# Patient Record
Sex: Female | Born: 1935 | Race: White | Hispanic: No | Marital: Single | Smoking: Never smoker
Health system: Southern US, Community
[De-identification: ages and names within clinical notes are randomized; demographics above are authoritative.]

## PROBLEM LIST (undated history)

## (undated) DIAGNOSIS — C50919 Malignant neoplasm of unspecified site of unspecified female breast: Secondary | ICD-10-CM

## (undated) DIAGNOSIS — I10 Essential (primary) hypertension: Secondary | ICD-10-CM

## (undated) DIAGNOSIS — D689 Coagulation defect, unspecified: Secondary | ICD-10-CM

## (undated) DIAGNOSIS — E785 Hyperlipidemia, unspecified: Secondary | ICD-10-CM

## (undated) DIAGNOSIS — H269 Unspecified cataract: Secondary | ICD-10-CM

## (undated) DIAGNOSIS — J189 Pneumonia, unspecified organism: Secondary | ICD-10-CM

## (undated) DIAGNOSIS — E119 Type 2 diabetes mellitus without complications: Secondary | ICD-10-CM

## (undated) DIAGNOSIS — K219 Gastro-esophageal reflux disease without esophagitis: Secondary | ICD-10-CM

## (undated) DIAGNOSIS — K635 Polyp of colon: Secondary | ICD-10-CM

## (undated) DIAGNOSIS — D126 Benign neoplasm of colon, unspecified: Secondary | ICD-10-CM

## (undated) DIAGNOSIS — H409 Unspecified glaucoma: Secondary | ICD-10-CM

## (undated) DIAGNOSIS — I2699 Other pulmonary embolism without acute cor pulmonale: Secondary | ICD-10-CM

## (undated) DIAGNOSIS — J45909 Unspecified asthma, uncomplicated: Secondary | ICD-10-CM

## (undated) DIAGNOSIS — N189 Chronic kidney disease, unspecified: Secondary | ICD-10-CM

## (undated) DIAGNOSIS — E1169 Type 2 diabetes mellitus with other specified complication: Secondary | ICD-10-CM

## (undated) DIAGNOSIS — M810 Age-related osteoporosis without current pathological fracture: Secondary | ICD-10-CM

## (undated) HISTORY — PX: SKIN CANCER EXCISION: SHX779

## (undated) HISTORY — DX: Chronic kidney disease, unspecified: N18.9

## (undated) HISTORY — DX: Hyperlipidemia, unspecified: E78.5

## (undated) HISTORY — DX: Coagulation defect, unspecified: D68.9

## (undated) HISTORY — DX: Unspecified glaucoma: H40.9

## (undated) HISTORY — DX: Type 2 diabetes mellitus without complications: E11.9

## (undated) HISTORY — DX: Other pulmonary embolism without acute cor pulmonale: I26.99

## (undated) HISTORY — PX: APPENDECTOMY: SHX54

## (undated) HISTORY — PX: TONSILLECTOMY: SUR1361

## (undated) HISTORY — DX: Type 2 diabetes mellitus with other specified complication: E11.69

## (undated) HISTORY — DX: Essential (primary) hypertension: I10

## (undated) HISTORY — DX: Age-related osteoporosis without current pathological fracture: M81.0

## (undated) HISTORY — DX: Gastro-esophageal reflux disease without esophagitis: K21.9

## (undated) HISTORY — PX: CATARACT EXTRACTION, BILATERAL: SHX1313

## (undated) HISTORY — PX: MASTECTOMY: SHX3

## (undated) HISTORY — DX: Pneumonia, unspecified organism: J18.9

## (undated) HISTORY — DX: Polyp of colon: K63.5

## (undated) HISTORY — DX: Malignant neoplasm of unspecified site of unspecified female breast: C50.919

## (undated) HISTORY — DX: Unspecified asthma, uncomplicated: J45.909

## (undated) HISTORY — DX: Unspecified cataract: H26.9

---
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
## 1979-10-21 HISTORY — PX: VAGINAL HYSTERECTOMY: SUR661

## 2014-10-18 DIAGNOSIS — C4492 Squamous cell carcinoma of skin, unspecified: Secondary | ICD-10-CM

## 2014-10-18 HISTORY — DX: Squamous cell carcinoma of skin, unspecified: C44.92

## 2015-10-21 DIAGNOSIS — D126 Benign neoplasm of colon, unspecified: Secondary | ICD-10-CM

## 2015-10-21 HISTORY — DX: Benign neoplasm of colon, unspecified: D12.6

## 2016-04-11 ENCOUNTER — Telehealth: Payer: Self-pay | Admitting: Gastroenterology

## 2016-04-11 NOTE — Telephone Encounter (Signed)
Received GI records from Dr. West Carbo. Patient states that she is due for another colonoscopy and patient is requesting to see Dr. Fuller Plan. Records placed on Dr. Lynne Leader desk for review.

## 2016-04-14 ENCOUNTER — Encounter: Payer: Self-pay | Admitting: Gastroenterology

## 2016-04-14 NOTE — Telephone Encounter (Signed)
Dr. Fuller Plan reviewed records and has accepted patient. Ok to schedule Direct Colon. Colonoscopy scheduled.

## 2016-06-12 ENCOUNTER — Ambulatory Visit (AMBULATORY_SURGERY_CENTER): Payer: Self-pay

## 2016-06-12 VITALS — Ht 65.0 in | Wt 178.8 lb

## 2016-06-12 DIAGNOSIS — Z860100 Personal history of colon polyps, unspecified: Secondary | ICD-10-CM

## 2016-06-12 MED ORDER — SUPREP BOWEL PREP KIT 17.5-3.13-1.6 GM/177ML PO SOLN: 1.0000 | Freq: Once | ORAL | 0 refills | Status: AC

## 2016-06-12 NOTE — Progress Notes (Signed)
No allergies to eggs or soy No diet meds No home oxygen No past problems with anesthesia  No internet

## 2016-06-26 ENCOUNTER — Ambulatory Visit (AMBULATORY_SURGERY_CENTER): Payer: Medicare Other | Admitting: Gastroenterology

## 2016-06-26 ENCOUNTER — Encounter: Payer: Self-pay | Admitting: Gastroenterology

## 2016-06-26 VITALS — BP 131/41 | HR 62 | Temp 96.0°F | Resp 19 | Ht 65.0 in | Wt 178.0 lb

## 2016-06-26 DIAGNOSIS — D122 Benign neoplasm of ascending colon: Secondary | ICD-10-CM

## 2016-06-26 DIAGNOSIS — D128 Benign neoplasm of rectum: Secondary | ICD-10-CM

## 2016-06-26 DIAGNOSIS — Z86010 Personal history of colonic polyps: Secondary | ICD-10-CM | POA: Diagnosis present

## 2016-06-26 DIAGNOSIS — K621 Rectal polyp: Secondary | ICD-10-CM

## 2016-06-26 DIAGNOSIS — D129 Benign neoplasm of anus and anal canal: Secondary | ICD-10-CM

## 2016-06-26 MED ORDER — SODIUM CHLORIDE 0.9 % IV SOLN: 500.0000 mL | INTRAVENOUS | Status: DC

## 2016-06-26 NOTE — Progress Notes (Signed)
Called to room to assist during endoscopic procedure.  Patient ID and intended procedure confirmed with present staff. Received instructions for my participation in the procedure from the performing physician.  

## 2016-06-26 NOTE — Progress Notes (Signed)
Report to PACU, RN, vss, BBS= Clear.  

## 2016-06-26 NOTE — Progress Notes (Signed)
Pt passed a little gas in the recovery room.  Her abdomen is soft and she denies any pain.  Per Dr. Fuller Plan ok to d/c pt to home.  Pt will ambulate, drink warm fluids and bearing down to pass flatus.  maw

## 2016-06-26 NOTE — Patient Instructions (Signed)
YOU HAD AN ENDOSCOPIC PROCEDURE TODAY AT [facility]:   Refer to the procedure report that was given to you for any specific questions about what was found during the examination.  If the procedure report does not answer your questions, please call your gastroenterologist to clarify.  If you requested that your care partner not be given the details of your procedure findings, then the procedure report has been included in a sealed envelope for you to review at your convenience later.  YOU SHOULD EXPECT: Some feelings of bloating in the abdomen. Passage of more gas than usual.  Walking can help get rid of the air that was put into your GI tract during the procedure and reduce the bloating. If you had a lower endoscopy (such as a colonoscopy or flexible sigmoidoscopy) you may notice spotting of blood in your stool or on the toilet paper. If you underwent a bowel prep for your procedure, you may not have a normal bowel movement for a few days.  Please Note:  You might notice some irritation and congestion in your nose or some drainage.  This is from the oxygen used during your procedure.  There is no need for concern and it should clear up in a day or so.  SYMPTOMS TO REPORT IMMEDIATELY:   Following lower endoscopy (colonoscopy or flexible sigmoidoscopy):  Excessive amounts of blood in the stool  Significant tenderness or worsening of abdominal pains  Swelling of the abdomen that is new, acute  Fever of 100F or higher   Following upper endoscopy (EGD)  Vomiting of blood or coffee ground material  New chest pain or pain under the shoulder blades  Painful or persistently difficult swallowing  New shortness of breath  Fever of 100F or higher  Black, tarry-looking stools  For urgent or emergent issues, a gastroenterologist can be reached at any hour by calling [phone].   DIET:  We do recommend a small meal at first, but then you may proceed to your regular diet.  Drink  plenty of fluids but you should avoid alcoholic beverages for 24 hours.  ACTIVITY:  You should plan to take it easy for the rest of today and you should NOT DRIVE or use heavy machinery until tomorrow (because of the sedation medicines used during the test).    FOLLOW UP: Our staff will call the number listed on your records the next business day following your procedure to check on you and address any questions or concerns that you may have regarding the information given to you following your procedure. If we do not reach you, we will leave a message.  However, if you are feeling well and you are not experiencing any problems, there is no need to return our call.  We will assume that you have returned to your regular daily activities without incident.  If any biopsies were taken you will be contacted by phone or by letter within the next 1-3 weeks.  Please call us at [phone] if you have not heard about the biopsies in 3 weeks.    SIGNATURES/CONFIDENTIALITY: You and/or your care partner have signed paperwork which will be entered into your electronic medical record.  These signatures attest to the fact that that the information above on your After Visit Summary has been reviewed and is understood.  Full responsibility of the confidentiality of this discharge information lies with you and/or your care-partner.   Handout was given to your care partner on polyps. No  aspirin, aspirin products,  ibuprofen, naproxen, advil, motrin, aleve, or other non-steroidal anti-inflammatory drugs for 14 days after polyp removal. You may resume your other current medications today. Await biopsy results. Please call if any questions or concerns.

## 2016-06-26 NOTE — Op Note (Signed)
[facility] Patient Name: Kristen Fox Procedure Date: 06/26/2016 9:07 AM MRN: [id number] Endoscopist: Ladene Artist , MD Age: 82 Referring MD:  Date of Birth: 1934-04-06 Gender: Female Account #: [id number] Procedure:                Colonoscopy Indications:              High risk colon cancer surveillance: Personal                            history of adenoma (10 mm or greater in size) Medicines:                Monitored Anesthesia Care Procedure:                Pre-Anesthesia Assessment:                           - Prior to the procedure, a History and Physical                            was performed, and patient medications and                            allergies were reviewed. The patient's tolerance of                            previous anesthesia was also reviewed. The risks                            and benefits of the procedure and the sedation                            options and risks were discussed with the patient.                            All questions were answered, and informed consent                            was obtained. Prior Anticoagulants: The patient has                            taken no previous anticoagulant or antiplatelet                            agents. ASA Grade Assessment: II - A patient with                            mild systemic disease. After reviewing the risks                            and benefits, the patient was deemed in                            satisfactory condition to undergo the procedure.  After obtaining informed consent, the colonoscope                            was passed under direct vision. Throughout the                            procedure, the patient's blood pressure, pulse, and                            oxygen saturations were monitored continuously. The                            Model PCF-H190L [id number]) scope was introduced                            through the anus  and advanced to the the cecum,                            identified by appendiceal orifice and ileocecal                            valve. The ileocecal valve, appendiceal orifice,                            and rectum were photographed. The quality of the                            bowel preparation was excellent. The colonoscopy                            was performed without difficulty. The patient                            tolerated the procedure well. Scope In: 9:15:18 AM Scope Out: 9:29:54 AM Scope Withdrawal Time: 0 hours 11 minutes 58 seconds  Total Procedure Duration: 0 hours 14 minutes 36 seconds  Findings:                 A 22 mm polyp was found in the ascending colon                            immediately distal to the IC valve. The polyp was                            sessile. The polyp was removed with a hot snare.                            Resection and retrieval were complete. Area was                            tattooed with an injection of 2 mL of Spot (carbon  black) immediately distal to the polyectomy site.                           A 5 mm polyp was found in the rectum. The polyp was                            sessile. The polyp was removed with a cold snare.                            Resection and retrieval were complete.                           A 5 mm scar was found in the rectum. The scar was                            unremarkable in appearance.                           A single small localized angiodysplastic lesion                            without bleeding was found in the transverse colon.                           The exam was otherwise normal throughout the                            examined colon.                           The retroflexed view of the distal rectum and anal                            verge was normal and showed no anal or rectal                            abnormalities. Complications:            No  immediate complications. Estimated blood loss:                            None. Estimated Blood Loss:     Estimated blood loss: none. Impression:               - One 22 mm polyp in the ascending colon, removed                            with a hot snare. Resected and retrieved. Tattooed.                           - One 5 mm polyp in the rectum, removed with a cold                            snare. Resected and retrieved.                           -  Scar in the rectum.                           - A single non-bleeding colonic angiodysplastic                            lesion.                           - The distal rectum and anal verge are normal on                            retroflexion view. Recommendation:           - Repeat colonoscopy in 3 years for surveillance                            pending path review and review patients health                            status.                           - Patient has a contact number available for                            emergencies. The signs and symptoms of potential                            delayed complications were discussed with the                            patient. Return to normal activities tomorrow.                            Written discharge instructions were provided to the                            patient.                           - Resume previous diet.                           - Continue present medications.                           - Await pathology results.                           - No aspirin, ibuprofen, naproxen, or other                            non-steroidal anti-inflammatory drugs for 2 weeks                            after polyp removal. Ladene Artist, MD 06/26/2016 9:36:57 AM This  report has been signed electronically.

## 2016-06-26 NOTE — Progress Notes (Signed)
No problems noted in the recovery room. maw 

## 2016-06-27 ENCOUNTER — Telehealth: Payer: Self-pay | Admitting: *Deleted

## 2016-06-27 NOTE — Telephone Encounter (Signed)
  Follow up Call-  Call back number 06/26/2016  Post procedure Call Back phone  # [phone]  Permission to leave phone message Yes     Patient questions:  Do you have a fever, pain , or abdominal swelling? No. Pain Score  0 *  Have you tolerated food without any problems? Yes.    Have you been able to return to your normal activities? Yes.    Do you have any questions about your discharge instructions: Diet   No. Medications  No. Follow up visit  No.  Do you have questions or concerns about your Care? No.  Actions: * If pain score is 4 or above: No action needed, pain <4.

## 2016-07-02 ENCOUNTER — Encounter: Payer: Self-pay | Admitting: Gastroenterology

## 2017-03-17 ENCOUNTER — Other Ambulatory Visit: Payer: Self-pay | Admitting: Physician Assistant

## 2019-05-30 ENCOUNTER — Encounter: Payer: Self-pay | Admitting: Gastroenterology

## 2019-06-02 ENCOUNTER — Other Ambulatory Visit: Payer: Self-pay | Admitting: Physician Assistant

## 2019-07-21 ENCOUNTER — Ambulatory Visit (INDEPENDENT_AMBULATORY_CARE_PROVIDER_SITE_OTHER): Payer: Medicare Other | Admitting: Gastroenterology

## 2019-07-21 ENCOUNTER — Encounter: Payer: Self-pay | Admitting: Gastroenterology

## 2019-07-21 ENCOUNTER — Telehealth: Payer: Self-pay

## 2019-07-21 VITALS — BP 156/72 | HR 75 | Temp 98.5°F | Ht 65.0 in | Wt 167.0 lb

## 2019-07-21 DIAGNOSIS — Z7901 Long term (current) use of anticoagulants: Secondary | ICD-10-CM | POA: Diagnosis not present

## 2019-07-21 DIAGNOSIS — Z86010 Personal history of colonic polyps: Secondary | ICD-10-CM

## 2019-07-21 MED ORDER — NA SULFATE-K SULFATE-MG SULF 17.5-3.13-1.6 GM/177ML PO SOLN: 1.0000 | Freq: Once | ORAL | 0 refills | Status: AC

## 2019-07-21 NOTE — Progress Notes (Signed)
History of Present Illness: This is an 85 year old female referred by Josem Kaufmann, MD for the evaluation of a personal history of adenomatous colon polyps.  She has had adenomatous polyps of 10 mm found in the past and 22 mm on her most recent colonoscopy.  She has no gastrointestinal complaints.  She is currently maintained on Eliquis for history of a DVT and PE in January.  She has been stable for the past several months.  She followed by hematologist oncologist in [place], [place].   Colonoscopy 06/2016 - One 22 mm polyp in the ascending colon, removed with a hot snare. Resected and retrieved. Tattooed. Tubular adenoma.  - One 5 mm polyp in the rectum, removed with a cold snare. Resected and retrieved. Hyperplastic.  - Scar in the rectum. - A single non-bleeding colonic angiodysplastic lesion. - The distal rectum and anal verge are normal on retroflexion view.   No Known Allergies Outpatient Medications Prior to Visit  Medication Sig Dispense Refill  . amLODipine (NORVASC) 5 MG tablet Take 5 mg by mouth daily.    . brimonidine (ALPHAGAN) 0.2 % ophthalmic solution 3 (three) times daily.    . Calcium Carbonate-Vitamin D (CALCIUM 500 + D) 500-125 MG-UNIT TABS Take by mouth.    . cholecalciferol (VITAMIN D) 1000 units tablet Take 1,000 Units by mouth daily.    Marland Kitchen denosumab (PROLIA) 60 MG/ML SOLN injection Inject 60 mg into the skin every 6 (six) months. Administer in upper arm, thigh, or abdomen    . esomeprazole (NEXIUM) 20 MG packet Take 20 mg by mouth daily before breakfast.    . lisinopril-hydrochlorothiazide (PRINZIDE,ZESTORETIC) 20-25 MG tablet Take 1 tablet by mouth daily.    . montelukast (SINGULAIR) 10 MG tablet Take 10 mg by mouth at bedtime.    Marland Kitchen glyBURIDE micronized (GLYNASE) 6 MG tablet Take 6 mg by mouth 2 (two) times daily before a meal.    . insulin glargine (LANTUS) 100 UNIT/ML injection Inject 40 Units into the skin at bedtime.    . Insulin Lispro (HUMALOG KWIKPEN [place])  Inject 5 Units into the skin.    Marland Kitchen Potassium 99 MG TABS Take by mouth.     Facility-Administered Medications Prior to Visit  Medication Dose Route Frequency Provider Last Rate Last Dose  . 0.9 %  sodium chloride infusion  500 mL Intravenous Continuous Ladene Artist, MD       Past Medical History:  Diagnosis Date  . Asthma due to environmental allergies   . Breast cancer ([facility])    2003  . Diabetes mellitus ([place] [facility])    type II  . Diabetic coma ([place])   . GERD (gastroesophageal reflux disease)   . Glaucoma   . Hypertension   . Osteoporosis   . Pneumonia   . Pulmonary embolism ([place])   . Tubular adenoma of colon 2017   Past Surgical History:  Procedure Laterality Date  . APPENDECTOMY    . CATARACT EXTRACTION, BILATERAL    . MASTECTOMY     left w/ymph node removal  . SKIN CANCER EXCISION    . TONSILLECTOMY    . VAGINAL HYSTERECTOMY  1981   Social History   Socioeconomic History  . Marital status: Single    Spouse name: Not on file  . Number of children: Not on file  . Years of education: Not on file  . Highest education level: Not on file  Occupational History  . Not on file  Social Needs  .  Financial resource strain: Not on file  . Food insecurity    Worry: Not on file    Inability: Not on file  . Transportation needs    Medical: Not on file    Non-medical: Not on file  Tobacco Use  . Smoking status: Never Smoker  . Smokeless tobacco: Never Used  Substance and Sexual Activity  . Alcohol use: No  . Drug use: No  . Sexual activity: Not on file  Lifestyle  . Physical activity    Days per week: Not on file    Minutes per session: Not on file  . Stress: Not on file  Relationships  . Social Herbalist on phone: Not on file    Gets together: Not on file    Attends religious service: Not on file    Active member of club or organization: Not on file    Attends meetings of clubs or organizations: Not on file    Relationship status: Not on file  Other  Topics Concern  . Not on file  Social History Narrative  . Not on file   Family History  Problem Relation Age of Onset  . Diabetes Mother   . CVA Mother   . Leukemia Father   . Colon cancer Neg Hx   . Stomach cancer Neg Hx   . Pancreatic cancer Neg Hx   . Esophageal cancer Neg Hx        Review of Systems: Pertinent positive and negative review of systems were noted in the above HPI section. All other review of systems were otherwise negative.   Physical Exam: General: Well developed, well nourished, no acute distress Head: Normocephalic and atraumatic Eyes:  sclerae anicteric, EOMI Ears: Normal auditory acuity Mouth: No deformity or lesions Neck: Supple, no masses or thyromegaly Lungs: Clear throughout to auscultation Heart: Regular rate and rhythm; no murmurs, rubs or bruits Abdomen: Soft, non tender and non distended. No masses, hepatosplenomegaly or hernias noted. Normal Bowel sounds Rectal: deferred to colonoscopy Musculoskeletal: Symmetrical with no gross deformities  Skin: No lesions on visible extremities Pulses:  Normal pulses noted Extremities: No clubbing, cyanosis, edema or deformities noted Neurological: Alert oriented x 4, grossly nonfocal Cervical Nodes:  No significant cervical adenopathy Inguinal Nodes: No significant inguinal adenopathy Psychological:  Alert and cooperative. Normal mood and affect   Assessment and Recommendations:  1. Personal history of larger adenomatous colon polyps.  Recommend surveillance colonoscopy. Schedule colonoscopy. The risks (including bleeding, perforation, infection, missed lesions, medication reactions and possible hospitalization or surgery if complications occur), benefits, and alternatives to colonoscopy with possible biopsy and possible polypectomy were discussed with the patient and they consent to proceed.  Timing may need to be adjusted pending clearance to temporarily hold Eliquis.    2.  History of PE and DVT.   Hold Eliquis 2 days before procedure - will instruct when and how to resume after procedure. Low but real risk of cardiovascular event such as heart attack, stroke, embolism, thrombosis or ischemia/infarct of other organs off Eliquis explained and need to seek urgent help if this occurs. The patient consents to proceed. Will communicate by phone or EMR with patient's prescribing provider to confirm that holding Eliquis is reasonable in this case.     cc: Josem Kaufmann, MD [address] [place] [address]

## 2019-07-21 NOTE — Telephone Encounter (Signed)
Faxed clearance to Dr. Junius Roads at [facility] oncology [phone]) Attention Manuela Schwartz. Waiting on fax clearance back.

## 2019-07-21 NOTE — Patient Instructions (Signed)
You have been scheduled for a colonoscopy. Please follow written instructions given to you at your visit today.  Please pick up your prep supplies at the pharmacy within the next 1-3 days. If you use inhalers (even only as needed), please bring them with you on the day of your procedure. Your physician has requested that you go to www.startemmi.com and enter the access code given to you at your visit today. This web site gives a general overview about your procedure. However, you should still follow specific instructions given to you by our office regarding your preparation for the procedure.  Thank you for choosing me and [facility].  Malcolm T. Stark, Jr., MD., FACG  

## 2019-07-22 ENCOUNTER — Telehealth: Payer: Self-pay | Admitting: Gastroenterology

## 2019-07-22 NOTE — Telephone Encounter (Signed)
Informed patient per Dr. Junius Roads to hold Eliquis 3 days prior to her procedure. Patient verbalized understanding.

## 2019-07-22 NOTE — Telephone Encounter (Signed)
Pt called again, she is requesting a sample of suprep, she would like an answer today because one of her relatives is in [place] today and can pick it up at the office. Pls call her.

## 2019-07-22 NOTE — Telephone Encounter (Signed)
Informed patient we do not have a sample of Suprep at this time but we can contact her when we get a sample in the office. Patient verbalized understanding.

## 2019-07-22 NOTE — Telephone Encounter (Signed)
Received fax back from Dr. Junius Roads stating he would like patient to hold her Eliquis 3 days prior to her colonoscopy. Is this ok Dr. Fuller Plan?

## 2019-07-22 NOTE — Telephone Encounter (Signed)
OK with me.

## 2019-08-01 ENCOUNTER — Telehealth: Payer: Self-pay | Admitting: Gastroenterology

## 2019-08-01 NOTE — Telephone Encounter (Signed)
See previous phone note. Informed patient that we have not received samples of Suprep yet but will contact her when we do.

## 2019-08-04 NOTE — Telephone Encounter (Signed)
Patient called back and states she cannot get any one to pick up the prep until 08/10/19 and needs to reschedule the procedure. Rescheduled Colon until 08/11/19 at 11:30am. Informed patient I will still contact her when her prep comes in.

## 2019-08-04 NOTE — Telephone Encounter (Signed)
Informed patient that drug rep for Suprep states the sample free preps will not be delivered until Friday afternoon at the earliest so it could be Friday evening or Monday before someone can pick it up. Patient states she might reschedule the Colonoscopy since she is not sure if she can get her daughter to come pick it up Friday or Monday. Informed patient that's her choice but we can give her a sample if she can find someone to pick up the prep. Also, we could give her a new prep with different instructions but still has to have someone to get the new instructions. Patient states she will talk with her daughter and to call her tomorrow when the prep samples come in.

## 2019-08-04 NOTE — Telephone Encounter (Signed)
Left message for patient to return my call.

## 2019-08-05 NOTE — Telephone Encounter (Signed)
Informed patient that her sample will be at the front desk when she or a family member can pick it up. Patient verbalized understanding.

## 2019-08-09 ENCOUNTER — Encounter: Payer: Medicare Other | Admitting: Gastroenterology

## 2019-08-10 ENCOUNTER — Telehealth: Payer: Self-pay | Admitting: Gastroenterology

## 2019-08-10 NOTE — Telephone Encounter (Signed)

## 2019-08-11 ENCOUNTER — Encounter: Payer: Self-pay | Admitting: Gastroenterology

## 2019-08-11 ENCOUNTER — Ambulatory Visit: Payer: Medicare Other | Admitting: Gastroenterology

## 2019-08-11 ENCOUNTER — Encounter: Payer: Self-pay | Admitting: *Deleted

## 2019-08-11 ENCOUNTER — Other Ambulatory Visit: Payer: Self-pay

## 2019-08-11 VITALS — BP 154/65 | HR 54 | Temp 98.5°F | Ht 65.0 in | Wt 167.0 lb

## 2019-08-11 MED ORDER — SODIUM CHLORIDE 0.9 % IV SOLN: 500.0000 mL | Freq: Once | INTRAVENOUS | Status: DC

## 2019-08-11 NOTE — Progress Notes (Signed)
Pt's states no medical or surgical changes since previsit or office visit.  Vs by CW in [place] covid questions by JB ,front desk

## 2019-08-17 ENCOUNTER — Telehealth: Payer: Self-pay

## 2019-08-17 NOTE — Telephone Encounter (Signed)
Covid-19 screening questions   Do you now or have you had a fever in the last 14 days? NO   Do you have any respiratory symptoms of shortness of breath or cough now or in the last 14 days? NO  Do you have any family members or close contacts with diagnosed or suspected Covid-19 in the past 14 days? NO  Have you been tested for Covid-19 and found to be positive? NO        

## 2019-08-18 ENCOUNTER — Other Ambulatory Visit: Payer: Self-pay | Admitting: Gastroenterology

## 2019-08-18 ENCOUNTER — Encounter: Payer: Self-pay | Admitting: Gastroenterology

## 2019-08-18 ENCOUNTER — Ambulatory Visit (AMBULATORY_SURGERY_CENTER): Payer: Medicare Other | Admitting: Gastroenterology

## 2019-08-18 ENCOUNTER — Other Ambulatory Visit: Payer: Self-pay

## 2019-08-18 VITALS — BP 148/88 | HR 58 | Temp 98.3°F | Resp 14 | Ht 65.0 in | Wt 167.0 lb

## 2019-08-18 DIAGNOSIS — Z86010 Personal history of colonic polyps: Secondary | ICD-10-CM

## 2019-08-18 DIAGNOSIS — D120 Benign neoplasm of cecum: Secondary | ICD-10-CM | POA: Diagnosis not present

## 2019-08-18 MED ORDER — SODIUM CHLORIDE 0.9 % IV SOLN: 500.0000 mL | INTRAVENOUS | Status: DC

## 2019-08-18 NOTE — Patient Instructions (Signed)
Thank you for allowing [place] to care for you today!  Resume your Eliquis in 2 days (Saturday 10/31) at prior dose.   Avoid NSAIDS ( Ibuprofen, Aspirin, Naproxen) for 2 weeks.   ( November 13)  No more routine colonoscopies due to current age guidelines.  Don't hesitate to contact Dr Fuller Plan if any symptoms or issues arise.  Resume previous diet and medications today.  Return to your normal activities tomorrow.   YOU HAD AN ENDOSCOPIC PROCEDURE TODAY AT [facility]:   Refer to the procedure report that was given to you for any specific questions about what was found during the examination.  If the procedure report does not answer your questions, please call your gastroenterologist to clarify.  If you requested that your care partner not be given the details of your procedure findings, then the procedure report has been included in a sealed envelope for you to review at your convenience later.  YOU SHOULD EXPECT: Some feelings of bloating in the abdomen. Passage of more gas than usual.  Walking can help get rid of the air that was put into your GI tract during the procedure and reduce the bloating. If you had a lower endoscopy (such as a colonoscopy or flexible sigmoidoscopy) you may notice spotting of blood in your stool or on the toilet paper. If you underwent a bowel prep for your procedure, you may not have a normal bowel movement for a few days.  Please Note:  You might notice some irritation and congestion in your nose or some drainage.  This is from the oxygen used during your procedure.  There is no need for concern and it should clear up in a day or so.  SYMPTOMS TO REPORT IMMEDIATELY:   Following lower endoscopy (colonoscopy or flexible sigmoidoscopy):  Excessive amounts of blood in the stool  Significant tenderness or worsening of abdominal pains  Swelling of the abdomen that is new, acute  Fever of 100F or higher   For urgent or emergent issues, a gastroenterologist  can be reached at any hour by calling [phone].   DIET:  We do recommend a small meal at first, but then you may proceed to your regular diet.  Drink plenty of fluids but you should avoid alcoholic beverages for 24 hours.  ACTIVITY:  You should plan to take it easy for the rest of today and you should NOT DRIVE or use heavy machinery until tomorrow (because of the sedation medicines used during the test).    FOLLOW UP: Our staff will call the number listed on your records 48-72 hours following your procedure to check on you and address any questions or concerns that you may have regarding the information given to you following your procedure. If we do not reach you, we will leave a message.  We will attempt to reach you two times.  During this call, we will ask if you have developed any symptoms of COVID 19. If you develop any symptoms (ie: fever, flu-like symptoms, shortness of breath, cough etc.) before then, please call [phone].  If you test positive for Covid 19 in the 2 weeks post procedure, please call and report this information to [place].    If any biopsies were taken you will be contacted by phone or by letter within the next 1-3 weeks.  Please call us at [phone] if you have not heard about the biopsies in 3 weeks.    SIGNATURES/CONFIDENTIALITY: You and/or your care partner have  signed paperwork which will be entered into your electronic medical record.  These signatures attest to the fact that that the information above on your After Visit Summary has been reviewed and is understood.  Full responsibility of the confidentiality of this discharge information lies with you and/or your care-partner.

## 2019-08-18 NOTE — Progress Notes (Signed)
To PACU< VSS. Report to [email or web address] 

## 2019-08-18 NOTE — Progress Notes (Signed)
Temp JB V/S CW 

## 2019-08-18 NOTE — Op Note (Signed)
[facility] Patient Name: Kristen Fox Procedure Date: 08/18/2019 11:15 AM MRN: [id number] Endoscopist: Ladene Artist , MD Age: 85 Referring MD:  Date of Birth: 1934-07-26 Gender: Female Account #: [id number] Procedure:                Colonoscopy Indications:              Surveillance: Personal history of adenomatous                            polyps on last colonoscopy 3 years ago Medicines:                Monitored Anesthesia Care Procedure:                Pre-Anesthesia Assessment:                           - Prior to the procedure, a History and Physical                            was performed, and patient medications and                            allergies were reviewed. The patient's tolerance of                            previous anesthesia was also reviewed. The risks                            and benefits of the procedure and the sedation                            options and risks were discussed with the patient.                            All questions were answered, and informed consent                            was obtained. Prior Anticoagulants: The patient has                            taken Eliquis (apixaban), last dose was 2 days                            prior to procedure. ASA Grade Assessment: III - A                            patient with severe systemic disease. After                            reviewing the risks and benefits, the patient was                            deemed in satisfactory condition to undergo the  procedure.                           After obtaining informed consent, the colonoscope                            was passed under direct vision. Throughout the                            procedure, the patient's blood pressure, pulse, and                            oxygen saturations were monitored continuously. The                            Colonoscope was introduced through the anus and                      advanced to the the cecum, identified by                            appendiceal orifice and ileocecal valve. The                            ileocecal valve, appendiceal orifice, and rectum                            were photographed. The quality of the bowel                            preparation was good. The colonoscopy was performed                            without difficulty. The patient tolerated the                            procedure well. Scope In: 11:17:59 AM Scope Out: 11:33:27 AM Scope Withdrawal Time: 0 hours 10 minutes 39 seconds  Total Procedure Duration: 0 hours 15 minutes 28 seconds  Findings:                 The perianal and digital rectal examinations were                            normal.                           A 7 mm polyp was found in the cecum. The polyp was                            sessile. The polyp was removed with a cold snare.                            Resection and retrieval were complete.  A tattoo was seen at the ileocecal valve. A                            post-polypectomy scar was found at the tattoo site.                            There was no evidence of residual polyp tissue.                           Multiple medium-mouthed diverticula were found in                            the left colon. There was no evidence of                            diverticular bleeding.                           Internal hemorrhoids were found during                            retroflexion. The hemorrhoids were small and Grade                            I (internal hemorrhoids that do not prolapse).                           The exam was otherwise without abnormality on                            direct and retroflexion views. Complications:            No immediate complications. Estimated blood loss:                            None. Estimated Blood Loss:     Estimated blood loss: none. Impression:               -  One 7 mm polyp in the cecum, removed with a cold                            snare. Resected and retrieved.                           - A tattoo was seen at the ileocecal valve. A                            post-polypectomy scar was found at the tattoo site.                            There was no evidence of residual polyp tissue.                           - Mild diverticulosis in the left colon.                           -  Internal hemorrhoids.                           - The examination was otherwise normal on direct                            and retroflexion views. Recommendation:           - Resume Eliquis (apixaban) in 2 days at prior                            dose. Refer to managing physician for further                            adjustment of therapy.                           - Patient has a contact number available for                            emergencies. The signs and symptoms of potential                            delayed complications were discussed with the                            patient. Return to normal activities tomorrow.                            Written discharge instructions were provided to the                            patient.                           - Resume previous diet.                           - Continue present medications.                           - Await pathology results.                           - No aspirin, ibuprofen, naproxen, or other                            non-steroidal anti-inflammatory drugs for 2 weeks                            after polyp removal.                           - No repeat colonoscopy due to age. Ladene Artist, MD 08/18/2019 11:39:20 AM This report has been signed electronically.

## 2019-08-22 ENCOUNTER — Telehealth: Payer: Self-pay

## 2019-08-22 NOTE — Telephone Encounter (Signed)
  Follow up Call-  Call back number 08/18/2019 08/11/2019  Post procedure Call Back phone  # [phone]  Permission to leave phone message Yes Yes  Some recent data might be hidden     Patient questions:  Do you have a fever, pain , or abdominal swelling? No. Pain Score  0 *  Have you tolerated food without any problems? Yes.    Have you been able to return to your normal activities? Yes.    Do you have any questions about your discharge instructions: Diet   No. Medications  No. Follow up visit  No.  Do you have questions or concerns about your Care? No.  Actions: * If pain score is 4 or above: 1. No action needed, pain <4.Have you developed a fever since your procedure? no  2.   Have you had an respiratory symptoms (SOB or cough) since your procedure? no  3.   Have you tested positive for COVID 19 since your procedure no  4.   Have you had any family members/close contacts diagnosed with the COVID 19 since your procedure?  no   If yes to any of these questions please route to Joylene John, RN and Alphonsa Gin, Therapist, sports.

## 2019-08-25 ENCOUNTER — Encounter: Payer: Self-pay | Admitting: Gastroenterology

## 2019-12-06 ENCOUNTER — Other Ambulatory Visit: Payer: Self-pay | Admitting: Physician Assistant

## 2020-04-24 ENCOUNTER — Ambulatory Visit: Payer: Medicare Other | Admitting: Physician Assistant

## 2020-04-25 ENCOUNTER — Encounter: Payer: Self-pay | Admitting: *Deleted

## 2020-05-08 ENCOUNTER — Ambulatory Visit (INDEPENDENT_AMBULATORY_CARE_PROVIDER_SITE_OTHER): Payer: Medicare Other | Admitting: Physician Assistant

## 2020-05-08 ENCOUNTER — Encounter: Payer: Self-pay | Admitting: Physician Assistant

## 2020-05-08 ENCOUNTER — Other Ambulatory Visit: Payer: Self-pay

## 2020-05-08 DIAGNOSIS — C4441 Basal cell carcinoma of skin of scalp and neck: Secondary | ICD-10-CM | POA: Diagnosis not present

## 2020-05-08 DIAGNOSIS — L570 Actinic keratosis: Secondary | ICD-10-CM | POA: Diagnosis not present

## 2020-05-08 DIAGNOSIS — Z1283 Encounter for screening for malignant neoplasm of skin: Secondary | ICD-10-CM | POA: Diagnosis not present

## 2020-05-08 DIAGNOSIS — L820 Inflamed seborrheic keratosis: Secondary | ICD-10-CM

## 2020-05-08 DIAGNOSIS — C4491 Basal cell carcinoma of skin, unspecified: Secondary | ICD-10-CM

## 2020-05-08 HISTORY — DX: Basal cell carcinoma of skin, unspecified: C44.91

## 2020-05-08 NOTE — Patient Instructions (Signed)

## 2020-05-08 NOTE — Progress Notes (Addendum)
   Follow-Up Visit   Subjective  Kristen Fox is a 86 y.o. female who presents for the following: Annual Exam (left jawline-x 1 month-no itch, no bleed unless I pick it).   The following portions of the chart were reviewed this encounter and updated as appropriate: Tobacco  Allergies  Meds  Problems  Med Hx  Surg Hx  Fam Hx      Objective  Well appearing patient in no apparent distress; mood and affect are within normal limits.  All skin waist up examined. Plus legs  Objective  Left Buccal Cheek : Erythematous patches with gritty scale.  Objective  Right Lower Leg - Anterior: Erythematous stuck-on, waxy papule or plaque.   Objective  head to toe: No atypical nevi   Objective  Anterior Mid Neck: Pearly papule with telangectasia.      Assessment & Plan  AK (actinic keratosis) Left Buccal Cheek   Destruction of lesion - Left Buccal Cheek  Complexity: simple   Destruction method: cryotherapy   Informed consent: discussed and consent obtained   Timeout:  patient name, date of birth, surgical site, and procedure verified Lesion destroyed using liquid nitrogen: Yes   Cryotherapy cycles:  1 Outcome: patient tolerated procedure well with no complications   Post-procedure details: wound care instructions given    Inflamed seborrheic keratosis Right Lower Leg - Anterior  observe  Screening exam for skin cancer head to toe  Nodular basal cell carcinoma (BCC) Anterior Mid Neck  Skin / nail biopsy Type of biopsy: tangential   Informed consent: discussed and consent obtained   Timeout: patient name, date of birth, surgical site, and procedure verified   Anesthesia: the lesion was anesthetized in a standard fashion   Anesthetic:  1% lidocaine w/ epinephrine 1-100,000 local infiltration Instrument used: flexible razor blade   Hemostasis achieved with: aluminum chloride and electrodesiccation   Outcome: patient tolerated procedure well   Post-procedure  details: wound care instructions given    Specimen 1 - Surgical pathology Differential Diagnosis: BCC/ MM Check Margins: yes   I, Judge Duque, PA-C, have reviewed all documentation's for this visit.  The documentation on 05/19/20 for the exam, diagnosis, procedures and orders are all accurate and complete.

## 2020-05-14 ENCOUNTER — Telehealth: Payer: Self-pay | Admitting: Physician Assistant

## 2020-05-14 ENCOUNTER — Encounter: Payer: Self-pay | Admitting: Physician Assistant

## 2020-05-14 ENCOUNTER — Telehealth: Payer: Self-pay

## 2020-05-14 NOTE — Telephone Encounter (Signed)
Patient calling for biopsy results

## 2020-05-14 NOTE — Telephone Encounter (Signed)
PATHOLOGY GIVEN TO PATIENT SURGERY MADE.

## 2020-05-14 NOTE — Telephone Encounter (Signed)
-----   Message from Warren Danes, [place] sent at 05/10/2020 11:15 AM EDT ----- Schedule surgery

## 2020-05-14 NOTE — Telephone Encounter (Signed)
Pathology given to patient

## 2020-05-25 ENCOUNTER — Telehealth: Payer: Self-pay | Admitting: Physician Assistant

## 2020-05-29 NOTE — Telephone Encounter (Signed)
Error

## 2020-06-07 ENCOUNTER — Ambulatory Visit (INDEPENDENT_AMBULATORY_CARE_PROVIDER_SITE_OTHER): Payer: Medicare Other | Admitting: Physician Assistant

## 2020-06-07 ENCOUNTER — Other Ambulatory Visit: Payer: Self-pay

## 2020-06-07 ENCOUNTER — Encounter: Payer: Self-pay | Admitting: Physician Assistant

## 2020-06-07 DIAGNOSIS — C4441 Basal cell carcinoma of skin of scalp and neck: Secondary | ICD-10-CM | POA: Diagnosis not present

## 2020-06-07 DIAGNOSIS — L570 Actinic keratosis: Secondary | ICD-10-CM | POA: Diagnosis not present

## 2020-06-07 NOTE — Patient Instructions (Signed)

## 2020-06-07 NOTE — Progress Notes (Signed)
   Follow-Up Visit   Subjective  Kristen Fox is a 86 y.o. female who presents for the following: Procedure ( BCC ON NECK--left side).   The following portions of the chart were reviewed this encounter and updated as appropriate: Tobacco  Allergies  Meds  Problems  Med Hx  Surg Hx  Fam Hx      Objective  Well appearing patient in no apparent distress; mood and affect are within normal limits.  A focused examination was performed including neck. Relevant physical exam findings are noted in the Assessment and Plan.  Objective  Neck - Anterior: Pink macule  Objective  Left Anterior Neck (6): Erythematous patches with gritty scale.  Assessment & Plan  Basal cell carcinoma of skin of scalp and neck Neck - Anterior  Destruction of lesion Complexity: simple   Destruction method: electrodesiccation and curettage   Informed consent: discussed and consent obtained   Timeout:  patient name, date of birth, surgical site, and procedure verified Anesthesia: the lesion was anesthetized in a standard fashion   Anesthetic:  1% lidocaine w/ epinephrine 1-100,000 local infiltration Curettage performed in three different directions: Yes   Electrodesiccation performed over the curetted area: Yes   Curettage cycles:  3 Margin per side (cm):  0.1 Final wound size (cm):  1.2 Hemostasis achieved with:  aluminum chloride Outcome: patient tolerated procedure well with no complications   Post-procedure details: wound care instructions given    AK (actinic keratosis) (6) Left Anterior Neck  Destruction of lesion - Left Anterior Neck Complexity: simple   Destruction method: cryotherapy   Informed consent: discussed and consent obtained   Timeout:  patient name, date of birth, surgical site, and procedure verified Lesion destroyed using liquid nitrogen: Yes   Outcome: patient tolerated procedure well with no complications     I, Janylah Belgrave, PA-C, have reviewed all  documentation's for this visit.  The documentation on 06/07/20 for the exam, diagnosis, procedures and orders are all accurate and complete.

## 2020-11-17 ENCOUNTER — Encounter (HOSPITAL_COMMUNITY): Payer: Self-pay | Admitting: Emergency Medicine

## 2020-11-17 ENCOUNTER — Inpatient Hospital Stay (HOSPITAL_COMMUNITY)
Admission: EM | Admit: 2020-11-17 | Discharge: 2020-11-21 | DRG: 982 | Disposition: A | Discharge status: Home / Self Care | Payer: Medicare Other | Attending: Internal Medicine | Admitting: Internal Medicine

## 2020-11-17 ENCOUNTER — Other Ambulatory Visit: Payer: Self-pay

## 2020-11-17 DIAGNOSIS — Z823 Family history of stroke: Secondary | ICD-10-CM | POA: Diagnosis not present

## 2020-11-17 DIAGNOSIS — E1122 Type 2 diabetes mellitus with diabetic chronic kidney disease: Secondary | ICD-10-CM | POA: Diagnosis present

## 2020-11-17 DIAGNOSIS — K219 Gastro-esophageal reflux disease without esophagitis: Secondary | ICD-10-CM | POA: Diagnosis present

## 2020-11-17 DIAGNOSIS — I4589 Other specified conduction disorders: Secondary | ICD-10-CM | POA: Diagnosis present

## 2020-11-17 DIAGNOSIS — N184 Chronic kidney disease, stage 4 (severe): Secondary | ICD-10-CM | POA: Diagnosis present

## 2020-11-17 DIAGNOSIS — Z9012 Acquired absence of left breast and nipple: Secondary | ICD-10-CM | POA: Diagnosis not present

## 2020-11-17 DIAGNOSIS — I129 Hypertensive chronic kidney disease with stage 1 through stage 4 chronic kidney disease, or unspecified chronic kidney disease: Secondary | ICD-10-CM | POA: Diagnosis present

## 2020-11-17 DIAGNOSIS — J45909 Unspecified asthma, uncomplicated: Secondary | ICD-10-CM | POA: Diagnosis present

## 2020-11-17 DIAGNOSIS — E1142 Type 2 diabetes mellitus with diabetic polyneuropathy: Secondary | ICD-10-CM | POA: Diagnosis present

## 2020-11-17 DIAGNOSIS — R296 Repeated falls: Secondary | ICD-10-CM | POA: Diagnosis present

## 2020-11-17 DIAGNOSIS — R9431 Abnormal electrocardiogram [ECG] [EKG]: Secondary | ICD-10-CM | POA: Diagnosis not present

## 2020-11-17 DIAGNOSIS — H409 Unspecified glaucoma: Secondary | ICD-10-CM | POA: Diagnosis present

## 2020-11-17 DIAGNOSIS — I495 Sick sinus syndrome: Secondary | ICD-10-CM | POA: Diagnosis present

## 2020-11-17 DIAGNOSIS — E1165 Type 2 diabetes mellitus with hyperglycemia: Principal | ICD-10-CM | POA: Diagnosis present

## 2020-11-17 DIAGNOSIS — I442 Atrioventricular block, complete: Principal | ICD-10-CM | POA: Diagnosis present

## 2020-11-17 DIAGNOSIS — Z6827 Body mass index (BMI) 27.0-27.9, adult: Secondary | ICD-10-CM

## 2020-11-17 DIAGNOSIS — Z86711 Personal history of pulmonary embolism: Secondary | ICD-10-CM | POA: Diagnosis not present

## 2020-11-17 DIAGNOSIS — Z85828 Personal history of other malignant neoplasm of skin: Secondary | ICD-10-CM

## 2020-11-17 DIAGNOSIS — I10 Essential (primary) hypertension: Secondary | ICD-10-CM | POA: Diagnosis not present

## 2020-11-17 DIAGNOSIS — E119 Type 2 diabetes mellitus without complications: Secondary | ICD-10-CM

## 2020-11-17 DIAGNOSIS — I480 Paroxysmal atrial fibrillation: Secondary | ICD-10-CM | POA: Diagnosis present

## 2020-11-17 DIAGNOSIS — R7989 Other specified abnormal findings of blood chemistry: Secondary | ICD-10-CM | POA: Diagnosis present

## 2020-11-17 DIAGNOSIS — E663 Overweight: Secondary | ICD-10-CM | POA: Diagnosis not present

## 2020-11-17 DIAGNOSIS — Z7982 Long term (current) use of aspirin: Secondary | ICD-10-CM

## 2020-11-17 DIAGNOSIS — Z959 Presence of cardiac and vascular implant and graft, unspecified: Secondary | ICD-10-CM

## 2020-11-17 DIAGNOSIS — Z853 Personal history of malignant neoplasm of breast: Secondary | ICD-10-CM

## 2020-11-17 DIAGNOSIS — E11649 Type 2 diabetes mellitus with hypoglycemia without coma: Secondary | ICD-10-CM | POA: Diagnosis present

## 2020-11-17 DIAGNOSIS — Z79899 Other long term (current) drug therapy: Secondary | ICD-10-CM

## 2020-11-17 DIAGNOSIS — R300 Dysuria: Secondary | ICD-10-CM | POA: Diagnosis not present

## 2020-11-17 DIAGNOSIS — R55 Syncope and collapse: Secondary | ICD-10-CM

## 2020-11-17 DIAGNOSIS — M810 Age-related osteoporosis without current pathological fracture: Secondary | ICD-10-CM | POA: Diagnosis present

## 2020-11-17 DIAGNOSIS — Z7901 Long term (current) use of anticoagulants: Secondary | ICD-10-CM

## 2020-11-17 DIAGNOSIS — E785 Hyperlipidemia, unspecified: Secondary | ICD-10-CM | POA: Diagnosis present

## 2020-11-17 DIAGNOSIS — Z20822 Contact with and (suspected) exposure to covid-19: Secondary | ICD-10-CM | POA: Diagnosis present

## 2020-11-17 DIAGNOSIS — I4892 Unspecified atrial flutter: Secondary | ICD-10-CM | POA: Diagnosis present

## 2020-11-17 DIAGNOSIS — Z806 Family history of leukemia: Secondary | ICD-10-CM

## 2020-11-17 DIAGNOSIS — I471 Supraventricular tachycardia: Secondary | ICD-10-CM | POA: Diagnosis present

## 2020-11-17 DIAGNOSIS — Z90710 Acquired absence of both cervix and uterus: Secondary | ICD-10-CM

## 2020-11-17 DIAGNOSIS — Z833 Family history of diabetes mellitus: Secondary | ICD-10-CM

## 2020-11-17 DIAGNOSIS — B9620 Unspecified Escherichia coli [E. coli] as the cause of diseases classified elsewhere: Secondary | ICD-10-CM | POA: Diagnosis present

## 2020-11-17 DIAGNOSIS — Z794 Long term (current) use of insulin: Secondary | ICD-10-CM

## 2020-11-17 DIAGNOSIS — R350 Frequency of micturition: Secondary | ICD-10-CM | POA: Diagnosis not present

## 2020-11-17 DIAGNOSIS — N390 Urinary tract infection, site not specified: Secondary | ICD-10-CM | POA: Diagnosis present

## 2020-11-17 DIAGNOSIS — R531 Weakness: Secondary | ICD-10-CM | POA: Diagnosis present

## 2020-11-17 LAB — BASIC METABOLIC PANEL
Anion gap: 12 (ref 5–15)
BUN: 30 mg/dL — ABNORMAL HIGH (ref 8–23)
CO2: 22 mmol/L (ref 22–32)
Calcium: 10 mg/dL (ref 8.9–10.3)
Chloride: 104 mmol/L (ref 98–111)
Creatinine, Ser: 2.04 mg/dL — ABNORMAL HIGH (ref 0.44–1.00)
GFR, Estimated: 24 mL/min — ABNORMAL LOW (ref >60)
Glucose, Bld: 192 mg/dL — ABNORMAL HIGH (ref 70–99)
Potassium: 5 mmol/L (ref 3.5–5.1)
Sodium: 138 mmol/L (ref 135–145)

## 2020-11-17 LAB — CBG MONITORING, ED
Glucose-Capillary: 183 mg/dL — ABNORMAL HIGH (ref 70–99)
Glucose-Capillary: 134 mg/dL — ABNORMAL HIGH (ref 70–99)

## 2020-11-17 LAB — CBC
HCT: 38.6 % (ref 36.0–46.0)
Hemoglobin: 11.6 g/dL — ABNORMAL LOW (ref 12.0–15.0)
MCH: 26.7 pg (ref 26.0–34.0)
MCHC: 30.1 g/dL (ref 30.0–36.0)
MCV: 88.9 fL (ref 80.0–100.0)
Platelets: 222 10*3/uL (ref 150–400)
RBC: 4.34 MIL/uL (ref 3.87–5.11)
RDW: 14.2 % (ref 11.5–15.5)
WBC: 9.2 10*3/uL (ref 4.0–10.5)
nRBC: 0 % (ref 0.0–0.2)

## 2020-11-17 MED ORDER — LATANOPROST 0.005 % OP SOLN
1.0000 [drp] | Freq: Every day | OPHTHALMIC | Status: DC
  Administered 2020-11-18 – 2020-11-20 (×4): 1 [drp] via OPHTHALMIC
  Filled 2020-11-17: qty 2.5

## 2020-11-17 MED ORDER — INSULIN ASPART 100 UNIT/ML ~~LOC~~ SOLN
0.0000 [IU] | Freq: Every day | SUBCUTANEOUS | Status: DC
  Administered 2020-11-18: 3 [IU] via SUBCUTANEOUS
  Administered 2020-11-18: 4 [IU] via SUBCUTANEOUS
  Administered 2020-11-19: 2 [IU] via SUBCUTANEOUS
  Administered 2020-11-20: 5 [IU] via SUBCUTANEOUS

## 2020-11-17 MED ORDER — VITAMIN B-12 1000 MCG PO TABS
1000.0000 ug | ORAL_TABLET | Freq: Every day | ORAL | Status: DC
  Administered 2020-11-18 – 2020-11-21 (×4): 1000 ug via ORAL
  Filled 2020-11-17 (×4): qty 1

## 2020-11-17 MED ORDER — MONTELUKAST SODIUM 10 MG PO TABS
10.0000 mg | ORAL_TABLET | Freq: Every day | ORAL | Status: DC
  Administered 2020-11-18 – 2020-11-20 (×4): 10 mg via ORAL
  Filled 2020-11-17 (×5): qty 1

## 2020-11-17 MED ORDER — ONDANSETRON HCL 4 MG/2ML IJ SOLN: 4.0000 mg | Freq: Four times a day (QID) | INTRAMUSCULAR | Status: DC | PRN

## 2020-11-17 MED ORDER — ROSUVASTATIN CALCIUM 20 MG PO TABS
40.0000 mg | ORAL_TABLET | Freq: Every day | ORAL | Status: DC
  Administered 2020-11-18 – 2020-11-20 (×4): 40 mg via ORAL
  Filled 2020-11-17 (×5): qty 2

## 2020-11-17 MED ORDER — ASPIRIN EC 81 MG PO TBEC
81.0000 mg | DELAYED_RELEASE_TABLET | Freq: Every day | ORAL | Status: DC
  Administered 2020-11-18 – 2020-11-19 (×2): 81 mg via ORAL
  Filled 2020-11-17 (×2): qty 1

## 2020-11-17 MED ORDER — GABAPENTIN 100 MG PO CAPS
100.0000 mg | ORAL_CAPSULE | Freq: Two times a day (BID) | ORAL | Status: DC
  Administered 2020-11-18 – 2020-11-21 (×8): 100 mg via ORAL
  Filled 2020-11-17 (×8): qty 1

## 2020-11-17 MED ORDER — INSULIN ASPART 100 UNIT/ML ~~LOC~~ SOLN
0.0000 [IU] | Freq: Three times a day (TID) | SUBCUTANEOUS | Status: DC
  Administered 2020-11-18 (×2): 1 [IU] via SUBCUTANEOUS
  Administered 2020-11-18: 3 [IU] via SUBCUTANEOUS

## 2020-11-17 MED ORDER — DORZOLAMIDE HCL-TIMOLOL MAL 2-0.5 % OP SOLN
1.0000 [drp] | Freq: Two times a day (BID) | OPHTHALMIC | Status: DC
  Administered 2020-11-18 – 2020-11-21 (×6): 1 [drp] via OPHTHALMIC
  Filled 2020-11-17: qty 10

## 2020-11-17 MED ORDER — PANTOPRAZOLE SODIUM 40 MG PO TBEC
40.0000 mg | DELAYED_RELEASE_TABLET | Freq: Every day | ORAL | Status: DC
  Administered 2020-11-18 – 2020-11-21 (×4): 40 mg via ORAL
  Filled 2020-11-17 (×4): qty 1

## 2020-11-17 MED ORDER — VITAMIN D 25 MCG (1000 UNIT) PO TABS
1000.0000 [IU] | ORAL_TABLET | Freq: Every day | ORAL | Status: DC
  Administered 2020-11-18 – 2020-11-21 (×4): 1000 [IU] via ORAL
  Filled 2020-11-17 (×4): qty 1

## 2020-11-17 MED ORDER — APIXABAN 5 MG PO TABS
5.0000 mg | ORAL_TABLET | Freq: Two times a day (BID) | ORAL | Status: DC
  Administered 2020-11-18: 5 mg via ORAL
  Filled 2020-11-17 (×2): qty 1

## 2020-11-17 MED ORDER — ADULT MULTIVITAMIN W/MINERALS CH
1.0000 | ORAL_TABLET | Freq: Every day | ORAL | Status: DC
  Administered 2020-11-18 – 2020-11-21 (×4): 1 via ORAL
  Filled 2020-11-17 (×4): qty 1

## 2020-11-17 MED ORDER — CALCIUM CARBONATE-VITAMIN D 500-200 MG-UNIT PO TABS
1.0000 | ORAL_TABLET | Freq: Two times a day (BID) | ORAL | Status: DC
  Administered 2020-11-18 – 2020-11-21 (×6): 1 via ORAL
  Filled 2020-11-17 (×8): qty 1

## 2020-11-17 MED ORDER — HYDRALAZINE HCL 20 MG/ML IJ SOLN: 5.0000 mg | Freq: Four times a day (QID) | INTRAMUSCULAR | Status: DC | PRN

## 2020-11-17 MED ORDER — HEPARIN SODIUM (PORCINE) 5000 UNIT/ML IJ SOLN: 5000.0000 [IU] | Freq: Three times a day (TID) | INTRAMUSCULAR | Status: DC

## 2020-11-17 MED ORDER — BRIMONIDINE TARTRATE 0.2 % OP SOLN
1.0000 [drp] | Freq: Three times a day (TID) | OPHTHALMIC | Status: DC
  Administered 2020-11-18 – 2020-11-21 (×10): 1 [drp] via OPHTHALMIC
  Filled 2020-11-17: qty 5

## 2020-11-17 MED ORDER — SODIUM CHLORIDE 0.9 % IV SOLN: INTRAVENOUS | Status: DC

## 2020-11-17 MED ORDER — HYDRALAZINE HCL 10 MG PO TABS
10.0000 mg | ORAL_TABLET | Freq: Three times a day (TID) | ORAL | Status: DC
  Administered 2020-11-18 – 2020-11-19 (×6): 10 mg via ORAL
  Filled 2020-11-17 (×7): qty 1

## 2020-11-17 NOTE — ED Provider Notes (Signed)
Care assumed from Dr. Eulis Foster at shift change pending cardiology consult.  See his note for full H&P, exam and MDM.  Briefly this is an 87 year old female presenting with dizziness, nausea and emesis.  Patient found to have transient heart block and a rate in the 30s.  Cardiology saw patient in consult and are recommending medical admission given her multiple comorbidities including CKD, type 2 diabetes, hypertension, hyperlipidemia.  Cardiology will see patient first thing tomorrow morning.  They asked that she not be given any nodal blocking agents.  Unassigned admission.  Spoke with Dr. Nevada Crane with hospitalist service who agrees to assume care of patient and bring into the hospital for further evaluation and management.     Portions of this note were generated with Lobby[employer]. Dictation errors may occur despite best attempts at proofreading.       Barrie Folk, PA-C 11/17/20 2203    Daleen Bo, MD 11/18/20 1843

## 2020-11-17 NOTE — ED Provider Notes (Signed)
[facility] EMERGENCY DEPARTMENT Provider Note   CSN: [id number] Arrival date & time: 11/17/20  1701     History Chief Complaint  Patient presents with  . Vomiting  . Weakness    Kristen Fox is a 87 y.o. female.  HPI She presents for evaluation of dizziness, followed by nausea and vomiting.  This occurred earlier today while she was at home in [place], [place].  Family members called EMS who came to her home and did a assessment.  They found her heart rate low, between 35 and 40, blood pressure 121 over 46, oxygen saturation normal on room air 98%.  They did twelve-lead cardiac monitoring, patient presents with strips done at 1523 this afternoon, showing A-V dissociation, bradycardic at a rate of 34.  See representative photo, below. Patient denies recent illnesses. She reports having multiple episodes similar to this over the last 6 months. She is taking her usual medicines. States she has had atrial fibrillation in the past but that has been resolved and not requiring ongoing management. She believes that he is on metoprolol since that diagnosis. There are no other known modifying factors.    Past Medical History:  Diagnosis Date  . Asthma due to environmental allergies   . Basal cell carcinoma 05/08/2020   bcc ant. mid neck   . Breast cancer ([place])    2003  . Cataract   . Chronic kidney disease   . Clotting disorder ([place])   . Diabetes mellitus ([place])    type II  . Diabetic coma ([place])   . GERD (gastroesophageal reflux disease)   . Glaucoma   . Hyperlipidemia   . Hypertension   . Osteoporosis   . Pneumonia   . Pulmonary embolism ([place])   . Squamous cell carcinoma of skin 10/18/2014   well diff-right upper arm  . Squamous cell carcinoma of skin 06/02/2019   in situ-upper lip (txpbx)  . Tubular adenoma of colon 2017    There are no problems to display for this patient.   Past Surgical History:  Procedure Laterality Date  . APPENDECTOMY    .  CATARACT EXTRACTION, BILATERAL    . MASTECTOMY     left w/ymph node removal  . SKIN CANCER EXCISION    . TONSILLECTOMY    . VAGINAL HYSTERECTOMY  1981     OB History   No obstetric history on file.     Family History  Problem Relation Age of Onset  . Diabetes Mother   . CVA Mother   . Leukemia Father   . Colon cancer Neg Hx   . Stomach cancer Neg Hx   . Pancreatic cancer Neg Hx   . Esophageal cancer Neg Hx     Social History   Tobacco Use  . Smoking status: Never Smoker  . Smokeless tobacco: Never Used  Vaping Use  . Vaping Use: Never used  Substance Use Topics  . Alcohol use: No  . Drug use: No    Home Medications Prior to Admission medications   Medication Sig Start Date End Date Taking? Authorizing Provider  amLODipine (NORVASC) 5 MG tablet Take 5 mg by mouth daily.   Yes [provider]  apixaban (ELIQUIS) 5 MG TABS tablet Take 5 mg by mouth 2 (two) times daily.   Yes [provider]  aspirin EC 81 MG tablet Take 81 mg by mouth daily.   Yes [provider]  brimonidine (ALPHAGAN) 0.2 % ophthalmic solution Place 1 drop into  both eyes 3 (three) times daily.   Yes [provider]  Calcium Carb-Cholecalciferol (CALCIUM 600-D PO) Take 1 tablet by mouth 2 (two) times daily.   Yes [provider]  Cholecalciferol (VITAMIN D3 PO) Take 1 tablet by mouth 2 (two) times daily.   Yes [provider]  denosumab (PROLIA) 60 MG/ML SOLN injection Inject 60 mg into the skin every 6 (six) months. Administer in upper arm, thigh, or abdomen   Yes [provider]  dorzolamide-timolol (COSOPT) 22.3-6.8 MG/ML ophthalmic solution Place 1 drop into both eyes 2 (two) times daily.   Yes [provider]  esomeprazole (NEXIUM) 20 MG capsule Take 20 mg by mouth daily at 12 noon.   Yes [provider]  gabapentin (NEURONTIN) 300 MG capsule Take 300 mg by mouth 2 (two) times daily.   Yes [provider]   hydrochlorothiazide (HYDRODIURIL) 25 MG tablet Take 25 mg by mouth daily. 09/27/20  Yes [provider]  insulin degludec (TRESIBA FLEXTOUCH) 100 UNIT/ML FlexTouch Pen Inject 10-25 Units into the skin at bedtime. Based on CBG   Yes [provider]  insulin lispro (HUMALOG) 100 UNIT/ML KwikPen Inject 10 Units into the skin daily with lunch.   Yes [provider]  latanoprost (XALATAN) 0.005 % ophthalmic solution Place 1 drop into both eyes at bedtime. 08/23/20  Yes [provider]  lisinopril (ZESTRIL) 40 MG tablet Take 40 mg by mouth daily. 09/27/20  Yes [provider]  metFORMIN (GLUCOPHAGE-XR) 500 MG 24 hr tablet Take 1,000 mg by mouth 2 (two) times daily. 06/26/20  Yes [provider]  metoprolol tartrate (LOPRESSOR) 100 MG tablet Take 100 mg by mouth 2 (two) times daily. 09/27/20  Yes [provider]  montelukast (SINGULAIR) 10 MG tablet Take 10 mg by mouth at bedtime.   Yes [provider]  rosuvastatin (CRESTOR) 40 MG tablet Take 40 mg by mouth at bedtime.   Yes [provider]  vitamin B-12 (CYANOCOBALAMIN) 500 MCG tablet Take 1,000 mcg by mouth daily.   Yes [provider]    Allergies    Metformin  Review of Systems   Review of Systems  All other systems reviewed and are negative.   Physical Exam Updated Vital Signs BP (!) 163/82   Pulse (!) 110   Temp 97.9 F (36.6 C) (Oral)   Resp 14   Ht 5\' 5"  (1.651 m)   Wt 74.4 kg   SpO2 98%   BMI 27.29 kg/m   Physical Exam Vitals and nursing note reviewed.  Constitutional:      Appearance: She is well-developed and well-nourished.  HENT:     Head: Normocephalic and atraumatic.     Right Ear: External ear normal.     Left Ear: External ear normal.  Eyes:     Extraocular Movements: EOM normal.     Conjunctiva/sclera: Conjunctivae normal.     Pupils: Pupils are equal, round, and reactive to light.  Neck:     Trachea: Phonation normal.   Cardiovascular:     Rate and Rhythm: Normal rate and regular rhythm.     Heart sounds: Normal heart sounds.  Pulmonary:     Effort: Pulmonary effort is normal.     Breath sounds: Normal breath sounds.  Chest:     Chest wall: No bony tenderness.  Abdominal:     Palpations: Abdomen is soft.     Tenderness: There is no abdominal tenderness.  Musculoskeletal:  General: Normal range of motion.     Cervical back: Normal range of motion and neck supple.  Skin:    General: Skin is warm, dry and intact.  Neurological:     Mental Status: She is alert and oriented to person, place, and time.     Cranial Nerves: No cranial nerve deficit.     Sensory: No sensory deficit.     Motor: No abnormal muscle tone.     Coordination: Coordination normal.  Psychiatric:        Mood and Affect: Mood and affect normal.        Behavior: Behavior normal.        Thought Content: Thought content normal.        Judgment: Judgment normal.            ED Results / Procedures / Treatments   Labs (all labs ordered are listed, but only abnormal results are displayed) Labs Reviewed  BASIC METABOLIC PANEL - Abnormal; Notable for the following components:      Result Value   Glucose, Bld 192 (*)    BUN 30 (*)    Creatinine, Ser 2.04 (*)    GFR, Estimated 24 (*)    All other components within normal limits  CBC - Abnormal; Notable for the following components:   Hemoglobin 11.6 (*)    All other components within normal limits  CBG MONITORING, ED - Abnormal; Notable for the following components:   Glucose-Capillary 183 (*)    All other components within normal limits  CBG MONITORING, ED - Abnormal; Notable for the following components:   Glucose-Capillary 134 (*)    All other components within normal limits  URINALYSIS, ROUTINE W REFLEX MICROSCOPIC      EKG EKG Interpretation  Date/Time:  Saturday November 17 2020 17:36:26 EST Ventricular Rate:  93 PR Interval:    QRS Duration: 82 QT  Interval:  368 QTC Calculation: 457 R Axis:   49 Text Interpretation: Unusual P axis and short PR, probable junctional tachycardia Cannot rule out Anterior infarct , age undetermined Abnormal ECG No old tracing to compare Confirmed by Daleen Bo [phone]) on 11/17/2020 7:28:47 PM   Radiology No results found.  Procedures Procedures   Medications Ordered in ED Medications - No data to display  ED Course  I have reviewed the triage vital signs and the nursing notes.  Pertinent labs & imaging results that were available during my care of the patient were reviewed by me and considered in my medical decision making (see chart for details).  Clinical Course as of 11/17/20 2044  Sat Nov 17, 2020  2025 Case discussed with cardiology, will come to the ED to evaluate the patient.  He anticipates hospitalizing the patient to watch her overnight. [EW]    Clinical Course User Index [EW] Daleen Bo, MD   MDM Rules/Calculators/A&P                           Patient Vitals for the past 24 hrs:  BP Temp Temp src Pulse Resp SpO2 Height Weight  11/17/20 2015 (!) 163/82 -- -- (!) 110 14 98 % -- --  11/17/20 1945 (!) 163/92 -- -- (!) 114 15 98 % -- --  11/17/20 1921 -- -- -- -- -- -- 5\' 5"  (1.651 m) 74.4 kg  11/17/20 1920 -- 97.9 F (36.6 C) Oral -- -- -- -- --  11/17/20 1920 137/72 -- -- (!) 111 18  99 % -- --  11/17/20 1728 (!) 152/139 97.9 F (36.6 C) Oral 92 18 100 % -- --    8:26 PM Reevaluation with update and discussion. After initial assessment and treatment, an updated evaluation reveals no change in status, she remains mildly tachycardic.  Findings discussed with patient, and daughter, all questions answered. Daleen Bo   Medical Decision Making:  This patient is presenting for evaluation of a period of bradycardia, which does require a range of treatment options, and is a complaint that involves a high risk of morbidity and mortality. The differential diagnoses include  medication induced bradycardia, heart block, acute illness, metabolic disorder. I decided to review old records, and in summary elderly female with multiple episodes, likely associated with bradycardia leading to dyspnea.  I obtained additional historical information from daughter at bedside.  Clinical Laboratory Tests Ordered, included CBC and Metabolic panel. Review indicates glucose high, BUN high, creatinine high. No comparison labs available.   Cardiac Monitor Tracing which shows junctional tachycardia   Critical Interventions-clinical evaluation, laboratory testing, cardiac monitor, EKG, observation reassess. Cardiology consultation, will come to the ED to see the patient  After These Interventions, the Patient was reevaluated and was found to have had transient heart block, rate in the 30s and symptomatic. Transient symptoms. Symptoms apparently recurrent. Patient on beta-blocker. She is at risk for further episodes. She will likely need cardiac pacemaker. Consider sick sinus syndrome. No metabolic abnormality or acute illnesses to cause this. Doubt CVA, leading to dizziness. Cardiology consultation the ED for confirmation and planning.  CRITICAL CARE-no Performed by: Daleen Bo  Nursing Notes Reviewed/ Care Coordinated Applicable Imaging Reviewed Interpretation of Laboratory Data incorporated into ED treatment   Plan-likely cardiology admission.    Final Clinical Impression(s) / ED Diagnoses Final diagnoses:  Complete heart block [facility])    Rx / DC Orders ED Discharge Orders    None       Daleen Bo, MD 11/17/20 2046

## 2020-11-17 NOTE — H&P (Signed)
History and Physical  Kristen Fox IHK:[id number] DOB: 13-Feb-1934 DOA: 11/17/2020  Referring physician: Georgeanna Lea, [place], [place] PCP: Josem Kaufmann, MD  Outpatient Specialists: GI Patient coming from: Home, lives in [place], [place]  Chief Complaint: Nausea vomiting dizziness  HPI: Kristen Fox is a 87 y.o. female with medical history significant for essential hypertension, type 2 diabetes, breast cancer in 2003, pulmonary embolism on Eliquis, osteoporosis, GERD, tubular adenoma of colon who presented to [facility] ED due to complaints of nausea vomiting and dizziness of a few hours duration, started around noon on the day of presentation.  Family called EMS, found her heart rate in the mid 30s, in route 12 EKG showing A-V dissociation, bradycardia with a rate of 34.  Reports possible prior history of A. fib.  Does not follow with a cardiologist.  While in the ED twelve-lead EKG showed junctional tachycardia.  Cardiology consulted by EDP who recommended hospitalist admission for further evaluation and management.  ED Course:  Afebrile, heart rate 114, BP 122/78, respiration rate 16, O2 saturation 98% on room air.  Lab studies were remarkable for creatinine 2.04, unknown baseline, serum glucose 192, hemoglobin 11.6K.  Review of Systems: Review of systems as noted in the HPI. All other systems reviewed and are negative.   Past Medical History:  Diagnosis Date  . Asthma due to environmental allergies   . Basal cell carcinoma 05/08/2020   bcc ant. mid neck   . Breast cancer ([place])    2003  . Cataract   . Chronic kidney disease   . Clotting disorder ([place])   . Diabetes mellitus ([place])    type II  . Diabetic coma ([place])   . GERD (gastroesophageal reflux disease)   . Glaucoma   . Hyperlipidemia   . Hypertension   . Osteoporosis   . Pneumonia   . Pulmonary embolism ([place])   . Squamous cell carcinoma of skin 10/18/2014   well diff-right upper arm  . Squamous cell carcinoma of skin 06/02/2019   in  situ-upper lip (txpbx)  . Tubular adenoma of colon 2017   Past Surgical History:  Procedure Laterality Date  . APPENDECTOMY    . CATARACT EXTRACTION, BILATERAL    . MASTECTOMY     left w/ymph node removal  . SKIN CANCER EXCISION    . TONSILLECTOMY    . VAGINAL HYSTERECTOMY  1981    Social History:  reports that she has never smoked. She has never used smokeless tobacco. She reports that she does not drink alcohol and does not use drugs.   Allergies  Allergen Reactions  . Metformin Other (See Comments)    Caused stomach upset (tolerates timed release)    Family History  Problem Relation Age of Onset  . Diabetes Mother   . CVA Mother   . Leukemia Father   . Colon cancer Neg Hx   . Stomach cancer Neg Hx   . Pancreatic cancer Neg Hx   . Esophageal cancer Neg Hx       Prior to Admission medications   Medication Sig Start Date End Date Taking? Authorizing Provider  amLODipine (NORVASC) 5 MG tablet Take 5 mg by mouth daily.   Yes [provider]  apixaban (ELIQUIS) 5 MG TABS tablet Take 5 mg by mouth 2 (two) times daily.   Yes [provider]  aspirin EC 81 MG tablet Take 81 mg by mouth daily.   Yes [provider]  brimonidine (ALPHAGAN) 0.2 % ophthalmic solution Place 1 drop into  both eyes 3 (three) times daily.   Yes [provider]  Calcium Carb-Cholecalciferol (CALCIUM 600-D PO) Take 1 tablet by mouth 2 (two) times daily.   Yes [provider]  Cholecalciferol (VITAMIN D3 PO) Take 1 tablet by mouth 2 (two) times daily.   Yes [provider]  denosumab (PROLIA) 60 MG/ML SOLN injection Inject 60 mg into the skin every 6 (six) months. Administer in upper arm, thigh, or abdomen   Yes [provider]  dorzolamide-timolol (COSOPT) 22.3-6.8 MG/ML ophthalmic solution Place 1 drop into both eyes 2 (two) times daily.   Yes [provider]  esomeprazole (NEXIUM) 20 MG capsule Take 20 mg by mouth daily at 12 noon.    Yes [provider]  gabapentin (NEURONTIN) 300 MG capsule Take 300 mg by mouth 2 (two) times daily.   Yes [provider]  hydrochlorothiazide (HYDRODIURIL) 25 MG tablet Take 25 mg by mouth daily. 09/27/20  Yes [provider]  insulin degludec (TRESIBA FLEXTOUCH) 100 UNIT/ML FlexTouch Pen Inject 10-25 Units into the skin at bedtime. Based on CBG   Yes [provider]  insulin lispro (HUMALOG) 100 UNIT/ML KwikPen Inject 10 Units into the skin daily with lunch.   Yes [provider]  latanoprost (XALATAN) 0.005 % ophthalmic solution Place 1 drop into both eyes at bedtime. 08/23/20  Yes [provider]  lisinopril (ZESTRIL) 40 MG tablet Take 40 mg by mouth daily. 09/27/20  Yes [provider]  metFORMIN (GLUCOPHAGE-XR) 500 MG 24 hr tablet Take 1,000 mg by mouth 2 (two) times daily. 06/26/20  Yes [provider]  metoprolol tartrate (LOPRESSOR) 100 MG tablet Take 100 mg by mouth 2 (two) times daily. 09/27/20  Yes [provider]  montelukast (SINGULAIR) 10 MG tablet Take 10 mg by mouth at bedtime.   Yes [provider]  rosuvastatin (CRESTOR) 40 MG tablet Take 40 mg by mouth at bedtime.   Yes [provider]  vitamin B-12 (CYANOCOBALAMIN) 500 MCG tablet Take 1,000 mcg by mouth daily.   Yes [provider]    Physical Exam: BP (!) 161/73   Pulse (!) 108   Temp 97.9 F (36.6 C) (Oral)   Resp 19   Ht 5\' 5"  (1.651 m)   Wt 74.4 kg   SpO2 99%   BMI 27.29 kg/m   . General: 87 y.o. year-old female well developed well nourished in no acute distress.  Alert and oriented x3. . Cardiovascular: Tachycardic with no rubs or gallops.  No thyromegaly or JVD noted.  No lower extremity edema. 2/4 pulses in all 4 extremities. Marland Kitchen Respiratory: Clear to auscultation with no wheezes or rales. Good inspiratory effort. . Abdomen: Soft nontender nondistended with normal bowel sounds x4 quadrants. . Muskuloskeletal:  No cyanosis, clubbing or edema noted bilaterally . Neuro: CN II-XII intact, strength, sensation, reflexes . Skin: No ulcerative lesions noted or rashes . Psychiatry: Judgement and insight appear normal. Mood is appropriate for condition and setting          Labs on Admission:  Basic Metabolic Panel: Recent Labs  Lab 11/17/20 1827  NA 138  K 5.0  CL 104  CO2 22  GLUCOSE 192*  BUN 30*  CREATININE 2.04*  CALCIUM 10.0   Liver Function Tests: No results for input(s): AST, ALT, ALKPHOS, BILITOT, PROT, ALBUMIN in the last 168 hours. No results for input(s): LIPASE, AMYLASE in the last 168 hours. No results for input(s): AMMONIA in the last 168 hours. CBC:  Recent Labs  Lab 11/17/20 1827  WBC 9.2  HGB 11.6*  HCT 38.6  MCV 88.9  PLT 222   Cardiac Enzymes: No results for input(s): CKTOTAL, CKMB, CKMBINDEX, TROPONINI in the last 168 hours.  BNP (last 3 results) No results for input(s): BNP in the last 8760 hours.  ProBNP (last 3 results) No results for input(s): PROBNP in the last 8760 hours.  CBG: Recent Labs  Lab 11/17/20 1746 11/17/20 1928  GLUCAP 183* 134*    Radiological Exams on Admission: No results found.  EKG: I independently viewed the EKG done and my findings are as followed: junctional tachycardia rate 111, nonspecific ST-T changes..   Assessment/Plan Present on Admission: . Sick sinus syndrome (HCC)  Active Problems:   Sick sinus syndrome (HCC)  Possible symptomatic sick sinus syndrome Presented with nausea vomiting and dizziness of a few hours duration, started around noon on the day of presentation.   States she has had similar episodes in the past in the last 6 months. Family called EMS, found her heart rate in the mid 30s, in route 12 EKG showing A-V dissociation, bradycardia with a rate of 34.   Reports possible prior history of A. fib.  Does not follow with a cardiologist.  While in the ED twelve-lead EKG showed junctional tachycardia, rate  of 111.  Cardiology consulted by EDP  Avoid AV nodal blockade agents Closely monitor on telemetry cardiac Obtain 2D echo May require pacemaker placement during this hospitalization.  N.p.o. after midnight, restart diet if no planned procedure by cardiology. Rest of management per cardiology  Intractable nausea vomiting and dizziness Suspected symptomatic from presumed sick sinus syndrome Obtain UA to rule out other etiologies Obtain orthostatic vital signs Start gentle IV fluid hydration Fall precautions PT to assess when hemodynamically stable IV antiemetics as needed  Elevated creatinine, CKD 4 or AKI? no records to compare No baseline to compare Patient lives in [place] with creatinine of 2.04 with GFR 24 Avoid nephrotoxins and dehydration Continue gentle IV fluid hydration normal saline at 50 cc/h x 1 day. Hold off home lisinopril and HCTZ for now due to elevated creatinine. Monitor urine output Repeat renal panel in the morning.  Essential hypertension BP is currently not at goal Avoid AV nodal blocking agents IV hydralazine as needed with parameters Start p.o. hydralazine 10 mg 3 times daily. Closely monitor vital signs  Type 2 diabetes with hyperglycemia Serum glucose on presentation 192 Obtain hemoglobin A1c Start insulin sliding scale.  Peripheral neuropathy Resume gabapentin  History of PE on Eliquis Resume home Eliquis  Osteoporosis On Prolia    DVT prophylaxis: Eliquis  Code Status: Full code for now as stated by the patient in anticipation for pacemaker placement.  She states she has an advanced directive and may change her CODE STATUS after completion of any procedures.    Family Communication: None at bedside.  Disposition Plan: Admit to telemetry cardiac  Consults called: Cardiology consulted by EDP.  Please contact cardiology in the morning.  Discussed the case with Dr. Ailene Ravel on 11/17/2020.  Admission status: Inpatient  status.  Patient will require at least 2 midnights for further evaluation and treatment of present condition.   Status is: Inpatient    Dispo:  Patient From: Home  Planned Disposition: Home  Expected discharge date: 11/19/2020  Medically stable for discharge: No, ongoing management of suspected sick sinus syndrome.         Kayleen Memos MD [facility] Pager [phone]  If 7PM-7AM, please contact night-coverage [email or web address] Password [facility]  11/17/2020, 10:24 PM

## 2020-11-17 NOTE — Consult Note (Addendum)
Cardiology Consultation:   Patient ID: Kristen Fox MRN: [id number]; DOB: [birth year]  Admit date: 11/17/2020 Date of Consult: 11/17/2020  Primary Care Provider: Josem Kaufmann, MD [facility] Cardiologist: No primary care provider on file. [facility] Electrophysiologist:  None   Patient Profile:   Kristen Fox is an 36F with pAF, DM2, HLD, HTN, prior PE, GERD, glaucoma, and CKD who presents with dizziness and presyncope.   History of Present Illness:   Kristen Fox reports around 1230 today she was in her kitchen standing and preparing lunch when she had acute onset "swimmy headed feeling" for which she went to the living room to sit down.  Her daughter called to check at around the same time and while on the phone she started to become nauseous and throw up.  Her daughter immediately came over to check on her and found her sitting down extremely nauseous.  She tried to help her up to the restroom as Kristen Fox then developed diarrhea.  She tried to help her up and Kristen Fox felt extremely dizzy and her daughter could not support her weight so she slowly lowered her to the ground.  At this point her daughter checked her blood sugar thinking that this was symptomatic hypoglycemia however her sugar was only in the mid 70s.  Her son-in-law then checked her pulse and said that it was in the 30s at which point they called 911.  On EMS arrival they controlled that her heart rate was in the 30s at which point she was transported to the ED.  On further discussion with the patient and her daughter she has had multiple episodes of presyncope and recurrent falls over the past 6-12 months.  Her most recent fall was into a door frame around 1.5 weeks ago where she hit her left eye resulting in a bruise.  Record review and discussion with Kristen Fox and her daughter she was diagnosed with AF following RSV pneumonia in 2020 and has been on Eliquis metoprolol since event.  She is followed at [facility] clinic and the first record documents Eliquis was on 02/2019.  She denied any preceding chest pain, pressure, or generalized discomfort prior to this episode today.  Past Medical History:  Diagnosis Date  . Asthma due to environmental allergies   . Basal cell carcinoma 05/08/2020   bcc ant. mid neck   . Breast cancer ([place])    2003  . Cataract   . Chronic kidney disease   . Clotting disorder ([place])   . Diabetes mellitus ([place])    type II  . Diabetic coma ([place])   . GERD (gastroesophageal reflux disease)   . Glaucoma   . Hyperlipidemia   . Hypertension   . Osteoporosis   . Pneumonia   . Pulmonary embolism ([place])   . Squamous cell carcinoma of skin 10/18/2014   well diff-right upper arm  . Squamous cell carcinoma of skin 06/02/2019   in situ-upper lip (txpbx)  . Tubular adenoma of colon 2017   Past Surgical History:  Procedure Laterality Date  . APPENDECTOMY    . CATARACT EXTRACTION, BILATERAL    . MASTECTOMY     left w/ymph node removal  . SKIN CANCER EXCISION    . TONSILLECTOMY    . VAGINAL HYSTERECTOMY  1981     Home Medications:  Prior to Admission medications   Medication Sig Start Date End Date Taking? Authorizing Provider  amLODipine (NORVASC) 5 MG tablet Take 5 mg by mouth  daily.   Yes [provider]  apixaban (ELIQUIS) 5 MG TABS tablet Take 5 mg by mouth 2 (two) times daily.   Yes [provider]  aspirin EC 81 MG tablet Take 81 mg by mouth daily.   Yes [provider]  brimonidine (ALPHAGAN) 0.2 % ophthalmic solution Place 1 drop into both eyes 3 (three) times daily.   Yes [provider]  Calcium Carb-Cholecalciferol (CALCIUM 600-D PO) Take 1 tablet by mouth 2 (two) times daily.   Yes [provider]  Cholecalciferol (VITAMIN D3 PO) Take 1 tablet by mouth 2 (two) times daily.   Yes [provider]  denosumab (PROLIA) 60 MG/ML SOLN injection Inject 60 mg into the skin every 6 (six) months.  Administer in upper arm, thigh, or abdomen   Yes [provider]  dorzolamide-timolol (COSOPT) 22.3-6.8 MG/ML ophthalmic solution Place 1 drop into both eyes 2 (two) times daily.   Yes [provider]  esomeprazole (NEXIUM) 20 MG capsule Take 20 mg by mouth daily at 12 noon.   Yes [provider]  gabapentin (NEURONTIN) 300 MG capsule Take 300 mg by mouth 2 (two) times daily.   Yes [provider]  hydrochlorothiazide (HYDRODIURIL) 25 MG tablet Take 25 mg by mouth daily. 09/27/20  Yes [provider]  insulin degludec (TRESIBA FLEXTOUCH) 100 UNIT/ML FlexTouch Pen Inject 10-25 Units into the skin at bedtime. Based on CBG   Yes [provider]  insulin lispro (HUMALOG) 100 UNIT/ML KwikPen Inject 10 Units into the skin daily with lunch.   Yes [provider]  latanoprost (XALATAN) 0.005 % ophthalmic solution Place 1 drop into both eyes at bedtime. 08/23/20  Yes [provider]  lisinopril (ZESTRIL) 40 MG tablet Take 40 mg by mouth daily. 09/27/20  Yes [provider]  metFORMIN (GLUCOPHAGE-XR) 500 MG 24 hr tablet Take 1,000 mg by mouth 2 (two) times daily. 06/26/20  Yes [provider]  metoprolol tartrate (LOPRESSOR) 100 MG tablet Take 100 mg by mouth 2 (two) times daily. 09/27/20  Yes [provider]  montelukast (SINGULAIR) 10 MG tablet Take 10 mg by mouth at bedtime.   Yes [provider]  rosuvastatin (CRESTOR) 40 MG tablet Take 40 mg by mouth at bedtime.   Yes [provider]  vitamin B-12 (CYANOCOBALAMIN) 500 MCG tablet Take 1,000 mcg by mouth daily.   Yes [provider]    Inpatient Medications: Scheduled Meds: . [START ON 11/18/2020] aspirin EC  81 mg Oral Daily  . heparin  5,000 Units Subcutaneous Q8H  . insulin aspart  0-5 Units Subcutaneous QHS  . [START ON 11/18/2020] insulin aspart  0-6 Units Subcutaneous TID WC  . montelukast  10 mg Oral QHS  . [START ON 11/18/2020]  multivitamin with minerals  1 tablet Oral Daily  . [START ON 11/18/2020] pantoprazole  40 mg Oral Daily  . rosuvastatin  40 mg Oral QHS  . [START ON 11/18/2020] vitamin B-12  1,000 mcg Oral Daily   Continuous Infusions: . sodium chloride    . sodium chloride     PRN Meds: hydrALAZINE, ondansetron (ZOFRAN) IV  Allergies:    Allergies  Allergen Reactions  . Metformin Other (See Comments)    Caused stomach upset (tolerates timed release)    Social History:   Social History   Socioeconomic History  . Marital status: Single    Spouse name: Not on file  . Number of children: Not on file  . Years of  education: Not on file  . Highest education level: Not on file  Occupational History  . Not on file  Tobacco Use  . Smoking status: Never Smoker  . Smokeless tobacco: Never Used  Vaping Use  . Vaping Use: Never used  Substance and Sexual Activity  . Alcohol use: No  . Drug use: No  . Sexual activity: Not on file  Other Topics Concern  . Not on file  Social History Narrative  . Not on file   Social Determinants of Health   Financial Resource Strain: Not on file  Food Insecurity: Not on file  Transportation Needs: Not on file  Physical Activity: Not on file  Stress: Not on file  Social Connections: Not on file  Intimate Partner Violence: Not on file    Family History:    Family History  Problem Relation Age of Onset  . Diabetes Mother   . CVA Mother   . Leukemia Father   . Colon cancer Neg Hx   . Stomach cancer Neg Hx   . Pancreatic cancer Neg Hx   . Esophageal cancer Neg Hx     ROS:  Review of Systems: [y] = yes, [ ]  = no       General: Weight gain [ ] ; Weight loss [ ] ; Anorexia [ ] ; Fatigue [ ] ; Fever [ ] ; Chills [ ] ; Weakness [ ]     Cardiac: Chest pain/pressure [ ] ; Resting SOB [ ] ; Exertional SOB [ ] ; Orthopnea [ ] ; Pedal Edema [ ] ; Palpitations [ ] ; Syncope [ ] ; Presyncope [y]; Paroxysmal nocturnal dyspnea [ ]     Pulmonary: Cough [ ] ; Wheezing [ ] ;  Hemoptysis [ ] ; Sputum [ ] ; Snoring [ ]     GI: Vomiting [ ] ; Dysphagia [ ] ; Melena [ ] ; Hematochezia [ ] ; Heartburn [ ] ; Abdominal pain [ ] ; Constipation [ ] ; Diarrhea [ ] ; BRBPR [ ]     GU: Hematuria [ ] ; Dysuria [ ] ; Nocturia [ ]   Vascular: Pain in legs with walking [ ] ; Pain in feet with lying flat [ ] ; Non-healing sores [ ] ; Stroke [ ] ; TIA [ ] ; Slurred speech [ ] ;    Neuro: Headaches [ ] ; Vertigo [ ] ; Seizures [ ] ; Paresthesias [ ] ;Blurred vision [ ] ; Diplopia [ ] ; Vision changes [ ]     Ortho/Skin: Arthritis [ ] ; Joint pain [ ] ; Muscle pain [ ] ; Joint swelling [ ] ; Back Pain [ ] ; Rash [ ]     Psych: Depression [ ] ; Anxiety [ ]     Heme: Bleeding problems [ ] ; Clotting disorders [ ] ; Anemia [ ]     Endocrine: Diabetes [ ] ; Thyroid dysfunction [ ]    Physical Exam/Data:   Vitals:   11/17/20 1921 11/17/20 1945 11/17/20 2015 11/17/20 2156  BP:  (!) 163/92 (!) 163/82 (!) 161/73  Pulse:  (!) 114 (!) 110 (!) 108  Resp:  15 14 19   Temp:      TempSrc:      SpO2:  98% 98% 99%  Weight: 74.4 kg     Height: 5\' 5"  (1.651 m)      No intake or output data in the 24 hours ending 11/17/20 2249 Last 3 Weights 11/17/2020 08/18/2019 08/11/2019  Weight (lbs) 164 lb 167 lb 167 lb  Weight (kg) 74.39 kg 75.751 kg 75.751 kg     Body mass index is 27.29 kg/m.  General:  Well nourished, well developed, in no acute distress HEENT: normal Lymph: no adenopathy Neck: no JVD  Endocrine:  No thryomegaly Vascular: No carotid bruits; FA pulses 2+ bilaterally without bruits  Cardiac:  normal S1, S2; regular rhythm, accelerated rate; no murmur  Lungs:  clear to auscultation bilaterally, no wheezing, rhonchi or rales  Abd: soft, nontender, no hepatomegaly  Ext: no edema Musculoskeletal:  No deformities, BUE and BLE strength normal and equal Skin: warm and dry  Neuro:  CNs 2-12 intact, no focal abnormalities noted Psych:  Normal affect   EKG:  The EKG was personally reviewed and demonstrates: junctional  tachycardia (HR 111), rhythm strips reviewed from EMS with junctional bradycardia and no obvious P waves, HR 30s Telemetry:  Telemetry was personally reviewed and demonstrates: junctional tachycardia 100-115  Relevant CV Studies: None   Laboratory Data:  High Sensitivity Troponin:  No results for input(s): TROPONINIHS in the last 720 hours.   Chemistry Recent Labs  Lab 11/17/20 1827  NA 138  K 5.0  CL 104  CO2 22  GLUCOSE 192*  BUN 30*  CREATININE 2.04*  CALCIUM 10.0  GFRNONAA 24*  ANIONGAP 12    No results for input(s): PROT, ALBUMIN, AST, ALT, ALKPHOS, BILITOT in the last 168 hours. Hematology Recent Labs  Lab 11/17/20 1827  WBC 9.2  RBC 4.34  HGB 11.6*  HCT 38.6  MCV 88.9  MCH 26.7  MCHC 30.1  RDW 14.2  PLT 222   BNPNo results for input(s): BNP, PROBNP in the last 168 hours.  DDimer No results for input(s): DDIMER in the last 168 hours.  Radiology/Studies:  No results found. { Assessment and Plan:   Kristen Fox is an 17F with pAF, DM2, HLD, HTN, prior PE, GERD, glaucoma, and CKD who presents with dizziness and presyncope.  Most likely Kristen Fox has sick sinus syndrome based off her alternating junctional tach and symptomatic bradycardia.  Her atrial fibrillation does not seem to be causing any symptoms and it is unclear what her AF burden is but she has not had any outside monitoring since admission diagnosis.  Although I think it is reasonable to hold metoprolol and monitor her heart rates over the next 24 hours, I think more likely she will end up requiring PPM prior to discharge.  She does not have a current indication for aggressive rate control with metoprolol tartrate p.o. twice daily and she does not have a diagnosis of heart failure or coronary disease requiring beta-blockade.  I discussed different management options with her family who lives conservatively for the discontinuation of metoprolol, monitoring heart rate, and discharge with a 14-day monitor  compared to consider inpatient PPM placement if any symptomatic bradycardia occurrs while off of nodal blockade.  If she does not have any recurrence of symptoms while off of metoprolol then she may be able to discharge without device placement this admission however I do worry given her recurrent falls what her risk is for recurrence of symptomatic bradycardia.  She had no concerning ischemic symptoms prior to admission.  Her junctional escape fortunately was fairly narrow.  We will plan to continue to follow her telemetry for ongoing discussion of whether PPM is indicated this admission.  Continue to hold all nodal blocking agents and continue her Eliquis, if we proceed with PPM placement can likely do on eliquis but will defer to EP tomorrow if they prefer holding 24h ahead of time if device needed on Monday. Would d/c aspirin as she does not have an indication for both AP/AC.   For questions or updates, please contact [place] Please consult  [email or web address] for contact info under   Signed, Dion Body, MD  11/17/2020 10:49 PM

## 2020-11-17 NOTE — ED Triage Notes (Signed)
Pt reports nausea, vomiting, and headache since noon today.  Sugars have been low at home.  Felt lightheaded and fell going to bathroom.  Family caught her.  States EMS came out and HR 35-37.  Denies dizziness/lightheadedness at present.  No neuro deficits noted.

## 2020-11-18 ENCOUNTER — Inpatient Hospital Stay (HOSPITAL_COMMUNITY): Payer: Medicare Other

## 2020-11-18 ENCOUNTER — Encounter (HOSPITAL_COMMUNITY): Payer: Self-pay | Admitting: Internal Medicine

## 2020-11-18 DIAGNOSIS — I4892 Unspecified atrial flutter: Secondary | ICD-10-CM

## 2020-11-18 DIAGNOSIS — I442 Atrioventricular block, complete: Secondary | ICD-10-CM | POA: Insufficient documentation

## 2020-11-18 DIAGNOSIS — I495 Sick sinus syndrome: Secondary | ICD-10-CM | POA: Diagnosis not present

## 2020-11-18 DIAGNOSIS — R7989 Other specified abnormal findings of blood chemistry: Secondary | ICD-10-CM

## 2020-11-18 LAB — URINALYSIS, ROUTINE W REFLEX MICROSCOPIC
Bilirubin Urine: NEGATIVE
Glucose, UA: 50 mg/dL — AB
Hgb urine dipstick: NEGATIVE
Ketones, ur: NEGATIVE mg/dL
Leukocytes,Ua: NEGATIVE
Nitrite: NEGATIVE
Protein, ur: 30 mg/dL — AB
Specific Gravity, Urine: 1.01 (ref 1.005–1.030)
pH: 7 (ref 5.0–8.0)

## 2020-11-18 LAB — CBC
HCT: 33.4 % — ABNORMAL LOW (ref 36.0–46.0)
HCT: 30 % — ABNORMAL LOW (ref 36.0–46.0)
Hemoglobin: 10.4 g/dL — ABNORMAL LOW (ref 12.0–15.0)
Hemoglobin: 9.8 g/dL — ABNORMAL LOW (ref 12.0–15.0)
MCH: 27.3 pg (ref 26.0–34.0)
MCH: 28.1 pg (ref 26.0–34.0)
MCHC: 31.1 g/dL (ref 30.0–36.0)
MCHC: 32.7 g/dL (ref 30.0–36.0)
MCV: 87.7 fL (ref 80.0–100.0)
MCV: 86 fL (ref 80.0–100.0)
Platelets: 196 10*3/uL (ref 150–400)
Platelets: 180 10*3/uL (ref 150–400)
RBC: 3.81 MIL/uL — ABNORMAL LOW (ref 3.87–5.11)
RBC: 3.49 MIL/uL — ABNORMAL LOW (ref 3.87–5.11)
RDW: 14.2 % (ref 11.5–15.5)
RDW: 14.2 % (ref 11.5–15.5)
WBC: 8.8 10*3/uL (ref 4.0–10.5)
WBC: 8.1 10*3/uL (ref 4.0–10.5)
nRBC: 0 % (ref 0.0–0.2)
nRBC: 0 % (ref 0.0–0.2)

## 2020-11-18 LAB — COMPREHENSIVE METABOLIC PANEL
ALT: 15 U/L (ref 0–44)
AST: 22 U/L (ref 15–41)
Albumin: 3.3 g/dL — ABNORMAL LOW (ref 3.5–5.0)
Alkaline Phosphatase: 28 U/L — ABNORMAL LOW (ref 38–126)
Anion gap: 12 (ref 5–15)
BUN: 30 mg/dL — ABNORMAL HIGH (ref 8–23)
CO2: 24 mmol/L (ref 22–32)
Calcium: 9.5 mg/dL (ref 8.9–10.3)
Chloride: 101 mmol/L (ref 98–111)
Creatinine, Ser: 1.77 mg/dL — ABNORMAL HIGH (ref 0.44–1.00)
GFR, Estimated: 28 mL/min — ABNORMAL LOW (ref >60)
Glucose, Bld: 186 mg/dL — ABNORMAL HIGH (ref 70–99)
Potassium: 3.8 mmol/L (ref 3.5–5.1)
Sodium: 137 mmol/L (ref 135–145)
Total Bilirubin: 0.4 mg/dL (ref 0.3–1.2)
Total Protein: 5.9 g/dL — ABNORMAL LOW (ref 6.5–8.1)

## 2020-11-18 LAB — TSH: TSH: 2.401 u[IU]/mL (ref 0.350–4.500)

## 2020-11-18 LAB — CREATININE, SERUM
Creatinine, Ser: 1.83 mg/dL — ABNORMAL HIGH (ref 0.44–1.00)
GFR, Estimated: 27 mL/min — ABNORMAL LOW (ref >60)

## 2020-11-18 LAB — GLUCOSE, CAPILLARY
Glucose-Capillary: 274 mg/dL — ABNORMAL HIGH (ref 70–99)
Glucose-Capillary: 343 mg/dL — ABNORMAL HIGH (ref 70–99)

## 2020-11-18 LAB — CBG MONITORING, ED
Glucose-Capillary: 157 mg/dL — ABNORMAL HIGH (ref 70–99)
Glucose-Capillary: 171 mg/dL — ABNORMAL HIGH (ref 70–99)
Glucose-Capillary: 254 mg/dL — ABNORMAL HIGH (ref 70–99)

## 2020-11-18 LAB — HEMOGLOBIN A1C
Hgb A1c MFr Bld: 7.6 % — ABNORMAL HIGH (ref 4.8–5.6)
Mean Plasma Glucose: 171.42 mg/dL

## 2020-11-18 LAB — SARS CORONAVIRUS 2 (TAT 6-24 HRS): SARS Coronavirus 2: NEGATIVE

## 2020-11-18 LAB — MAGNESIUM: Magnesium: 2.1 mg/dL (ref 1.7–2.4)

## 2020-11-18 MED ORDER — APIXABAN 2.5 MG PO TABS
2.5000 mg | ORAL_TABLET | Freq: Two times a day (BID) | ORAL | Status: DC
  Administered 2020-11-18 (×2): 2.5 mg via ORAL
  Filled 2020-11-18 (×2): qty 1

## 2020-11-18 MED ORDER — ACETAMINOPHEN 325 MG PO TABS
650.0000 mg | ORAL_TABLET | ORAL | Status: DC | PRN
  Administered 2020-11-18: 650 mg via ORAL
  Filled 2020-11-18: qty 2

## 2020-11-18 NOTE — ED Notes (Signed)
Secretary to page physician regarding increased HR.

## 2020-11-18 NOTE — Progress Notes (Signed)
Attempted echo. HR in the 140s. Contacted ordering physician on how to proceed with exam.

## 2020-11-18 NOTE — ED Notes (Signed)
Spoke with Nevada Crane MD regarding pt heart rate. MD advised there was no medication to treat rate at this time. Pt to receive pacemaker placement for condition.

## 2020-11-18 NOTE — ED Notes (Signed)
IV team at bedside 

## 2020-11-18 NOTE — Consult Note (Signed)
Cardiology Consultation:   Patient ID: Rossy Virag MRN: [id number]; DOB: [birth year]  Admit date: 11/17/2020 Date of Consult: 11/18/2020  Primary Care Provider: Josem Kaufmann, MD [facility] Cardiologist: No primary care provider on file.  [facility] Electrophysiologist:  None    Patient Profile:   Ikeya Brockel is a 87 y.o. female with a hx of atrial fibrillation, diabetes, hyperlipidemia, hypertension, PE, GERD, glaucoma, CKD who is being seen today for the evaluation of dizziness, presyncope at the request of Darnell Level.  History of Present Illness:   Ms. Headlee presented to the hospital yesterday after an episode of acute dizziness, nausea, and near syncope.  She was in her kitchen about 1230 preparing lunch.  Her daughter was called to check on her.  Her blood sugar was checked and was in the mid 70s.  Her pulse was checked and was was in the 30s and thus EMS was called.  She was transported to the emergency room.  She had previously been on metoprolol after a diagnosis of atrial fibrillation in 2020 when she was hospitalized with pneumonia.  She is, to her knowledge, had no further episodes.  Her metoprolol has since been stopped.  She did convert to atrial flutter while in the emergency room.  She has been started on Eliquis.  She has had multiple episodes of the same dizziness and nausea in the past.  None of them have been diagnosed as she was not seen by medical care.  She has them a few times a month.   Past Medical History:  Diagnosis Date  . Asthma due to environmental allergies   . Basal cell carcinoma 05/08/2020   bcc ant. mid neck   . Breast cancer ([place])    2003  . Cataract   . Chronic kidney disease   . Clotting disorder ([place])   . Diabetes mellitus ([place])    type II  . Diabetic coma ([place])   . GERD (gastroesophageal reflux disease)   . Glaucoma   . Hyperlipidemia   . Hypertension   . Osteoporosis   . Pneumonia   . Pulmonary embolism ([place])    . Squamous cell carcinoma of skin 10/18/2014   well diff-right upper arm  . Squamous cell carcinoma of skin 06/02/2019   in situ-upper lip (txpbx)  . Tubular adenoma of colon 2017    Past Surgical History:  Procedure Laterality Date  . APPENDECTOMY    . CATARACT EXTRACTION, BILATERAL    . MASTECTOMY     left w/ymph node removal  . SKIN CANCER EXCISION    . TONSILLECTOMY    . VAGINAL HYSTERECTOMY  1981     Home Medications:  Prior to Admission medications   Medication Sig Start Date End Date Taking? Authorizing Provider  amLODipine (NORVASC) 5 MG tablet Take 5 mg by mouth daily.   Yes [provider]  apixaban (ELIQUIS) 5 MG TABS tablet Take 5 mg by mouth 2 (two) times daily.   Yes [provider]  aspirin EC 81 MG tablet Take 81 mg by mouth daily.   Yes [provider]  brimonidine (ALPHAGAN) 0.2 % ophthalmic solution Place 1 drop into both eyes 3 (three) times daily.   Yes [provider]  Calcium Carb-Cholecalciferol (CALCIUM 600-D PO) Take 1 tablet by mouth 2 (two) times daily.   Yes [provider]  Cholecalciferol (VITAMIN D3 PO) Take 1 tablet by mouth 2 (two) times daily.   Yes [provider]  denosumab (PROLIA) 60  MG/ML SOLN injection Inject 60 mg into the skin every 6 (six) months. Administer in upper arm, thigh, or abdomen   Yes [provider]  dorzolamide-timolol (COSOPT) 22.3-6.8 MG/ML ophthalmic solution Place 1 drop into both eyes 2 (two) times daily.   Yes [provider]  esomeprazole (NEXIUM) 20 MG capsule Take 20 mg by mouth daily at 12 noon.   Yes [provider]  gabapentin (NEURONTIN) 300 MG capsule Take 300 mg by mouth 2 (two) times daily.   Yes [provider]  hydrochlorothiazide (HYDRODIURIL) 25 MG tablet Take 25 mg by mouth daily. 09/27/20  Yes [provider]  insulin degludec (TRESIBA FLEXTOUCH) 100 UNIT/ML FlexTouch Pen Inject 10-25 Units into the skin at  bedtime. Based on CBG   Yes [provider]  insulin lispro (HUMALOG) 100 UNIT/ML KwikPen Inject 10 Units into the skin daily with lunch.   Yes [provider]  latanoprost (XALATAN) 0.005 % ophthalmic solution Place 1 drop into both eyes at bedtime. 08/23/20  Yes [provider]  lisinopril (ZESTRIL) 40 MG tablet Take 40 mg by mouth daily. 09/27/20  Yes [provider]  metFORMIN (GLUCOPHAGE-XR) 500 MG 24 hr tablet Take 1,000 mg by mouth 2 (two) times daily. 06/26/20  Yes [provider]  metoprolol tartrate (LOPRESSOR) 100 MG tablet Take 100 mg by mouth 2 (two) times daily. 09/27/20  Yes [provider]  montelukast (SINGULAIR) 10 MG tablet Take 10 mg by mouth at bedtime.   Yes [provider]  rosuvastatin (CRESTOR) 40 MG tablet Take 40 mg by mouth at bedtime.   Yes [provider]  vitamin B-12 (CYANOCOBALAMIN) 500 MCG tablet Take 1,000 mcg by mouth daily.   Yes [provider]    Inpatient Medications: Scheduled Meds: . apixaban  5 mg Oral BID  . aspirin EC  81 mg Oral Daily  . brimonidine  1 drop Both Eyes TID  . calcium-vitamin D  1 tablet Oral BID WC  . cholecalciferol  1,000 Units Oral Daily  . dorzolamide-timolol  1 drop Both Eyes BID  . gabapentin  100 mg Oral BID  . hydrALAZINE  10 mg Oral Q8H  . insulin aspart  0-5 Units Subcutaneous QHS  . insulin aspart  0-6 Units Subcutaneous TID WC  . latanoprost  1 drop Both Eyes QHS  . montelukast  10 mg Oral QHS  . multivitamin with minerals  1 tablet Oral Daily  . pantoprazole  40 mg Oral Daily  . rosuvastatin  40 mg Oral QHS  . vitamin B-12  1,000 mcg Oral Daily   Continuous Infusions: . sodium chloride    . sodium chloride 50 mL/hr at 11/18/20 0101   PRN Meds: hydrALAZINE, ondansetron (ZOFRAN) IV  Allergies:    Allergies  Allergen Reactions  . Metformin Other (See Comments)    Caused stomach upset (tolerates timed release)    Social History:    Social History   Socioeconomic History  . Marital status: Single    Spouse name: Not on file  . Number of children: Not on file  . Years of education: Not on file  . Highest education level: Not on file  Occupational History  . Not on file  Tobacco Use  . Smoking status: Never Smoker  . Smokeless tobacco: Never Used  Vaping Use  . Vaping Use: Never used  Substance and Sexual Activity  . Alcohol use: No  . Drug use: No  . Sexual activity: Not on file  Other Topics Concern  . Not on file  Social History Narrative  . Not on file   Social Determinants of Health   Financial Resource Strain: Not on file  Food Insecurity: Not on file  Transportation Needs: Not on file  Physical Activity: Not on file  Stress: Not on file  Social Connections: Not on file  Intimate Partner Violence: Not on file    Family History:    Family History  Problem Relation Age of Onset  . Diabetes Mother   . CVA Mother   . Leukemia Father   . Colon cancer Neg Hx   . Stomach cancer Neg Hx   . Pancreatic cancer Neg Hx   . Esophageal cancer Neg Hx      ROS:  Please see the history of present illness.   All other ROS reviewed and negative.     Physical Exam/Data:   Vitals:   11/18/20 0415 11/18/20 0530 11/18/20 0545 11/18/20 0736  BP: 132/69 112/61 117/75 126/73  Pulse: (!) 128 (!) 102 (!) 133 (!) 125  Resp: 14 15 13 16   Temp:    98.4 F (36.9 C)  TempSrc:    Oral  SpO2: 96% 96% 98% 98%  Weight:      Height:       No intake or output data in the 24 hours ending 11/18/20 1033 Last 3 Weights 11/17/2020 08/18/2019 08/11/2019  Weight (lbs) 164 lb 167 lb 167 lb  Weight (kg) 74.39 kg 75.751 kg 75.751 kg     Body mass index is 27.29 kg/m.  General:  Well nourished, well developed, in no acute distress HEENT: normal Lymph: no adenopathy Neck: no JVD Endocrine:  No thryomegaly Vascular: No carotid bruits; FA pulses 2+ bilaterally without bruits  Cardiac: Tachycardic, regular; no  murmur  Lungs:  clear to auscultation bilaterally, no wheezing, rhonchi or rales  Abd: soft, nontender, no hepatomegaly  Ext: no edema Musculoskeletal:  No deformities, BUE and BLE strength normal and equal Skin: warm and dry  Neuro:  CNs 2-12 intact, no focal abnormalities noted Psych:  Normal affect   EKG:  The EKG was personally reviewed and demonstrates: Ectopic atrial rhythm Telemetry:  Telemetry was personally reviewed and demonstrates: Atrial flutter  Relevant CV Studies: TTE pending  Laboratory Data:  High Sensitivity Troponin:  No results for input(s): TROPONINIHS in the last 720 hours.   Chemistry Recent Labs  Lab 11/17/20 1827 11/18/20 0050 11/18/20 0249  NA 138  --  137  K 5.0  --  3.8  CL 104  --  101  CO2 22  --  24  GLUCOSE 192*  --  186*  BUN 30*  --  30*  CREATININE 2.04* 1.83* 1.77*  CALCIUM 10.0  --  9.5  GFRNONAA 24* 27* 28*  ANIONGAP 12  --  12    Recent Labs  Lab 11/18/20 0249  PROT 5.9*  ALBUMIN 3.3*  AST 22  ALT 15  ALKPHOS 28*  BILITOT 0.4   Hematology Recent Labs  Lab 11/17/20 1827 11/18/20 0050 11/18/20 0249  WBC 9.2 8.8 8.1  RBC 4.34 3.81* 3.49*  HGB 11.6* 10.4* 9.8*  HCT 38.6 33.4* 30.0*  MCV 88.9 87.7 86.0  MCH 26.7 27.3 28.1  MCHC 30.1 31.1 32.7  RDW 14.2 14.2 14.2  PLT 222 196 180   BNPNo results for input(s): BNP, PROBNP in the last 168 hours.  DDimer No results for input(s): DDIMER in the last 168 hours.   Radiology/Studies:  No results found.   Assessment and Plan:   1. Tachybradycardia syndrome with atrial flutter: Patient had an episode of near syncope.  She converted to atrial flutter in the emergency room.  She has been started on Eliquis.  And Brandalyn Harting receive her first dose this morning.  I do feel that she likely needs to get back into normal rhythm before we can further evaluate her atrial flutter and need for pacemaker implant, though with her severe bradycardia, pacemaker may be reasonable.  We Katena Petitjean make  her n.p.o. after midnight tonight.  This patients CHA2DS2-VASc Score and unadjusted Ischemic Stroke Rate (% per year) is equal to 7.2 % stroke rate/year from a score of 5  Above score calculated as 1 point each if present [CHF, HTN, DM, Vascular=MI/PAD/Aortic Plaque, Age if 65-74, or Female] Above score calculated as 2 points each if present [Age > 75, or Stroke/TIA/TE]     For questions or updates, please contact [facility] Please consult [email or web address] for contact info under    Signed, Margaret Staggs Meredith Leeds, MD  11/18/2020 10:33 AM

## 2020-11-18 NOTE — Progress Notes (Signed)
HOSPITAL MEDICINE OVERNIGHT EVENT NOTE    Notified by nursing that patient has begun to develop rose-colored urine.  No evidence of dense hematuria or evidence of blood clots.  Patient does complain of some faint dysuria when urinating however.  Urinalysis reviewed from earlier today that is leukocyte and nitrite negative with 0-5 white blood cells per high-powered field.  No evidence of urinary tract infection  Patient is currently on anticoagulation for atrial flutter, due to undergo cardioversion tomorrow.  Continuing Eliquis and continuing to monitor mild hematuria.  I have asked nursing and me know if the bleeding gets worse or patient exhibits clots in urine.  I have encouraged the patient to orally hydrate in the meantime.  Kristen Emerald  MD [facility]   ADDENDUM 6:55AM 1/31  Patient still complaining of dysuria but hematuria improving with no clots.  Patient's GFR is too low to give Pyridium for discomfort but will repeat UA/UCx.    Kristen Fox

## 2020-11-18 NOTE — Progress Notes (Signed)
PROGRESS NOTE    Kristen Fox    Code Status: Full Code  JIR:[id number] DOB: [birth year] DOA: 11/17/2020 LOS: 1 days  PCP: Josem Kaufmann, MD CC:  Chief Complaint  Patient presents with  . Vomiting  . Weakness       Hospital Summary   This is an 87 year old female from [place], [place] with past medical history of atrial fibrillation, diabetes, hypertension, hyperlipidemia, PE on Eliquis, GERD, glaucoma, type 2 diabetes who presented to the ED on 1/29 due to complaints of nausea and vomiting and dizziness for several hours duration which started around noon the day of presentation.  EKG in route per EMS showing A-V dissociation with bradycardia HR 34.  EKG in the ED showed junctional tachycardia.  Cardiology was consulted in the ED. ED Course:  Afebrile, heart rate 114, BP 122/78, respiration rate 16, O2 saturation 98% on room air.  Lab studies were remarkable for creatinine 2.04, unknown baseline, serum glucose 192, hemoglobin 11.6K.   A & P   Active Problems:   Sick sinus syndrome ([place])   1. Tachybradycardia syndrome with atrial flutter a. Converted to atrial flutter in the ED b. Per cardiology: Hold AV nodal blocking agents.  Eliquis 2.5 mg twice daily, aspirin discontinued.  Patient needs to get back to normal rhythm before cardio can further evaluate her a flutter and need for pacemaker.  Plan for DCCV tomorrow, n.p.o. after midnight  2. Presyncopal episode at home with nausea and vomiting likely symptoms from above a. Plan as above  3. Elevated creatinine a. Creatinine 2.04, unknown baseline has no prior records b. Hold further IV fluids c. Hold home lisinopril and HCTZ  4. Hypertension a. Hydralazine p.o. 3 times daily b. IV hydralazine as needed c. Holding home antihypertensives d. Avoid AV nodal blocking agents  5. Type 2 diabetes with hyperglycemia a. Continue sliding scale  6. Peripheral neuropathy a. Continue gabapentin 100 mg twice  daily  7. Osteoporosis on Prolia  8. History of PE on Eliquis a. Eliquis reduced per cardiology as above   DVT prophylaxis: apixaban (ELIQUIS) tablet 2.5 mg Start: 11/18/20 2200 SCDs Start: 11/17/20 2213 apixaban (ELIQUIS) tablet 2.5 mg   Family Communication: Patient's family at bedside has been updated   Disposition Plan:  Status is: Inpatient  Remains inpatient appropriate because:Ongoing diagnostic testing needed not appropriate for outpatient work up and Inpatient level of care appropriate due to severity of illness   Dispo:  Patient From: Home  Planned Disposition: Home  Expected discharge date: 11/19/2020  Medically stable for discharge: No             Pressure injury documentation    None  Consultants  Cardiology   Procedures  None  Antibiotics   Anti-infectives (From admission, onward)   None        Subjective   Patient seen and examined at bedside in the ED in no acute distress and resting comfortably. No acute events overnight. Denies any acute complaints at this time but states that she felt like her brain was "scrambled eggs "at home and felt very dizzy.  Also had a presyncopal event 2 weeks ago but she did not lose consciousness and fell into the door.  Currently feeling a bit better than when she was at home.  No chest pain, palpitations or shortness of breath.  Objective   Vitals:   11/18/20 1130 11/18/20 1200 11/18/20 1230 11/18/20 1330  BP: (!) 161/96 (!) 123/91 134/90 (!) 142/92  Pulse: Marland Kitchen)  132 (!) 133 (!) 130 (!) 122  Resp: 19 (!) 23 20 18   Temp:    97.9 F (36.6 C)  TempSrc:    Oral  SpO2: 97% 98% 99% 99%  Weight:    72.9 kg  Height:    5\' 5"  (1.651 m)    Intake/Output Summary (Last 24 hours) at 11/18/2020 1527 Last data filed at 11/18/2020 1500 Gross per 24 hour  Intake 692.68 ml  Output 250 ml  Net 442.68 ml   Filed Weights   11/17/20 1921 11/18/20 1330  Weight: 74.4 kg 72.9 kg    Examination:  Physical  Exam Vitals and nursing note reviewed.  Constitutional:      Appearance: Normal appearance.  HENT:     Head: Normocephalic and atraumatic.  Eyes:     Conjunctiva/sclera: Conjunctivae normal.  Cardiovascular:     Rate and Rhythm: Normal rate and regular rhythm.  Pulmonary:     Effort: Pulmonary effort is normal.     Breath sounds: Rales present.  Abdominal:     General: Abdomen is flat.     Palpations: Abdomen is soft.  Musculoskeletal:        General: No swelling or tenderness.  Skin:    Coloration: Skin is not jaundiced or pale.  Neurological:     Mental Status: She is alert. Mental status is at baseline.  Psychiatric:        Mood and Affect: Mood normal.        Behavior: Behavior normal.     Data Reviewed: I have personally reviewed following labs and imaging studies  CBC: Recent Labs  Lab 11/17/20 1827 11/18/20 0050 11/18/20 0249  WBC 9.2 8.8 8.1  HGB 11.6* 10.4* 9.8*  HCT 38.6 33.4* 30.0*  MCV 88.9 87.7 86.0  PLT 222 196 938   Basic Metabolic Panel: Recent Labs  Lab 11/17/20 1827 11/18/20 0050 11/18/20 0249 11/18/20 0800  NA 138  --  137  --   K 5.0  --  3.8  --   CL 104  --  101  --   CO2 22  --  24  --   GLUCOSE 192*  --  186*  --   BUN 30*  --  30*  --   CREATININE 2.04* 1.83* 1.77*  --   CALCIUM 10.0  --  9.5  --   MG  --   --   --  2.1   GFR: Estimated Creatinine Clearance: 23.7 mL/min (A) (by C-G formula based on SCr of 1.77 mg/dL (H)). Liver Function Tests: Recent Labs  Lab 11/18/20 0249  AST 22  ALT 15  ALKPHOS 28*  BILITOT 0.4  PROT 5.9*  ALBUMIN 3.3*   No results for input(s): LIPASE, AMYLASE in the last 168 hours. No results for input(s): AMMONIA in the last 168 hours. Coagulation Profile: No results for input(s): INR, PROTIME in the last 168 hours. Cardiac Enzymes: No results for input(s): CKTOTAL, CKMB, CKMBINDEX, TROPONINI in the last 168 hours. BNP (last 3 results) No results for input(s): PROBNP in the last 8760  hours. HbA1C: Recent Labs    11/18/20 0050  HGBA1C 7.6*   CBG: Recent Labs  Lab 11/17/20 1746 11/17/20 1928 11/18/20 0050 11/18/20 0754 11/18/20 1148  GLUCAP 183* 134* 254* 157* 171*   Lipid Profile: No results for input(s): CHOL, HDL, LDLCALC, TRIG, CHOLHDL, LDLDIRECT in the last 72 hours. Thyroid Function Tests: Recent Labs    11/18/20 0249  TSH 2.401   Anemia  Panel: No results for input(s): VITAMINB12, FOLATE, FERRITIN, TIBC, IRON, RETICCTPCT in the last 72 hours. Sepsis Labs: No results for input(s): PROCALCITON, LATICACIDVEN in the last 168 hours.  Recent Results (from the past 240 hour(s))  SARS CORONAVIRUS 2 (TAT 6-24 HRS) Nasopharyngeal Nasopharyngeal Swab     Status: None   Collection Time: 11/17/20 10:04 PM   Specimen: Nasopharyngeal Swab  Result Value Ref Range Status   SARS Coronavirus 2 NEGATIVE NEGATIVE Final    Comment: (NOTE) SARS-CoV-2 target nucleic acids are NOT DETECTED.  The SARS-CoV-2 RNA is generally detectable in upper and lower respiratory specimens during the acute phase of infection. Negative results do not preclude SARS-CoV-2 infection, do not rule out co-infections with other pathogens, and should not be used as the sole basis for treatment or other patient management decisions. Negative results must be combined with clinical observations, patient history, and epidemiological information. The expected result is Negative.  Fact Sheet for Patients: SugarRoll.be  Fact Sheet for Healthcare Providers: https://www.woods-mathews.com/  This test is not yet approved or cleared by the [place] FDA and  has been authorized for detection and/or diagnosis of SARS-CoV-2 by FDA under an Emergency Use Authorization (EUA). This EUA will remain  in effect (meaning this test can be used) for the duration of the COVID-19 declaration under Se ction 564(b)(1) of the Act, 21 U.S.C. section 360bbb-3(b)(1),  unless the authorization is terminated or revoked sooner.  Performed at [facility], [place] [address], [place], [place] [address]          Radiology Studies: No results found.      Scheduled Meds: . apixaban  2.5 mg Oral BID  . aspirin EC  81 mg Oral Daily  . brimonidine  1 drop Both Eyes TID  . calcium-vitamin D  1 tablet Oral BID WC  . cholecalciferol  1,000 Units Oral Daily  . dorzolamide-timolol  1 drop Both Eyes BID  . gabapentin  100 mg Oral BID  . hydrALAZINE  10 mg Oral Q8H  . insulin aspart  0-5 Units Subcutaneous QHS  . insulin aspart  0-6 Units Subcutaneous TID WC  . latanoprost  1 drop Both Eyes QHS  . montelukast  10 mg Oral QHS  . multivitamin with minerals  1 tablet Oral Daily  . pantoprazole  40 mg Oral Daily  . rosuvastatin  40 mg Oral QHS  . vitamin B-12  1,000 mcg Oral Daily   Continuous Infusions: . sodium chloride 50 mL/hr at 11/18/20 0101     Time spent: 25 minutes with over 50% of the time coordinating the patient's care    Harold Hedge, DO [facility]   Call night coverage person covering after 7pm

## 2020-11-18 NOTE — Progress Notes (Signed)
Dr. Curt Bears requests DCCV only tomorrow. He reports he discussed procedure with patient for consent. Pre DCCV orders written per our discussion. Message sent to cardmaster to arrange tomorrow.

## 2020-11-19 ENCOUNTER — Inpatient Hospital Stay (HOSPITAL_COMMUNITY): Payer: Medicare Other

## 2020-11-19 ENCOUNTER — Encounter (HOSPITAL_COMMUNITY)
Admission: EM | Disposition: A | Discharge status: Home / Self Care | Payer: Self-pay | Source: Home / Self Care | Attending: Internal Medicine

## 2020-11-19 DIAGNOSIS — I480 Paroxysmal atrial fibrillation: Secondary | ICD-10-CM | POA: Diagnosis not present

## 2020-11-19 DIAGNOSIS — I495 Sick sinus syndrome: Secondary | ICD-10-CM

## 2020-11-19 DIAGNOSIS — R300 Dysuria: Secondary | ICD-10-CM

## 2020-11-19 HISTORY — PX: PACEMAKER IMPLANT: EP1218

## 2020-11-19 LAB — GLUCOSE, CAPILLARY
Glucose-Capillary: 256 mg/dL — ABNORMAL HIGH (ref 70–99)
Glucose-Capillary: 267 mg/dL — ABNORMAL HIGH (ref 70–99)
Glucose-Capillary: 178 mg/dL — ABNORMAL HIGH (ref 70–99)
Glucose-Capillary: 226 mg/dL — ABNORMAL HIGH (ref 70–99)

## 2020-11-19 LAB — URINALYSIS, COMPLETE (UACMP) WITH MICROSCOPIC
Bilirubin Urine: NEGATIVE
Glucose, UA: >=500 mg/dL — AB
Ketones, ur: NEGATIVE mg/dL
Nitrite: NEGATIVE
Protein, ur: 100 mg/dL — AB
RBC / HPF: >50 RBC/hpf — ABNORMAL HIGH (ref 0–5)
Specific Gravity, Urine: 1.014 (ref 1.005–1.030)
WBC, UA: >50 WBC/hpf — ABNORMAL HIGH (ref 0–5)
pH: 6 (ref 5.0–8.0)

## 2020-11-19 LAB — BASIC METABOLIC PANEL
Anion gap: 11 (ref 5–15)
BUN: 27 mg/dL — ABNORMAL HIGH (ref 8–23)
CO2: 21 mmol/L — ABNORMAL LOW (ref 22–32)
Calcium: 9.4 mg/dL (ref 8.9–10.3)
Chloride: 103 mmol/L (ref 98–111)
Creatinine, Ser: 1.57 mg/dL — ABNORMAL HIGH (ref 0.44–1.00)
GFR, Estimated: 32 mL/min — ABNORMAL LOW (ref >60)
Glucose, Bld: 310 mg/dL — ABNORMAL HIGH (ref 70–99)
Potassium: 4.1 mmol/L (ref 3.5–5.1)
Sodium: 135 mmol/L (ref 135–145)

## 2020-11-19 LAB — CBC
HCT: 33.7 % — ABNORMAL LOW (ref 36.0–46.0)
Hemoglobin: 11.3 g/dL — ABNORMAL LOW (ref 12.0–15.0)
MCH: 28.3 pg (ref 26.0–34.0)
MCHC: 33.5 g/dL (ref 30.0–36.0)
MCV: 84.5 fL (ref 80.0–100.0)
Platelets: 193 10*3/uL (ref 150–400)
RBC: 3.99 MIL/uL (ref 3.87–5.11)
RDW: 14 % (ref 11.5–15.5)
WBC: 11.1 10*3/uL — ABNORMAL HIGH (ref 4.0–10.5)
nRBC: 0 % (ref 0.0–0.2)

## 2020-11-19 LAB — SURGICAL PCR SCREEN
MRSA, PCR: NEGATIVE
Staphylococcus aureus: NEGATIVE

## 2020-11-19 SURGERY — PACEMAKER IMPLANT

## 2020-11-19 MED ORDER — FENTANYL CITRATE (PF) 100 MCG/2ML IJ SOLN
INTRAMUSCULAR | Status: DC | PRN
  Administered 2020-11-19 (×2): 12.5 ug via INTRAVENOUS

## 2020-11-19 MED ORDER — SODIUM CHLORIDE 0.9 % IV SOLN
INTRAVENOUS | Status: AC
  Filled 2020-11-19: qty 2

## 2020-11-19 MED ORDER — CHLORHEXIDINE GLUCONATE 4 % EX LIQD
60.0000 mL | Freq: Once | CUTANEOUS | Status: AC
Start: 2020-11-19 — End: 2020-11-19

## 2020-11-19 MED ORDER — OFF THE BEAT BOOK
Freq: Once | Status: AC
  Filled 2020-11-19 (×2): qty 1

## 2020-11-19 MED ORDER — CHLORHEXIDINE GLUCONATE 4 % EX LIQD
CUTANEOUS | Status: AC
  Administered 2020-11-19: 4 via TOPICAL
  Filled 2020-11-19: qty 45

## 2020-11-19 MED ORDER — INSULIN GLARGINE 100 UNIT/ML ~~LOC~~ SOLN
10.0000 [IU] | Freq: Every day | SUBCUTANEOUS | Status: DC
  Administered 2020-11-19: 10 [IU] via SUBCUTANEOUS
  Filled 2020-11-19 (×2): qty 0.1

## 2020-11-19 MED ORDER — SODIUM CHLORIDE 0.9 % IV SOLN: INTRAVENOUS | Status: DC

## 2020-11-19 MED ORDER — LIDOCAINE HCL 1 % IJ SOLN
INTRAMUSCULAR | Status: AC
  Filled 2020-11-19: qty 60

## 2020-11-19 MED ORDER — MIDAZOLAM HCL 5 MG/5ML IJ SOLN
INTRAMUSCULAR | Status: DC | PRN
  Administered 2020-11-19 (×2): 0.5 mg via INTRAVENOUS

## 2020-11-19 MED ORDER — VERAPAMIL HCL 2.5 MG/ML IV SOLN
INTRAVENOUS | Status: DC | PRN
  Administered 2020-11-19: 2.5 mg via INTRAVENOUS

## 2020-11-19 MED ORDER — SODIUM CHLORIDE 0.9% FLUSH: 3.0000 mL | INTRAVENOUS | Status: DC | PRN

## 2020-11-19 MED ORDER — SODIUM CHLORIDE 0.9% FLUSH
3.0000 mL | Freq: Two times a day (BID) | INTRAVENOUS | Status: DC
  Administered 2020-11-19 – 2020-11-21 (×5): 3 mL via INTRAVENOUS

## 2020-11-19 MED ORDER — ONDANSETRON HCL 4 MG/2ML IJ SOLN
4.0000 mg | Freq: Four times a day (QID) | INTRAMUSCULAR | Status: DC | PRN
  Administered 2020-11-19: 4 mg via INTRAVENOUS
  Filled 2020-11-19: qty 2

## 2020-11-19 MED ORDER — LIDOCAINE HCL (PF) 1 % IJ SOLN
INTRAMUSCULAR | Status: DC | PRN
  Administered 2020-11-19: 55 mL

## 2020-11-19 MED ORDER — SODIUM CHLORIDE 0.9 % IV SOLN: INTRAVENOUS | Status: DC | PRN

## 2020-11-19 MED ORDER — ACETAMINOPHEN 325 MG PO TABS
650.0000 mg | ORAL_TABLET | ORAL | Status: DC | PRN
  Administered 2020-11-19 – 2020-11-20 (×3): 650 mg via ORAL
  Filled 2020-11-19 (×3): qty 2

## 2020-11-19 MED ORDER — CEFAZOLIN SODIUM-DEXTROSE 2-4 GM/100ML-% IV SOLN
2.0000 g | INTRAVENOUS | Status: AC
  Administered 2020-11-19: 2 g via INTRAVENOUS
  Filled 2020-11-19: qty 100

## 2020-11-19 MED ORDER — CHLORHEXIDINE GLUCONATE 4 % EX LIQD
CUTANEOUS | Status: AC
  Filled 2020-11-19: qty 15

## 2020-11-19 MED ORDER — SODIUM CHLORIDE 0.9 % IV SOLN
80.0000 mg | INTRAVENOUS | Status: AC
  Administered 2020-11-19: 80 mg
  Filled 2020-11-19: qty 2

## 2020-11-19 MED ORDER — INSULIN ASPART 100 UNIT/ML ~~LOC~~ SOLN
0.0000 [IU] | Freq: Three times a day (TID) | SUBCUTANEOUS | Status: DC
  Administered 2020-11-19: 5 [IU] via SUBCUTANEOUS
  Administered 2020-11-19: 2 [IU] via SUBCUTANEOUS

## 2020-11-19 MED ORDER — SODIUM CHLORIDE 0.9 % IV SOLN: 250.0000 mL | INTRAVENOUS | Status: DC

## 2020-11-19 MED ORDER — METOPROLOL TARTRATE 50 MG PO TABS
50.0000 mg | ORAL_TABLET | Freq: Two times a day (BID) | ORAL | Status: DC
  Administered 2020-11-19: 50 mg via ORAL
  Filled 2020-11-19: qty 1

## 2020-11-19 MED ORDER — MIDAZOLAM HCL 5 MG/5ML IJ SOLN
INTRAMUSCULAR | Status: AC
  Filled 2020-11-19: qty 5

## 2020-11-19 MED ORDER — SODIUM CHLORIDE 0.9 % IV SOLN
1.0000 g | INTRAVENOUS | Status: DC
  Administered 2020-11-20 – 2020-11-21 (×2): 1 g via INTRAVENOUS
  Filled 2020-11-19 (×2): qty 10

## 2020-11-19 MED ORDER — CEFAZOLIN SODIUM-DEXTROSE 1-4 GM/50ML-% IV SOLN
1.0000 g | Freq: Four times a day (QID) | INTRAVENOUS | Status: DC
  Filled 2020-11-19 (×2): qty 50

## 2020-11-19 MED ORDER — FENTANYL CITRATE (PF) 100 MCG/2ML IJ SOLN
INTRAMUSCULAR | Status: AC
  Filled 2020-11-19: qty 2

## 2020-11-19 MED ORDER — HEPARIN (PORCINE) IN NACL 1000-0.9 UT/500ML-% IV SOLN
INTRAVENOUS | Status: AC
  Filled 2020-11-19: qty 500

## 2020-11-19 MED ORDER — CEFAZOLIN SODIUM-DEXTROSE 2-4 GM/100ML-% IV SOLN
INTRAVENOUS | Status: AC
  Filled 2020-11-19: qty 100

## 2020-11-19 MED ORDER — HEPARIN (PORCINE) IN NACL 1000-0.9 UT/500ML-% IV SOLN
INTRAVENOUS | Status: DC | PRN
  Administered 2020-11-19: 500 mL

## 2020-11-19 MED ORDER — VERAPAMIL HCL 2.5 MG/ML IV SOLN
INTRAVENOUS | Status: AC
  Filled 2020-11-19: qty 2

## 2020-11-19 MED ORDER — ACETAMINOPHEN 325 MG PO TABS: 325.0000 mg | ORAL_TABLET | ORAL | Status: DC | PRN

## 2020-11-19 MED ORDER — SODIUM CHLORIDE 0.9 % IV SOLN
250.0000 mL | INTRAVENOUS | Status: DC
  Administered 2020-11-19: 250 mL via INTRAVENOUS

## 2020-11-19 SURGICAL SUPPLY — 10 items
CABLE SURGICAL S-101-97-12 (CABLE) ×2 IMPLANT
IPG PACE AZUR XT DR MRI W1DR01 (Pacemaker) ×1 IMPLANT
KIT MICROPUNCTURE NIT STIFF (SHEATH) ×2 IMPLANT
LEAD CAPSURE NOVUS 5076-52CM (Lead) ×2 IMPLANT
LEAD CAPSURE NOVUS 5076-58CM (Lead) ×2 IMPLANT
PACE AZURE XT DR MRI W1DR01 (Pacemaker) ×2 IMPLANT
PAD PRO RADIOLUCENT 2001M-C (PAD) ×2 IMPLANT
SHEATH 7FR PRELUDE SNAP 13 (SHEATH) ×4 IMPLANT
TRAY PACEMAKER INSERTION (PACKS) ×2 IMPLANT
WIRE HI TORQ VERSACORE-J 145CM (WIRE) IMPLANT

## 2020-11-19 NOTE — Plan of Care (Signed)
  Problem: Nutrition: Goal: Adequate nutrition will be maintained Outcome: Progressing   

## 2020-11-19 NOTE — Progress Notes (Signed)
Late entry 0645: Notified Dr. Cyd Silence of patient continued dysuria, and urine now cloudy and malodorous with elevated WBC.  Repeat U/A ordered.  Pt for DCCV today and notified cardiology of patient's continued tachycardia 100-150s, asymptomatic.  Cardiology to round this am.

## 2020-11-19 NOTE — Progress Notes (Addendum)
Progress Note  Patient Name: Kristen Fox Date of Encounter: 11/19/2020  [facility] Cardiologist: new to [facility]  Subjective   No CP, palpitations, no SOB, c/o urinary frequency and burning  Inpatient Medications    Scheduled Meds: . apixaban  2.5 mg Oral BID  . aspirin EC  81 mg Oral Daily  . brimonidine  1 drop Both Eyes TID  . calcium-vitamin D  1 tablet Oral BID WC  . cholecalciferol  1,000 Units Oral Daily  . dorzolamide-timolol  1 drop Both Eyes BID  . gabapentin  100 mg Oral BID  . hydrALAZINE  10 mg Oral Q8H  . insulin aspart  0-5 Units Subcutaneous QHS  . insulin aspart  0-6 Units Subcutaneous TID WC  . latanoprost  1 drop Both Eyes QHS  . montelukast  10 mg Oral QHS  . multivitamin with minerals  1 tablet Oral Daily  . off the beat book   Does not apply Once  . pantoprazole  40 mg Oral Daily  . rosuvastatin  40 mg Oral QHS  . vitamin B-12  1,000 mcg Oral Daily   Continuous Infusions:  PRN Meds: acetaminophen, hydrALAZINE, ondansetron (ZOFRAN) IV   Vital Signs    Vitals:   11/18/20 2214 11/19/20 0008 11/19/20 0424 11/19/20 0428  BP: (!) 148/83 (!) 123/93 (!) 110/57 125/75  Pulse: (!) 127 (!) 147 (!) 152 (!) 151  Resp: 18 16 16    Temp: 98.2 F (36.8 C) 98.1 F (36.7 C) 98.4 F (36.9 C)   TempSrc: Axillary Oral Oral   SpO2: 98% 98% 99% 99%  Weight:    73.4 kg  Height:        Intake/Output Summary (Last 24 hours) at 11/19/2020 0725 Last data filed at 11/19/2020 0600 Gross per 24 hour  Intake 932.68 ml  Output 950 ml  Net -17.32 ml   Last 3 Weights 11/19/2020 11/18/2020 11/17/2020  Weight (lbs) 161 lb 13.1 oz 160 lb 11.5 oz 164 lb  Weight (kg) 73.4 kg 72.9 kg 74.39 kg      Telemetry    AFib 130's-150'a - Personally Reviewed  ECG    No new EKGs - Personally Reviewed  Physical Exam   GEN: No acute distress.   Neck: No JVD Cardiac: irreg-irreg, tachycardic, no murmurs, rubs, or gallops.  Respiratory: CTA b/l. GI: Soft, nontender,  non-distended  MS: No edema; No deformity. Neuro:  Nonfocal  Psych: Normal affect   Labs    High Sensitivity Troponin:  No results for input(s): TROPONINIHS in the last 720 hours.    Chemistry Recent Labs  Lab 11/17/20 1827 11/18/20 0050 11/18/20 0249 11/19/20 0222  NA 138  --  137 135  K 5.0  --  3.8 4.1  CL 104  --  101 103  CO2 22  --  24 21*  GLUCOSE 192*  --  186* 310*  BUN 30*  --  30* 27*  CREATININE 2.04* 1.83* 1.77* 1.57*  CALCIUM 10.0  --  9.5 9.4  PROT  --   --  5.9*  --   ALBUMIN  --   --  3.3*  --   AST  --   --  22  --   ALT  --   --  15  --   ALKPHOS  --   --  28*  --   BILITOT  --   --  0.4  --   GFRNONAA 24* 27* 28* 32*  ANIONGAP 12  --  12 11     Hematology Recent Labs  Lab 11/18/20 0050 11/18/20 0249 11/19/20 0222  WBC 8.8 8.1 11.1*  RBC 3.81* 3.49* 3.99  HGB 10.4* 9.8* 11.3*  HCT 33.4* 30.0* 33.7*  MCV 87.7 86.0 84.5  MCH 27.3 28.1 28.3  MCHC 31.1 32.7 33.5  RDW 14.2 14.2 14.0  PLT 196 180 193    BNPNo results for input(s): BNP, PROBNP in the last 168 hours.   DDimer No results for input(s): DDIMER in the last 168 hours.   Radiology    No results found.  Cardiac Studies   Echo is ordered and pending  Patient Profile     87 y.o. female w/PMHx of AFib, PE, (on chronic Eliquis), GERD, CKD, DM, HLD, HTN. Glaucoma admitted with near syncope, weakness, symptomatic bradycardia  Assessment & Plan    1. Tachy-brady  Presents with symptomatic bradycardia, rates 30's by EMS tracings Home metoprolol held (100mg  BID) Has subsequently returned to initially what looked an ATach/AFlutter > AFib w/RVR  BP stable, she is unaware of her tachycardia, denies CP or SOB  I hesitate to DCCV then implant pacer and be unable to hold Eliquis. I will discuss with Dr. Lovena Le plans and order  ADDEND: Dr. Lovena Le has seen and examined the patient. Will plan PPM and rate control afterwards and eventual DCCV out patient in 3-4 weeks Hold eliquis  this AM In d/w Dr. Lovena Le, hold off echo while tachycardic, plan to get echo once rate controlled. No need pre-pacer  Dr. Lovena Le has discussed implant procedure, potential risks and benefits with the patient and daughter at bedside, they are agreeable  2. HTN     Follow, currently off he meds   3. Paroxysmal AFib     Known for her     CHA2DS2Vasc is 5, on Eliquis     As above   Further as per medicine team:  4. C/o some urinary frequency and burning     Started 2 days ago     UA yesterday looked OK     Afebrile     WBC 11.1     deferred to IM  5. DM   For questions or updates, please contact [place] Please consult [email or web address] for contact info under    Signed, Baldwin Jamaica, PA-C  11/19/2020, 7:25 AM    EP Attending  Patient seen and examined. Discussed the treatment with the patient and her daughter. She has had symptomatic bradycardia due to sinus node dysfunction and metoprolol and now with cessation of her beta blocker has atrial fib with a RVR. I have discussed the treatment options with the patient and her daughter. The indications/risks/benefits/goals/expectations of DDD PM insertion were reveiwed and she is willing to proceed.  Carleene Overlie Chitara Clonch,MD

## 2020-11-19 NOTE — Progress Notes (Signed)
PT Cancellation Note  Patient Details Name: Kristen Fox MRN: [id number] DOB: [birth year]   Cancelled Treatment:    Reason Eval/Treat Not Completed: Medical issues which prohibited therapy. Pt going for pacer later this PM. Will follow up tomorrow for readiness for therapy.    Shary [facility] 11/19/2020, 2:51 PM Leslie Jester [place] Pager [phone] Office [phone]

## 2020-11-19 NOTE — Progress Notes (Signed)
PROGRESS NOTE    Kristen Fox    Code Status: Full Code  UYQ:[id number] DOB: [birth year] DOA: 11/17/2020 LOS: 2 days  PCP: Josem Kaufmann, MD CC:  Chief Complaint  Patient presents with  . Vomiting  . Weakness       Hospital Summary   This is an 87 year old female from [place], [place] with past medical history of atrial fibrillation, diabetes, hypertension, hyperlipidemia, PE on Eliquis, GERD, glaucoma, type 2 diabetes who presented to the ED on 1/29 due to complaints of nausea and vomiting and dizziness for several hours duration which started around noon the day of presentation.  EKG in route per EMS showing A-V dissociation with bradycardia HR 34.  EKG in the ED showed junctional tachycardia.  Cardiology was consulted in the ED. ED Course:  Afebrile, heart rate 114, BP 122/78, respiration rate 16, O2 saturation 98% on room air.  Lab studies were remarkable for creatinine 2.04, unknown baseline, serum glucose 192, hemoglobin 11.6K.   A & P   Active Problems:   Sick sinus syndrome ([place])   1. Symptomatic Tachybradycardia syndrome with atrial flutter a. Plan for PPM today  2. Presyncopal episode at home with nausea and vomiting likely symptoms from above a. Plan as above  3. Dysuria, Concern for UTI a. UA yesterday unremarkable but symptoms started after urinalysis and now with leukocytosis b. Urine at bedside is cloudy and patient has lower abdominal tenderness to palpation over the bladder c. Will repeat UA and likely start antibiotics - already received ancef for surgical prophylaxis for today  d. Check blood cultures  4. Elevated creatinine a. Creatinine 2.04-> 1.57 with GFR 32, unknown baseline has no prior records b. Hold home lisinopril and HCTZ  5. Hypertension a. Hydralazine p.o. 3 times daily b. IV hydralazine as needed c. Holding home antihypertensives d. Avoid AV nodal blocking agents  6. Type 2 diabetes with hyperglycemia a. Add lantus 10  u b. Increase sliding scale from very sensitive to sensitive  7. Peripheral neuropathy a. Continue gabapentin 100 mg twice daily  8. Osteoporosis on Prolia  9. History of PE on Eliquis a. Eliquis reduced per cardiology   DVT prophylaxis: SCDs Start: 11/17/20 2213   Family Communication: Patient's family at bedside has been updated today  Disposition Plan:  Status is: Inpatient  Remains inpatient appropriate because:Ongoing diagnostic testing needed not appropriate for outpatient work up and Inpatient level of care appropriate due to severity of illness   Dispo:  Patient From: Home  Planned Disposition: Home  Expected discharge date: 11/20/2020  Medically stable for discharge: No             Pressure injury documentation    None  Consultants  Cardiology   Procedures  None  Antibiotics   Anti-infectives (From admission, onward)   Start     Dose/Rate Route Frequency Ordered Stop   11/19/20 1145  gentamicin (GARAMYCIN) 80 mg in sodium chloride 0.9 % 500 mL irrigation        80 mg Irrigation On call 11/19/20 1056 11/20/20 1145   11/19/20 1145  ceFAZolin (ANCEF) IVPB 2g/100 mL premix        2 g 200 mL/hr over 30 Minutes Intravenous On call 11/19/20 1056 11/20/20 1145        Subjective   Overnight with dysuria. Patient currently complaining of significant dysuria. Urine is cloudy at bedside. She states that she has had a UTI in the distant past that felt similar. Symptoms started yesterday afternoon-evening.  No fevers but does feel sweats.   Otherwise no chest pain, palpitations or other complaints at this time.   Objective   Vitals:   11/19/20 0424 11/19/20 0428 11/19/20 0731 11/19/20 1358  BP: (!) 110/57 125/75 (!) 157/92 131/69  Pulse: (!) 152 (!) 151 (!) 153   Resp: 16  16   Temp: 98.4 F (36.9 C)  98.4 F (36.9 C)   TempSrc: Oral  Oral   SpO2: 99% 99% 98%   Weight:  73.4 kg    Height:        Intake/Output Summary (Last 24 hours) at  11/19/2020 1410 Last data filed at 11/19/2020 0600 Gross per 24 hour  Intake 932.68 ml  Output 700 ml  Net 232.68 ml   Filed Weights   11/17/20 1921 11/18/20 1330 11/19/20 0428  Weight: 74.4 kg 72.9 kg 73.4 kg    Examination:  Physical Exam Vitals and nursing note reviewed.  Constitutional:      Appearance: Normal appearance.  HENT:     Head: Normocephalic and atraumatic.  Eyes:     Conjunctiva/sclera: Conjunctivae normal.  Cardiovascular:     Rate and Rhythm: Normal rate and regular rhythm.  Pulmonary:     Effort: Pulmonary effort is normal.     Breath sounds: Normal breath sounds.  Abdominal:     General: Abdomen is flat.     Palpations: Abdomen is soft.     Tenderness: There is abdominal tenderness in the suprapubic area.  Musculoskeletal:        General: No swelling or tenderness.  Skin:    Coloration: Skin is not jaundiced or pale.  Neurological:     Mental Status: She is alert. Mental status is at baseline.  Psychiatric:        Mood and Affect: Mood normal.        Behavior: Behavior normal.     Data Reviewed: I have personally reviewed following labs and imaging studies  CBC: Recent Labs  Lab 11/17/20 1827 11/18/20 0050 11/18/20 0249 11/19/20 0222  WBC 9.2 8.8 8.1 11.1*  HGB 11.6* 10.4* 9.8* 11.3*  HCT 38.6 33.4* 30.0* 33.7*  MCV 88.9 87.7 86.0 84.5  PLT 222 196 180 627   Basic Metabolic Panel: Recent Labs  Lab 11/17/20 1827 11/18/20 0050 11/18/20 0249 11/18/20 0800 11/19/20 0222  NA 138  --  137  --  135  K 5.0  --  3.8  --  4.1  CL 104  --  101  --  103  CO2 22  --  24  --  21*  GLUCOSE 192*  --  186*  --  310*  BUN 30*  --  30*  --  27*  CREATININE 2.04* 1.83* 1.77*  --  1.57*  CALCIUM 10.0  --  9.5  --  9.4  MG  --   --   --  2.1  --    GFR: Estimated Creatinine Clearance: 26.8 mL/min (A) (by C-G formula based on SCr of 1.57 mg/dL (H)). Liver Function Tests: Recent Labs  Lab 11/18/20 0249  AST 22  ALT 15  ALKPHOS 28*   BILITOT 0.4  PROT 5.9*  ALBUMIN 3.3*   No results for input(s): LIPASE, AMYLASE in the last 168 hours. No results for input(s): AMMONIA in the last 168 hours. Coagulation Profile: No results for input(s): INR, PROTIME in the last 168 hours. Cardiac Enzymes: No results for input(s): CKTOTAL, CKMB, CKMBINDEX, TROPONINI in the last 168 hours. BNP (last  3 results) No results for input(s): PROBNP in the last 8760 hours. HbA1C: Recent Labs    11/18/20 0050  HGBA1C 7.6*   CBG: Recent Labs  Lab 11/18/20 1148 11/18/20 1631 11/18/20 2130 11/19/20 0729 11/19/20 1126  GLUCAP 171* 274* 343* 256* 267*   Lipid Profile: No results for input(s): CHOL, HDL, LDLCALC, TRIG, CHOLHDL, LDLDIRECT in the last 72 hours. Thyroid Function Tests: Recent Labs    11/18/20 0249  TSH 2.401   Anemia Panel: No results for input(s): VITAMINB12, FOLATE, FERRITIN, TIBC, IRON, RETICCTPCT in the last 72 hours. Sepsis Labs: No results for input(s): PROCALCITON, LATICACIDVEN in the last 168 hours.  Recent Results (from the past 240 hour(s))  SARS CORONAVIRUS 2 (TAT 6-24 HRS) Nasopharyngeal Nasopharyngeal Swab     Status: None   Collection Time: 11/17/20 10:04 PM   Specimen: Nasopharyngeal Swab  Result Value Ref Range Status   SARS Coronavirus 2 NEGATIVE NEGATIVE Final    Comment: (NOTE) SARS-CoV-2 target nucleic acids are NOT DETECTED.  The SARS-CoV-2 RNA is generally detectable in upper and lower respiratory specimens during the acute phase of infection. Negative results do not preclude SARS-CoV-2 infection, do not rule out co-infections with other pathogens, and should not be used as the sole basis for treatment or other patient management decisions. Negative results must be combined with clinical observations, patient history, and epidemiological information. The expected result is Negative.  Fact Sheet for Patients: SugarRoll.be  Fact Sheet for Healthcare  Providers: https://www.woods-mathews.com/  This test is not yet approved or cleared by the [place] FDA and  has been authorized for detection and/or diagnosis of SARS-CoV-2 by FDA under an Emergency Use Authorization (EUA). This EUA will remain  in effect (meaning this test can be used) for the duration of the COVID-19 declaration under Se ction 564(b)(1) of the Act, 21 U.S.C. section 360bbb-3(b)(1), unless the authorization is terminated or revoked sooner.  Performed at [facility], [place] [address], [place], [place] [address]   Surgical PCR screen     Status: None   Collection Time: 11/19/20 11:20 AM   Specimen: Nasal Mucosa; Nasal Swab  Result Value Ref Range Status   MRSA, PCR NEGATIVE NEGATIVE Final   Staphylococcus aureus NEGATIVE NEGATIVE Final    Comment: (NOTE) The Xpert SA Assay (FDA approved for NASAL specimens in patients 58 years of age and older), is one component of a comprehensive surveillance program. It is not intended to diagnose infection nor to guide or monitor treatment. Performed at [facility], [place] [address] [place], [place] [address]          Radiology Studies: No results found.      Scheduled Meds: . aspirin EC  81 mg Oral Daily  . brimonidine  1 drop Both Eyes TID  . calcium-vitamin D  1 tablet Oral BID WC  . cholecalciferol  1,000 Units Oral Daily  . dorzolamide-timolol  1 drop Both Eyes BID  . gabapentin  100 mg Oral BID  . gentamicin irrigation  80 mg Irrigation On Call  . hydrALAZINE  10 mg Oral Q8H  . insulin aspart  0-5 Units Subcutaneous QHS  . insulin aspart  0-9 Units Subcutaneous TID WC  . insulin glargine  10 Units Subcutaneous Daily  . latanoprost  1 drop Both Eyes QHS  . montelukast  10 mg Oral QHS  . multivitamin with minerals  1 tablet Oral Daily  . pantoprazole  40 mg Oral Daily  . rosuvastatin  40 mg Oral QHS  . sodium chloride flush  3 mL Intravenous Q12H  . vitamin B-12  1,000 mcg  Oral Daily   Continuous Infusions: . sodium chloride 50 mL/hr at 11/19/20 1222  . sodium chloride 250 mL (11/19/20 1322)  .  ceFAZolin (ANCEF) IV       Time spent: 28 minutes with over 50% of the time coordinating the patient's care    Harold Hedge, DO [facility]   Call night coverage person covering after 7pm

## 2020-11-19 NOTE — Progress Notes (Signed)
Will stop asa Hold Apixoban  Until pm 2/1 Resume metoprolol tartrate 50 bid

## 2020-11-19 NOTE — Progress Notes (Signed)
Pt seen  My schedule permits earlier device implantation and family is agreeable to my doing in that situation

## 2020-11-20 ENCOUNTER — Encounter (HOSPITAL_COMMUNITY): Payer: Self-pay | Admitting: Internal Medicine

## 2020-11-20 ENCOUNTER — Inpatient Hospital Stay (HOSPITAL_COMMUNITY): Payer: Medicare Other

## 2020-11-20 ENCOUNTER — Other Ambulatory Visit (HOSPITAL_COMMUNITY): Payer: Medicare Other

## 2020-11-20 DIAGNOSIS — I480 Paroxysmal atrial fibrillation: Secondary | ICD-10-CM

## 2020-11-20 DIAGNOSIS — R350 Frequency of micturition: Secondary | ICD-10-CM

## 2020-11-20 DIAGNOSIS — I10 Essential (primary) hypertension: Secondary | ICD-10-CM | POA: Diagnosis not present

## 2020-11-20 DIAGNOSIS — I495 Sick sinus syndrome: Secondary | ICD-10-CM | POA: Diagnosis not present

## 2020-11-20 DIAGNOSIS — N390 Urinary tract infection, site not specified: Secondary | ICD-10-CM

## 2020-11-20 LAB — GLUCOSE, CAPILLARY
Glucose-Capillary: 199 mg/dL — ABNORMAL HIGH (ref 70–99)
Glucose-Capillary: 454 mg/dL — ABNORMAL HIGH (ref 70–99)
Glucose-Capillary: 337 mg/dL — ABNORMAL HIGH (ref 70–99)
Glucose-Capillary: 353 mg/dL — ABNORMAL HIGH (ref 70–99)

## 2020-11-20 LAB — BASIC METABOLIC PANEL
Anion gap: 14 (ref 5–15)
BUN: 32 mg/dL — ABNORMAL HIGH (ref 8–23)
CO2: 19 mmol/L — ABNORMAL LOW (ref 22–32)
Calcium: 9.4 mg/dL (ref 8.9–10.3)
Chloride: 103 mmol/L (ref 98–111)
Creatinine, Ser: 1.92 mg/dL — ABNORMAL HIGH (ref 0.44–1.00)
GFR, Estimated: 25 mL/min — ABNORMAL LOW (ref >60)
Glucose, Bld: 243 mg/dL — ABNORMAL HIGH (ref 70–99)
Potassium: 4.6 mmol/L (ref 3.5–5.1)
Sodium: 136 mmol/L (ref 135–145)

## 2020-11-20 LAB — CBC
HCT: 39.5 % (ref 36.0–46.0)
Hemoglobin: 12.9 g/dL (ref 12.0–15.0)
MCH: 28 pg (ref 26.0–34.0)
MCHC: 32.7 g/dL (ref 30.0–36.0)
MCV: 85.7 fL (ref 80.0–100.0)
Platelets: 202 10*3/uL (ref 150–400)
RBC: 4.61 MIL/uL (ref 3.87–5.11)
RDW: 14.3 % (ref 11.5–15.5)
WBC: 11.7 10*3/uL — ABNORMAL HIGH (ref 4.0–10.5)
nRBC: 0 % (ref 0.0–0.2)

## 2020-11-20 MED ORDER — INSULIN ASPART 100 UNIT/ML ~~LOC~~ SOLN
0.0000 [IU] | Freq: Three times a day (TID) | SUBCUTANEOUS | Status: DC
  Administered 2020-11-20: 11 [IU] via SUBCUTANEOUS
  Administered 2020-11-20: 15 [IU] via SUBCUTANEOUS
  Administered 2020-11-21: 3 [IU] via SUBCUTANEOUS
  Administered 2020-11-21: 15 [IU] via SUBCUTANEOUS

## 2020-11-20 MED ORDER — METOPROLOL TARTRATE 50 MG PO TABS
50.0000 mg | ORAL_TABLET | Freq: Three times a day (TID) | ORAL | Status: DC
  Administered 2020-11-20 (×3): 50 mg via ORAL
  Filled 2020-11-20 (×3): qty 1

## 2020-11-20 MED ORDER — DIGOXIN 125 MCG PO TABS: 0.1250 mg | ORAL_TABLET | Freq: Every day | ORAL | Status: DC

## 2020-11-20 MED ORDER — INSULIN GLARGINE 100 UNIT/ML ~~LOC~~ SOLN
15.0000 [IU] | Freq: Every day | SUBCUTANEOUS | Status: DC
  Administered 2020-11-20 – 2020-11-21 (×2): 15 [IU] via SUBCUTANEOUS
  Filled 2020-11-20 (×2): qty 0.15

## 2020-11-20 MED ORDER — DIGOXIN 250 MCG PO TABS
0.2500 mg | ORAL_TABLET | Freq: Three times a day (TID) | ORAL | Status: AC
  Administered 2020-11-20 (×3): 0.25 mg via ORAL
  Filled 2020-11-20 (×3): qty 1

## 2020-11-20 MED FILL — Lidocaine HCl Local Inj 1%: INTRAMUSCULAR | Qty: 60 | Status: AC

## 2020-11-20 NOTE — Evaluation (Signed)
Physical Therapy Evaluation Patient Details Name: Kristen Fox MRN: [id number] DOB: [birth year] Today's Date: 11/20/2020   History of Present Illness  Pt adm with nausea and vomiting and found to have tachy brady syndrome and had pacer placed 1/31. PMH - HTN, DM, afib, PE, breast CA  Clinical Impression  Pt presents to PT with slightly decreased mobility. Expect pt will make good progress back to baseline with mobility. Pt motivated and daughter will assist with in room and hallway mobility.  Will follow acutely but doubt pt will need PT after DC.      Follow Up Recommendations No PT follow up    Equipment Recommendations  None recommended by PT    Recommendations for Other Services       Precautions / Restrictions Precautions Precautions: ICD/Pacemaker Restrictions Weight Bearing Restrictions: No      Mobility  Bed Mobility               General bed mobility comments: Pt up in chair    Transfers Overall transfer level: Needs assistance Equipment used: None Transfers: Sit to/from Stand Sit to Stand: Supervision         General transfer comment: Verbal cues for hand placement  Ambulation/Gait Ambulation/Gait assistance: Supervision Gait Distance (Feet): 225 Feet Assistive device: None;IV Pole;4-wheeled walker Gait Pattern/deviations: Step-through pattern;Decreased stride length Gait velocity: decr Gait velocity interpretation: 1.31 - 2.62 ft/sec, indicative of limited community ambulator General Gait Details: Assist for safety and lines. No instability noted  Stairs            Wheelchair Mobility    Modified Rankin (Stroke Patients Only)       Balance Overall balance assessment: Mild deficits observed, not formally tested                                           Pertinent Vitals/Pain Pain Assessment: Faces Faces Pain Scale: Hurts a little bit Pain Location: pacer site Pain Descriptors / Indicators: Sore Pain  Intervention(s): Limited activity within patient's tolerance    Home Living Family/patient expects to be discharged to:: Private residence Living Arrangements: Alone Available Help at Discharge: Family;Available PRN/intermittently Type of Home: House Home Access: Ramped entrance     Home Layout: Two level;Laundry or work area in [employer]: None      Prior Function Level of Independence: Independent               Journalist, newspaper   Dominant Hand: Right    Extremity/Trunk Assessment   Upper Extremity Assessment Upper Extremity Assessment: LUE deficits/detail LUE Deficits / Details: limited by pacer restrictions    Lower Extremity Assessment Lower Extremity Assessment: Overall WFL for tasks assessed       Communication   Communication: No difficulties  Cognition Arousal/Alertness: Awake/alert Behavior During Therapy: WFL for tasks assessed/performed Overall Cognitive Status: Within Functional Limits for tasks assessed                                        General Comments General comments (skin integrity, edema, etc.): VSS on RA    Exercises     Assessment/Plan    PT Assessment Patient needs continued PT services  PT Problem List Decreased mobility       PT Treatment Interventions Gait training;Functional mobility training;Therapeutic  activities;Therapeutic exercise;Patient/family education;DME instruction    PT Goals (Current goals can be found in the Care Plan section)  Acute Rehab PT Goals Patient Stated Goal: return home PT Goal Formulation: With patient Time For Goal Achievement: 11/27/20 Potential to Achieve Goals: Good    Frequency Min 3X/week   Barriers to discharge        Co-evaluation               AM-PAC PT "6 Clicks" Mobility  Outcome Measure Help needed turning from your back to your side while in a flat bed without using bedrails?: A Little Help needed moving from lying on your back to  sitting on the side of a flat bed without using bedrails?: A Little Help needed moving to and from a bed to a chair (including a wheelchair)?: A Little Help needed standing up from a chair using your arms (e.g., wheelchair or bedside chair)?: A Little Help needed to walk in hospital room?: A Little Help needed climbing 3-5 steps with a railing? : A Little 6 Click Score: 18    End of Session   Activity Tolerance: Patient tolerated treatment well Patient left: in chair;with call bell/phone within reach;with family/visitor present Nurse Communication: Mobility status PT Visit Diagnosis: Other abnormalities of gait and mobility (R26.89)    Time: 2761-8485 PT Time Calculation (min) (ACUTE ONLY): 22 min   Charges:   PT Evaluation $PT Eval Moderate Complexity: [place] Pager [phone] Office [place] 11/20/2020, 11:34 AM

## 2020-11-20 NOTE — Progress Notes (Signed)
PROGRESS NOTE    Kristen Fox    Code Status: Full Code  JAS:[id number] DOB: Jun 21, 1934 DOA: 11/17/2020 LOS: 3 days  PCP: Josem Kaufmann, MD CC:  Chief Complaint  Patient presents with  . Vomiting  . Weakness       Hospital Summary   This is an 87 year old female from [place], [place] with past medical history of atrial fibrillation, diabetes, hypertension, hyperlipidemia, PE on Eliquis, GERD, glaucoma, type 2 diabetes who presented to the ED on 1/29 due to complaints of nausea and vomiting and dizziness for several hours duration which started around noon the day of presentation.  EKG in route per EMS showing A-V dissociation with bradycardia HR 34.  EKG in the ED showed junctional tachycardia.  Cardiology was consulted in the ED.  ED Course:  Afebrile, heart rate 114, BP 122/78, respiration rate 16, O2 saturation 98% on room air.  Lab studies were remarkable for creatinine 2.04, unknown baseline, serum glucose 192, hemoglobin 11.6K.  1/31: s/p PPM with Dr. Caryl Comes. Also complaining of dysuria - UA positive and started on antibiotics   A & P   Active Problems:   Sick sinus syndrome ([place])   1. Symptomatic Tachybrady with atrial flutter with RVR s/p PPM 11/19/20 with Dr. Caryl Comes a. Wound healing b. On Digoxin and metoprolol tartrate c. Echo tomorrow d. Resume eliquis tomorrow e. Appreciate other recommendations per Cardio  2. Presyncopal episode at home with nausea and vomiting likely secondary to #1 a. Plan as above  3. E coli UTI a. Received Ancef prior to surgical procedure followed by Ceftriaxone b. Follow up sensitivities and blood cultures  4. Elevated creatinine a. Creatinine 2.04-> 1.57 -> 1.92, unknown baseline has no prior records b. Hold home lisinopril and HCTZ  5. Hypertension a. On lopressor   6. Type 2 diabetes with hyperglycemia a. Increase lantus to 15 u and Increase sliding scale to moderate  7. Peripheral neuropathy a. Continue gabapentin 100 mg  twice daily  8. Osteoporosis on Prolia  9. History of PE on Eliquis a. Eliquis per cardiology   DVT prophylaxis: SCDs Start: 11/19/20 1736 Place and maintain sequential compression device Start: 11/19/20 1723 SCDs Start: 11/17/20 2213   Family Communication: Patient's family at bedside has been updated yesterday  Disposition Plan: Likely DC tomorrow pending cardiology sign off and with UTI treatment  Status is: Inpatient  Remains inpatient appropriate because:Ongoing diagnostic testing needed not appropriate for outpatient work up and Inpatient level of care appropriate due to severity of illness   Dispo:  Patient From: Home  Planned Disposition: Home  Expected discharge date: 11/21/2020  Medically stable for discharge: No             Pressure injury documentation    None  Consultants  Cardiology   Procedures  1/31 PPM  Antibiotics   Anti-infectives (From admission, onward)   Start     Dose/Rate Route Frequency Ordered Stop   11/20/20 0600  cefTRIAXone (ROCEPHIN) 1 g in sodium chloride 0.9 % 100 mL IVPB       Note to Pharmacy: Received Ancef today   1 g 200 mL/hr over 30 Minutes Intravenous Every 24 hours 11/19/20 1907     11/19/20 2200  ceFAZolin (ANCEF) IVPB 1 g/50 mL premix  Status:  Discontinued        1 g 100 mL/hr over 30 Minutes Intravenous Every 6 hours 11/19/20 1735 11/19/20 1912   11/19/20 1647  gentamicin (GARAMYCIN) 80 mg in sodium chloride 0.9 %  500 mL irrigation  Status:  Discontinued          As needed 11/19/20 1701 11/19/20 1717   11/19/20 1145  gentamicin (GARAMYCIN) 80 mg in sodium chloride 0.9 % 500 mL irrigation        80 mg Irrigation On call 11/19/20 1056 11/19/20 1659   11/19/20 1145  ceFAZolin (ANCEF) IVPB 2g/100 mL premix        2 g 200 mL/hr over 30 Minutes Intravenous On call 11/19/20 1056 11/19/20 1626        Subjective   Dysuria has significantly improved and patient is feeling much better. She denies any chest pain or  palpitations. No other issues or complaints.   Objective   Vitals:   11/20/20 0718 11/20/20 0900 11/20/20 1139 11/20/20 1307  BP: 119/88  (!) 90/53   Pulse: 97  93   Resp: 17  16   Temp: 98.5 F (36.9 C)  98.5 F (36.9 C)   TempSrc: Oral  Oral   SpO2: 99% 99% 100% 98%  Weight:      Height:        Intake/Output Summary (Last 24 hours) at 11/20/2020 1516 Last data filed at 11/20/2020 1300 Gross per 24 hour  Intake 964.79 ml  Output --  Net 964.79 ml   Filed Weights   11/17/20 1921 11/18/20 1330 11/19/20 0428  Weight: 74.4 kg 72.9 kg 73.4 kg    Examination:  Physical Exam Vitals and nursing note reviewed.  Constitutional:      Appearance: Normal appearance.  HENT:     Head: Normocephalic and atraumatic.  Eyes:     Conjunctiva/sclera: Conjunctivae normal.  Cardiovascular:     Rate and Rhythm: Tachycardia present. Rhythm irregular.     Comments: Left upper chest healing surgical scar Pulmonary:     Effort: Pulmonary effort is normal.     Breath sounds: Normal breath sounds.  Abdominal:     General: Abdomen is flat. There is no distension.     Palpations: Abdomen is soft.     Tenderness: There is no abdominal tenderness.  Musculoskeletal:        General: No swelling or tenderness.  Skin:    Coloration: Skin is not jaundiced or pale.  Neurological:     Mental Status: She is alert. Mental status is at baseline.  Psychiatric:        Mood and Affect: Mood normal.        Behavior: Behavior normal.     Data Reviewed: I have personally reviewed following labs and imaging studies  CBC: Recent Labs  Lab 11/17/20 1827 11/18/20 0050 11/18/20 0249 11/19/20 0222 11/20/20 0732  WBC 9.2 8.8 8.1 11.1* 11.7*  HGB 11.6* 10.4* 9.8* 11.3* 12.9  HCT 38.6 33.4* 30.0* 33.7* 39.5  MCV 88.9 87.7 86.0 84.5 85.7  PLT 222 196 180 193 416   Basic Metabolic Panel: Recent Labs  Lab 11/17/20 1827 11/18/20 0050 11/18/20 0249 11/18/20 0800 11/19/20 0222 11/20/20 0634  NA  138  --  137  --  135 136  K 5.0  --  3.8  --  4.1 4.6  CL 104  --  101  --  103 103  CO2 22  --  24  --  21* 19*  GLUCOSE 192*  --  186*  --  310* 243*  BUN 30*  --  30*  --  27* 32*  CREATININE 2.04* 1.83* 1.77*  --  1.57* 1.92*  CALCIUM 10.0  --  9.5  --  9.4 9.4  MG  --   --   --  2.1  --   --    GFR: Estimated Creatinine Clearance: 21.9 mL/min (A) (by C-G formula based on SCr of 1.92 mg/dL (H)). Liver Function Tests: Recent Labs  Lab 11/18/20 0249  AST 22  ALT 15  ALKPHOS 28*  BILITOT 0.4  PROT 5.9*  ALBUMIN 3.3*   No results for input(s): LIPASE, AMYLASE in the last 168 hours. No results for input(s): AMMONIA in the last 168 hours. Coagulation Profile: No results for input(s): INR, PROTIME in the last 168 hours. Cardiac Enzymes: No results for input(s): CKTOTAL, CKMB, CKMBINDEX, TROPONINI in the last 168 hours. BNP (last 3 results) No results for input(s): PROBNP in the last 8760 hours. HbA1C: Recent Labs    11/18/20 0050  HGBA1C 7.6*   CBG: Recent Labs  Lab 11/19/20 1126 11/19/20 1757 11/19/20 2200 11/20/20 0801 11/20/20 1134  GLUCAP 267* 178* 226* 199* 454*   Lipid Profile: No results for input(s): CHOL, HDL, LDLCALC, TRIG, CHOLHDL, LDLDIRECT in the last 72 hours. Thyroid Function Tests: Recent Labs    11/18/20 0249  TSH 2.401   Anemia Panel: No results for input(s): VITAMINB12, FOLATE, FERRITIN, TIBC, IRON, RETICCTPCT in the last 72 hours. Sepsis Labs: No results for input(s): PROCALCITON, LATICACIDVEN in the last 168 hours.  Recent Results (from the past 240 hour(s))  SARS CORONAVIRUS 2 (TAT 6-24 HRS) Nasopharyngeal Nasopharyngeal Swab     Status: None   Collection Time: 11/17/20 10:04 PM   Specimen: Nasopharyngeal Swab  Result Value Ref Range Status   SARS Coronavirus 2 NEGATIVE NEGATIVE Final    Comment: (NOTE) SARS-CoV-2 target nucleic acids are NOT DETECTED.  The SARS-CoV-2 RNA is generally detectable in upper and lower respiratory  specimens during the acute phase of infection. Negative results do not preclude SARS-CoV-2 infection, do not rule out co-infections with other pathogens, and should not be used as the sole basis for treatment or other patient management decisions. Negative results must be combined with clinical observations, patient history, and epidemiological information. The expected result is Negative.  Fact Sheet for Patients: SugarRoll.be  Fact Sheet for Healthcare Providers: https://www.woods-mathews.com/  This test is not yet approved or cleared by the [place] FDA and  has been authorized for detection and/or diagnosis of SARS-CoV-2 by FDA under an Emergency Use Authorization (EUA). This EUA will remain  in effect (meaning this test can be used) for the duration of the COVID-19 declaration under Se ction 564(b)(1) of the Act, 21 U.S.C. section 360bbb-3(b)(1), unless the authorization is terminated or revoked sooner.  Performed at [facility], [place] [address], [place], [place] [address]   Surgical PCR screen     Status: None   Collection Time: 11/19/20 11:20 AM   Specimen: Nasal Mucosa; Nasal Swab  Result Value Ref Range Status   MRSA, PCR NEGATIVE NEGATIVE Final   Staphylococcus aureus NEGATIVE NEGATIVE Final    Comment: (NOTE) The Xpert SA Assay (FDA approved for NASAL specimens in patients 54 years of age and older), is one component of a comprehensive surveillance program. It is not intended to diagnose infection nor to guide or monitor treatment. Performed at [facility], [place] [address], [place], [place] [address]   Culture, Urine     Status: Abnormal (Preliminary result)   Collection Time: 11/19/20  2:10 PM   Specimen: Urine, Random  Result Value Ref Range Status   Specimen Description  URINE, RANDOM  Final   Special Requests NONE  Final   Culture (A)  Final    >=100,000 COLONIES/mL ESCHERICHIA COLI SUSCEPTIBILITIES  TO FOLLOW Performed at [facility], [address], [place],  [address]    Report Status PENDING  Incomplete         Radiology Studies: DG Chest 2 View  Result Date: 11/20/2020 CLINICAL DATA:  Post pacer.  Sore arm. EXAM: CHEST - 2 VIEW COMPARISON:  No prior. FINDINGS: Cardiac pacer with lead tip over the right atrium right ventricle. Heart size normal. Low lung volumes. No focal infiltrate. No pleural effusion or pneumothorax. IMPRESSION: 1. Cardiac pacer with lead tip over the right atrium and right ventricle. No pneumothorax. 2. Low lung volumes. No acute cardiopulmonary disease. Electronically Signed   By: Marcello Moores  Register   On: 11/20/2020 07:33        Scheduled Meds: . brimonidine  1 drop Both Eyes TID  . calcium-vitamin D  1 tablet Oral BID WC  . cholecalciferol  1,000 Units Oral Daily  . [START ON 11/21/2020] digoxin  0.125 mg Oral Daily  . digoxin  0.25 mg Oral Q8H  . dorzolamide-timolol  1 drop Both Eyes BID  . gabapentin  100 mg Oral BID  . insulin aspart  0-15 Units Subcutaneous TID WC  . insulin aspart  0-5 Units Subcutaneous QHS  . insulin glargine  15 Units Subcutaneous Daily  . latanoprost  1 drop Both Eyes QHS  . metoprolol tartrate  50 mg Oral TID  . montelukast  10 mg Oral QHS  . multivitamin with minerals  1 tablet Oral Daily  . pantoprazole  40 mg Oral Daily  . rosuvastatin  40 mg Oral QHS  . sodium chloride flush  3 mL Intravenous Q12H  . vitamin B-12  1,000 mcg Oral Daily   Continuous Infusions: . cefTRIAXone (ROCEPHIN)  IV 1 g (11/20/20 0636)     Time spent: 26  minutes with over 50% of the time coordinating the patient's care    Harold Hedge, DO [facility]   Call night coverage person covering after 7pm

## 2020-11-20 NOTE — Progress Notes (Addendum)
Progress Note  Patient Name: Kristen Fox Date of Encounter: 11/20/2020  [facility] Cardiologist: new to [facility]  Subjective   No CP, SOB, achy at pacer site  Inpatient Medications    Scheduled Meds: . brimonidine  1 drop Both Eyes TID  . calcium-vitamin D  1 tablet Oral BID WC  . cholecalciferol  1,000 Units Oral Daily  . [START ON 11/21/2020] digoxin  0.125 mg Oral Daily  . digoxin  0.25 mg Oral Q8H  . dorzolamide-timolol  1 drop Both Eyes BID  . gabapentin  100 mg Oral BID  . insulin aspart  0-5 Units Subcutaneous QHS  . insulin aspart  0-9 Units Subcutaneous TID WC  . insulin glargine  10 Units Subcutaneous Daily  . latanoprost  1 drop Both Eyes QHS  . metoprolol tartrate  50 mg Oral TID  . montelukast  10 mg Oral QHS  . multivitamin with minerals  1 tablet Oral Daily  . pantoprazole  40 mg Oral Daily  . rosuvastatin  40 mg Oral QHS  . sodium chloride flush  3 mL Intravenous Q12H  . vitamin B-12  1,000 mcg Oral Daily   Continuous Infusions: . cefTRIAXone (ROCEPHIN)  IV 1 g (11/20/20 0636)   PRN Meds: acetaminophen, acetaminophen, hydrALAZINE, ondansetron (ZOFRAN) IV   Vital Signs    Vitals:   11/19/20 1700 11/19/20 1735 11/19/20 1835 11/19/20 2100  BP:  136/87  103/69  Pulse:  (!) 152 (!) 127 75  Resp:  15  16  Temp:  98 F (36.7 C)  98.3 F (36.8 C)  TempSrc:  Oral  Oral  SpO2: 98%   100%  Weight:      Height:        Intake/Output Summary (Last 24 hours) at 11/20/2020 0733 Last data filed at 11/19/2020 1800 Gross per 24 hour  Intake 364.79 ml  Output --  Net 364.79 ml   Last 3 Weights 11/19/2020 11/18/2020 11/17/2020  Weight (lbs) 161 lb 13.1 oz 160 lb 11.5 oz 164 lb  Weight (kg) 73.4 kg 72.9 kg 74.39 kg      Telemetry    AFib 110's-120's - Personally Reviewed  ECG    No new EKGs - Personally Reviewed  Physical Exam   GEN: No acute distress.   Neck: No JVD Cardiac: irreg-irreg, tachycardic, no murmurs, rubs, or gallops.  Respiratory:  CTA b/l. GI: Soft, nontender, non-distended  MS: No edema; No deformity. Neuro:  Nonfocal  Psych: Normal affect   Pacer site is stable, no bleeding or hematoma  Labs    High Sensitivity Troponin:  No results for input(s): TROPONINIHS in the last 720 hours.    Chemistry Recent Labs  Lab 11/17/20 1827 11/18/20 0050 11/18/20 0249 11/19/20 0222  NA 138  --  137 135  K 5.0  --  3.8 4.1  CL 104  --  101 103  CO2 22  --  24 21*  GLUCOSE 192*  --  186* 310*  BUN 30*  --  30* 27*  CREATININE 2.04* 1.83* 1.77* 1.57*  CALCIUM 10.0  --  9.5 9.4  PROT  --   --  5.9*  --   ALBUMIN  --   --  3.3*  --   AST  --   --  22  --   ALT  --   --  15  --   ALKPHOS  --   --  28*  --   BILITOT  --   --  0.4  --   GFRNONAA 24* 27* 28* 32*  ANIONGAP 12  --  12 11     Hematology Recent Labs  Lab 11/18/20 0050 11/18/20 0249 11/19/20 0222  WBC 8.8 8.1 11.1*  RBC 3.81* 3.49* 3.99  HGB 10.4* 9.8* 11.3*  HCT 33.4* 30.0* 33.7*  MCV 87.7 86.0 84.5  MCH 27.3 28.1 28.3  MCHC 31.1 32.7 33.5  RDW 14.2 14.2 14.0  PLT 196 180 193    BNPNo results for input(s): BNP, PROBNP in the last 168 hours.   DDimer No results for input(s): DDIMER in the last 168 hours.   Radiology    No results found.  Cardiac Studies   Echo is ordered and pending  Patient Profile     87 y.o. female w/PMHx of AFib, PE, (on chronic Eliquis), GERD, CKD, DM, HLD, HTN. Glaucoma admitted with near syncope, weakness, symptomatic bradycardia  Assessment & Plan    1. Tachy-brady  Presents with symptomatic bradycardia, rates 30's by EMS tracings Home metoprolol held (100mg  BID) Has subsequently returned to initially what looked an ATach/AFlutter > AFib w/RVR  S/p PPM yesterday Site is stable CXR is reviewed with Dr. Caryl Comes with stable lead position, no ptx Device check this morning with stable measurements Wound care and activity instructions were reviewed with the patient Post pacer follow up is in place   2.  HTN     Looks OK, not a lot of room for BB titration   3. Paroxysmal AFib     Known for her     CHA2DS2Vasc is 5, on Eliquis >> held for pacer yesterday     Rates improved, not controlled  Stop hydralazine Increase metoprolol to 50mg  TID Add dig Resume Eliquis TOMORROW, dose pending renal function     Echo tomorrow Likely OK for discharge tomorrow from our perspective   4. C/o some urinary frequency and burning     Started 3 days ago  >> much better today     UA Sunday ooked OK     Afebrile     WBC 11.1 yesterday     On abx     deferred to IM  5. DM   For questions or updates, please contact [place] Please consult [email or web address] for contact info under    Signed, Baldwin Jamaica, PA-C  11/20/2020, 7:33 AM    As above   Will work on rate control, and ambulation and hopefully home n the am

## 2020-11-21 ENCOUNTER — Inpatient Hospital Stay (HOSPITAL_COMMUNITY): Payer: Medicare Other

## 2020-11-21 DIAGNOSIS — N184 Chronic kidney disease, stage 4 (severe): Secondary | ICD-10-CM | POA: Diagnosis present

## 2020-11-21 DIAGNOSIS — E663 Overweight: Secondary | ICD-10-CM

## 2020-11-21 DIAGNOSIS — E119 Type 2 diabetes mellitus without complications: Secondary | ICD-10-CM

## 2020-11-21 DIAGNOSIS — R9431 Abnormal electrocardiogram [ECG] [EKG]: Secondary | ICD-10-CM | POA: Diagnosis not present

## 2020-11-21 DIAGNOSIS — I10 Essential (primary) hypertension: Secondary | ICD-10-CM | POA: Diagnosis present

## 2020-11-21 DIAGNOSIS — N390 Urinary tract infection, site not specified: Secondary | ICD-10-CM | POA: Diagnosis present

## 2020-11-21 DIAGNOSIS — Z86711 Personal history of pulmonary embolism: Secondary | ICD-10-CM | POA: Diagnosis present

## 2020-11-21 LAB — ECHOCARDIOGRAM COMPLETE
Height: 65 in
S' Lateral: 3.2 cm
Weight: 2730.18 oz

## 2020-11-21 LAB — BASIC METABOLIC PANEL
Anion gap: 10 (ref 5–15)
BUN: 41 mg/dL — ABNORMAL HIGH (ref 8–23)
CO2: 21 mmol/L — ABNORMAL LOW (ref 22–32)
Calcium: 8.7 mg/dL — ABNORMAL LOW (ref 8.9–10.3)
Chloride: 104 mmol/L (ref 98–111)
Creatinine, Ser: 1.94 mg/dL — ABNORMAL HIGH (ref 0.44–1.00)
GFR, Estimated: 25 mL/min — ABNORMAL LOW (ref >60)
Glucose, Bld: 202 mg/dL — ABNORMAL HIGH (ref 70–99)
Potassium: 4.2 mmol/L (ref 3.5–5.1)
Sodium: 135 mmol/L (ref 135–145)

## 2020-11-21 LAB — URINE CULTURE: Culture: >=100000 — AB

## 2020-11-21 LAB — GLUCOSE, CAPILLARY
Glucose-Capillary: 183 mg/dL — ABNORMAL HIGH (ref 70–99)
Glucose-Capillary: 438 mg/dL — ABNORMAL HIGH (ref 70–99)
Glucose-Capillary: 370 mg/dL — ABNORMAL HIGH (ref 70–99)

## 2020-11-21 LAB — CBC
HCT: 34.1 % — ABNORMAL LOW (ref 36.0–46.0)
Hemoglobin: 11 g/dL — ABNORMAL LOW (ref 12.0–15.0)
MCH: 27.4 pg (ref 26.0–34.0)
MCHC: 32.3 g/dL (ref 30.0–36.0)
MCV: 85 fL (ref 80.0–100.0)
Platelets: 143 10*3/uL — ABNORMAL LOW (ref 150–400)
RBC: 4.01 MIL/uL (ref 3.87–5.11)
RDW: 14.2 % (ref 11.5–15.5)
WBC: 9.8 10*3/uL (ref 4.0–10.5)
nRBC: 0 % (ref 0.0–0.2)

## 2020-11-21 MED ORDER — METOPROLOL TARTRATE 100 MG PO TABS
100.0000 mg | ORAL_TABLET | Freq: Two times a day (BID) | ORAL | Status: DC
  Administered 2020-11-21: 100 mg via ORAL
  Filled 2020-11-21: qty 1

## 2020-11-21 MED ORDER — METOPROLOL TARTRATE 25 MG PO TABS
25.0000 mg | ORAL_TABLET | Freq: Four times a day (QID) | ORAL | Status: DC
  Administered 2020-11-21: 25 mg via ORAL
  Filled 2020-11-21: qty 1

## 2020-11-21 MED ORDER — GABAPENTIN 100 MG PO CAPS: 100.0000 mg | ORAL_CAPSULE | Freq: Two times a day (BID) | ORAL | 1 refills | Status: AC

## 2020-11-21 MED ORDER — APIXABAN 2.5 MG PO TABS
2.5000 mg | ORAL_TABLET | Freq: Two times a day (BID) | ORAL | Status: DC
  Administered 2020-11-21: 2.5 mg via ORAL
  Filled 2020-11-21: qty 1

## 2020-11-21 MED ORDER — APIXABAN 2.5 MG PO TABS: 2.5000 mg | ORAL_TABLET | Freq: Two times a day (BID) | ORAL | 0 refills | Status: AC

## 2020-11-21 MED ORDER — CEFDINIR 300 MG PO CAPS: 300.0000 mg | ORAL_CAPSULE | Freq: Every day | ORAL | 0 refills | Status: AC

## 2020-11-21 MED ORDER — CIPROFLOXACIN HCL 250 MG PO TABS: 250.0000 mg | ORAL_TABLET | Freq: Once | ORAL | 0 refills | Status: DC

## 2020-11-21 NOTE — Progress Notes (Addendum)
Progress Note  Patient Name: Kristen Fox Date of Encounter: 11/21/2020  [facility] Cardiologist: new to [facility]  Subjective   No sob or pain but soreness at pm site   Inpatient Medications    Scheduled Meds: . brimonidine  1 drop Both Eyes TID  . calcium-vitamin D  1 tablet Oral BID WC  . cholecalciferol  1,000 Units Oral Daily  . digoxin  0.125 mg Oral Daily  . dorzolamide-timolol  1 drop Both Eyes BID  . gabapentin  100 mg Oral BID  . insulin aspart  0-15 Units Subcutaneous TID WC  . insulin aspart  0-5 Units Subcutaneous QHS  . insulin glargine  15 Units Subcutaneous Daily  . latanoprost  1 drop Both Eyes QHS  . metoprolol tartrate  25 mg Oral Q6H  . montelukast  10 mg Oral QHS  . multivitamin with minerals  1 tablet Oral Daily  . pantoprazole  40 mg Oral Daily  . rosuvastatin  40 mg Oral QHS  . sodium chloride flush  3 mL Intravenous Q12H  . vitamin B-12  1,000 mcg Oral Daily   Continuous Infusions: . cefTRIAXone (ROCEPHIN)  IV 1 g (11/21/20 0619)   PRN Meds: acetaminophen, acetaminophen, ondansetron (ZOFRAN) IV   Vital Signs    Vitals:   11/20/20 2103 11/21/20 0355 11/21/20 0608 11/21/20 0609  BP: (!) 174/82 (!) 123/59 122/66 122/66  Pulse: 99 97 (!) 114 (!) 114  Resp: 18 19 20 20   Temp: 97.7 F (36.5 C) 98.5 F (36.9 C) 98.1 F (36.7 C) 98.1 F (36.7 C)  TempSrc: Oral Oral Oral   SpO2: 99% 98% 98%   Weight:  77.4 kg    Height:        Intake/Output Summary (Last 24 hours) at 11/21/2020 0721 Last data filed at 11/20/2020 2231 Gross per 24 hour  Intake 1003 ml  Output --  Net 1003 ml   Last 3 Weights 11/21/2020 11/19/2020 11/18/2020  Weight (lbs) 170 lb 10.2 oz 161 lb 13.1 oz 160 lb 11.5 oz  Weight (kg) 77.4 kg 73.4 kg 72.9 kg      Telemetry    AFib 80-110s - Personally Reviewed  ECG    No new EKGs - Personally Reviewed  Physical Exam   GEN: No acute distress.   Neck: No JVD Cardiac: irreg-irreg, tachycardic, no murmurs, rubs, or  gallops.  Respiratory: CTA b/l. GI: Soft, nontender, non-distended  MS: No edema; No deformity. Neuro:  Nonfocal  Psych: Normal affect   Pacer site is stable, no bleeding or hematoma  Labs    High Sensitivity Troponin:  No results for input(s): TROPONINIHS in the last 720 hours.    Chemistry Recent Labs  Lab 11/18/20 0249 11/19/20 0222 11/20/20 0634 11/21/20 0200  NA 137 135 136 135  K 3.8 4.1 4.6 4.2  CL 101 103 103 104  CO2 24 21* 19* 21*  GLUCOSE 186* 310* 243* 202*  BUN 30* 27* 32* 41*  CREATININE 1.77* 1.57* 1.92* 1.94*  CALCIUM 9.5 9.4 9.4 8.7*  PROT 5.9*  --   --   --   ALBUMIN 3.3*  --   --   --   AST 22  --   --   --   ALT 15  --   --   --   ALKPHOS 28*  --   --   --   BILITOT 0.4  --   --   --   GFRNONAA 28* 32* 25* 25*  ANIONGAP 12 11 14 10      Hematology Recent Labs  Lab 11/19/20 0222 11/20/20 0732 11/21/20 0200  WBC 11.1* 11.7* 9.8  RBC 3.99 4.61 4.01  HGB 11.3* 12.9 11.0*  HCT 33.7* 39.5 34.1*  MCV 84.5 85.7 85.0  MCH 28.3 28.0 27.4  MCHC 33.5 32.7 32.3  RDW 14.0 14.3 14.2  PLT 193 202 143*    BNPNo results for input(s): BNP, PROBNP in the last 168 hours.   DDimer No results for input(s): DDIMER in the last 168 hours.   Radiology    DG Chest 2 View  Result Date: 11/20/2020 CLINICAL DATA:  Post pacer.  Sore arm. EXAM: CHEST - 2 VIEW COMPARISON:  No prior. FINDINGS: Cardiac pacer with lead tip over the right atrium right ventricle. Heart size normal. Low lung volumes. No focal infiltrate. No pleural effusion or pneumothorax. IMPRESSION: 1. Cardiac pacer with lead tip over the right atrium and right ventricle. No pneumothorax. 2. Low lung volumes. No acute cardiopulmonary disease. Electronically Signed   By: Marcello Moores  Register   On: 11/20/2020 07:33    Cardiac Studies   Echo is ordered and pending  Patient Profile     87 y.o. female w/PMHx of AFib, PE, (on chronic Eliquis), GERD, CKD, DM, HLD, HTN. Glaucoma admitted with near syncope,  weakness, symptomatic bradycardia  Assessment & Plan    1. Tachy-brady  Presents with symptomatic bradycardia, rates 30's by EMS tracings Home metoprolol held (100mg  BID) Has subsequently returned to initially what looked an ATach/AFlutter > AFib w/RVR  S/p PPM 11/19/20 Site is stable CXR POD 1 was with stable lead position, no ptx Device check POD 1 was with stable measurements Wound care and activity instructions were reviewed with the patient Post pacer follow up is in place   2. HTN     Looks OK   3. Paroxysmal AFib     Known for her     CHA2DS2Vasc is 5, on Eliquis >> held for pacer      Rates improved  off hydralazine, off home HCTZ and ACE 2/2 renal function as well Resume Eliquis, will need dose reduction to 2.5mg  BID Echo today prior to d/c, can follow result in the office  Given renal function and age, digoxin daily not ideal, will d/w Dr. Caryl Comes BP with more room for lopressor I will stop dig and increase her metoprolol to home dose, 100mg  BID  OK for Eliquis today 2.5mg  BID  Dr. Caryl Comes will see later this AM       For questions or updates, please contact [facility] Please consult [email or web address] for contact info under    Signed, Baldwin Jamaica, PA-C  11/21/2020, 7:21 AM    Seen and agree   We will have to see how HR control goes but ok to hold dig for now-- would agree that her dose of 0.125 would be excessive and she would prob tolerate 0.0625   Prerenal, will encourage PO intake  Ok to discharge  Will need HR reassessment at wound check  Will decrease Mode switch rate to 60  To avoid v pacing

## 2020-11-21 NOTE — Discharge Instructions (Signed)
After Your Pacemaker   You have a [employer] Pacemaker   Do not lift your arm above shoulder height for 1 week after your procedure. After 7 days, you may progress as below.     Tuesday November 27, 2020  Wednesday November 28, 2020 Thursday November 29, 2020 Friday November 30, 2020    Do not lift, push, pull, or carry anything over 10 pounds with the affected arm until 6 weekS after your procedure.    Monitor your pacemaker site for redness, swelling, and drainage. Call the device clinic at [phone] if you experience these symptoms or fever/chills.   your incision is closed dermabond (skin glue):  You may shower today, avoid any rubbing/scrubbing area,  PAT DRY. Avoid lotions, ointments, or perfumes over your incision until it is well-healed.  Do not peel off glue   You may use a hot tub or a pool AFTER your wound check appointment if the incision is completely closed.   NO driving for 1 week.   Your Pacemaker is not MRI compatible.   Remote monitoring is used to monitor your pacemaker from home. This monitoring is scheduled every 91 days by our office. It allows [place] to keep an eye on the functioning of your device to ensure it is working properly. You will routinely see your Electrophysiologist annually (more often if necessary).

## 2020-11-21 NOTE — Progress Notes (Signed)
  Echocardiogram 2D Echocardiogram has been performed.  Kristen Fox 11/21/2020, 10:45 AM

## 2020-11-21 NOTE — Discharge Summary (Addendum)
Discharge Summary  Kristen Fox JOA:[id number] DOB: May 02, 1934  PCP: Josem Kaufmann, MD  Admit date: 11/17/2020 Discharge date: 11/21/2020  Time spent: 25 minutes  Recommendations for Outpatient Follow-up:  1. Medication change: Patient will stop aspirin 2. Medication change: Neurontin decreased from 300 twice daily to 100 twice daily 3. New medication: Cefdinir 300 p.o. daily x2 days 4. Medication change: Eliquis decreased from 5 mg twice a day to 2.5 mg twice a day  Discharge Diagnoses:  Active Hospital Problems   Diagnosis Date Noted  . Tachy-brady syndrome ([place]) 11/17/2020  . Overweight (BMI 25.0-29.9) 11/21/2020  . Hypertension   . Diabetes mellitus ([place])   . History of pulmonary embolism   . CKD (chronic kidney disease), stage IV [facility])     Resolved Hospital Problems  No resolved problems to display.    Discharge Condition: Improved, being discharged home  Diet recommendation: Heart healthy  Vitals:   11/21/20 0747 11/21/20 1046  BP: 132/70 (!) 117/52  Pulse: 95   Resp: 20 19  Temp: 99.1 F (37.3 C) 99.9 F (37.7 C)  SpO2:      History of present illness:  87 year old female with past medical history of atrial fibrillation, diabetes mellitus, hypertension, PE on Eliquis and glaucoma plus type 2 diabetes mellitus presented to the emergency room on 1/29 with complaints of nausea/vomiting plus dizziness and EMS EKG en route noted A-V dissociation with bradycardia at heart rate of 34.  EKG in emergency room noted junctional tachycardia.  Cardiology consulted and patient admitted to the hospitalist service.  Also found to have UTI  Hospital Course:  Principal Problem:   Tachy-brady syndrome [facility]): Patient seen by cardiology and had pacemaker placed on 1/31.  Despite pacemaker, looks like as persistent a flutter versus atrial fibrillation.  Metoprolol resumed.  Pacemaker looks to be in stable positioning.  Echocardiogram done 2/2 with results pending.  She will  resume her Eliquis, renally adjusted.  Cardiology will continue to follow as outpatient, at that time, they may consider restarting dig at a lower dose.  When cardiology sees patient in follow-up, cardioversion will be discussed. Active Problems:   Overweight (BMI 25.0-29.9): Meets criteria BMI greater than 25  E. coli UTI: Received Ancef prior to pacemaker placement followed by Rocephin.  Sensitivities no resistance to Cipro.  Completed 2 days of IV Rocephin and will send home with 2 more days of p.o. cefdinir    Hypertension: Patient resume her home medications    Diabetes mellitus ([place]): A1c at 7.6 indicating good control: Continue home meds. Decrease Neurontin to 100 twice daily for creatinine clearance  Osteoporosis: Continue Prolia    History of pulmonary embolism: Resume Eliquis, decreased for renal adjustment    CKD (chronic kidney disease), stage IV ([place]): Likely secondary to diabetes and hypertension.  Appears to be at her baseline.     Procedures:  Echo done 2/2: Results pending.  Pt will follow cardiology in office    Consultations:  Cardiology   Discharge Exam: BP (!) 117/52 (BP Location: Right Arm)   Pulse 95   Temp 99.9 F (37.7 C) (Axillary)   Resp 19   Ht 5\' 5"  (1.651 m)   Wt 77.4 kg   SpO2 98%   BMI 28.40 kg/m   General: Alert & oriented x 3, NAD  Cardiovascular: Irreg, borderline tachycardic (seen prior to metoprolol given)  Respiratory: Clear to auscultation bilaterally   Discharge Instructions You were cared for by a hospitalist during your hospital stay. If you  have any questions about your discharge medications or the care you received while you were in the hospital after you are discharged, you can call the unit and asked to speak with the hospitalist on call if the hospitalist that took care of you is not available. Once you are discharged, your primary care physician will handle any further medical issues. Please note that NO REFILLS for any  discharge medications will be authorized once you are discharged, as it is imperative that you return to your primary care physician (or establish a relationship with a primary care physician if you do not have one) for your aftercare needs so that they can reassess your need for medications and monitor your lab values.  Discharge Instructions    Diet - low sodium heart healthy   Complete by: As directed    Increase activity slowly   Complete by: As directed      Allergies as of 11/21/2020      Reactions   Metformin Other (See Comments)   Caused stomach upset (tolerates timed release)      Medication List    STOP taking these medications   aspirin EC 81 MG tablet     TAKE these medications   amLODipine 5 MG tablet Commonly known as: NORVASC Take 5 mg by mouth daily.   apixaban 2.5 MG Tabs tablet Commonly known as: ELIQUIS Take 1 tablet (2.5 mg total) by mouth 2 (two) times daily. What changed:   medication strength  how much to take   brimonidine 0.2 % ophthalmic solution Commonly known as: ALPHAGAN Place 1 drop into both eyes 3 (three) times daily.   CALCIUM 600-D PO Take 1 tablet by mouth 2 (two) times daily.   ciprofloxacin 250 MG tablet Commonly known as: CIPRO Take 1 tablet (250 mg total) by mouth once for 1 dose. Start taking on: November 22, 2020   denosumab 60 MG/ML Soln injection Commonly known as: PROLIA Inject 60 mg into the skin every 6 (six) months. Administer in upper arm, thigh, or abdomen   dorzolamide-timolol 22.3-6.8 MG/ML ophthalmic solution Commonly known as: COSOPT Place 1 drop into both eyes 2 (two) times daily.   esomeprazole 20 MG capsule Commonly known as: NEXIUM Take 20 mg by mouth daily at 12 noon.   gabapentin 300 MG capsule Commonly known as: NEURONTIN Take 300 mg by mouth 2 (two) times daily.   hydrochlorothiazide 25 MG tablet Commonly known as: HYDRODIURIL Take 25 mg by mouth daily.   insulin lispro 100 UNIT/ML  KwikPen Commonly known as: HUMALOG Inject 10 Units into the skin daily with lunch.   latanoprost 0.005 % ophthalmic solution Commonly known as: XALATAN Place 1 drop into both eyes at bedtime.   lisinopril 40 MG tablet Commonly known as: ZESTRIL Take 40 mg by mouth daily.   metFORMIN 500 MG 24 hr tablet Commonly known as: GLUCOPHAGE-XR Take 1,000 mg by mouth 2 (two) times daily.   metoprolol tartrate 100 MG tablet Commonly known as: LOPRESSOR Take 100 mg by mouth 2 (two) times daily.   montelukast 10 MG tablet Commonly known as: SINGULAIR Take 10 mg by mouth at bedtime.   rosuvastatin 40 MG tablet Commonly known as: CRESTOR Take 40 mg by mouth at bedtime.   Tyler Aas FlexTouch 100 UNIT/ML FlexTouch Pen Generic drug: insulin degludec Inject 10-25 Units into the skin at bedtime. Based on CBG   vitamin B-12 500 MCG tablet Commonly known as: CYANOCOBALAMIN Take 1,000 mcg by mouth daily.   VITAMIN  D3 PO Take 1 tablet by mouth 2 (two) times daily.      Allergies  Allergen Reactions  . Metformin Other (See Comments)    Caused stomach upset (tolerates timed release)    Follow-up Information    [place] [facility] Follow up.   Specialty: Cardiology Why: 12/04/20 @ 3:20PM, wound check visit Contact information: [address] [place]       Evans Lance, MD Follow up.   Specialty: Cardiology Why: 02/20/21 @ 3:30PM Contact information: [address] [place] [address] [phone]        Baldwin Jamaica, PA-C Follow up.   Specialty: Cardiology Why: 12/14/20 @ 11:45AM, for Dr. Caryl Comes, discuss cardiversion Contact information: [address] [place] [address] [phone]                The results of significant diagnostics from this hospitalization (including imaging, microbiology, ancillary and laboratory) are listed below for reference.     Significant Diagnostic Studies: DG Chest 2 View  Result Date: 11/20/2020 CLINICAL DATA:  Post pacer.  Sore arm. EXAM: CHEST - 2 VIEW COMPARISON:  No prior. FINDINGS: Cardiac pacer with lead tip over the right atrium right ventricle. Heart size normal. Low lung volumes. No focal infiltrate. No pleural effusion or pneumothorax. IMPRESSION: 1. Cardiac pacer with lead tip over the right atrium and right ventricle. No pneumothorax. 2. Low lung volumes. No acute cardiopulmonary disease. Electronically Signed   By: Marcello Moores  Register   On: 11/20/2020 07:33    Microbiology: Recent Results (from the past 240 hour(s))  SARS CORONAVIRUS 2 (TAT 6-24 HRS) Nasopharyngeal Nasopharyngeal Swab     Status: None   Collection Time: 11/17/20 10:04 PM   Specimen: Nasopharyngeal Swab  Result Value Ref Range Status   SARS Coronavirus 2 NEGATIVE NEGATIVE Final    Comment: (NOTE) SARS-CoV-2 target nucleic acids are NOT DETECTED.  The SARS-CoV-2 RNA is generally detectable in upper and lower respiratory specimens during the acute phase of infection. Negative results do not preclude SARS-CoV-2 infection, do not rule out co-infections with other pathogens, and should not be used as the sole basis for treatment or other patient management decisions. Negative results must be combined with clinical observations, patient history, and epidemiological information. The expected result is Negative.  Fact Sheet for Patients: SugarRoll.be  Fact Sheet for Healthcare Providers: https://www.woods-mathews.com/  This test is not yet approved or cleared by the [place] FDA and  has been authorized for detection and/or diagnosis of SARS-CoV-2 by FDA under an Emergency Use Authorization (EUA). This EUA will remain  in effect (meaning this test can be used) for the duration of the COVID-19 declaration under Se ction 564(b)(1) of the Act, 21 U.S.C. section 360bbb-3(b)(1), unless the  authorization is terminated or revoked sooner.  Performed at [facility], [place] [address], [place], [place] [address]   Surgical PCR screen     Status: None   Collection Time: 11/19/20 11:20 AM   Specimen: Nasal Mucosa; Nasal Swab  Result Value Ref Range Status   MRSA, PCR NEGATIVE NEGATIVE Final   Staphylococcus aureus NEGATIVE NEGATIVE Final    Comment: (NOTE) The Xpert SA Assay (FDA approved for NASAL specimens in patients 84 years of age and older), is one component of a comprehensive surveillance program. It is not intended to diagnose infection nor to guide or monitor treatment. Performed at [facility],  [address], [place], [place] [address]   Culture, blood (routine x 2)     Status: None (Preliminary result)   Collection Time: 11/19/20 11:32 AM   Specimen: BLOOD LEFT HAND  Result Value Ref Range Status   Specimen Description BLOOD LEFT HAND  Final   Special Requests   Final    BOTTLES DRAWN AEROBIC ONLY Blood Culture adequate volume   Culture   Final    NO GROWTH 2 DAYS Performed at [facility], [place] [address] [place], [place] [address]    Report Status PENDING  Incomplete  Culture, blood (routine x 2)     Status: None (Preliminary result)   Collection Time: 11/19/20 11:33 AM   Specimen: BLOOD  Result Value Ref Range Status   Specimen Description BLOOD RIGHT ANTECUBITAL  Final   Special Requests   Final    BOTTLES DRAWN AEROBIC AND ANAEROBIC Blood Culture results may not be optimal due to an inadequate volume of blood received in culture bottles   Culture   Final    NO GROWTH 2 DAYS Performed at [facility], [place] [address] [place], [place] [address]    Report Status PENDING  Incomplete  Culture, Urine     Status: Abnormal   Collection Time: 11/19/20  2:10 PM   Specimen: Urine, Random  Result Value Ref Range Status   Specimen Description URINE, RANDOM  Final   Special Requests   Final    NONE Performed at [place], [place] [address], [place], [place] [address]    Culture >=100,000 COLONIES/mL ESCHERICHIA COLI (A)  Final   Report Status 11/21/2020 FINAL  Final   Organism ID, Bacteria ESCHERICHIA COLI (A)  Final      Susceptibility   Escherichia coli - MIC*    AMPICILLIN >=32 RESISTANT Resistant     CEFAZOLIN 8 SENSITIVE Sensitive     CEFEPIME <=0.12 SENSITIVE Sensitive     CEFTRIAXONE <=0.25 SENSITIVE Sensitive     CIPROFLOXACIN >=4 RESISTANT Resistant     GENTAMICIN <=1 SENSITIVE Sensitive     IMIPENEM <=0.25 SENSITIVE Sensitive     NITROFURANTOIN <=16 SENSITIVE Sensitive     TRIMETH/SULFA <=20 SENSITIVE Sensitive     AMPICILLIN/SULBACTAM >=32 RESISTANT Resistant     PIP/TAZO 64 INTERMEDIATE Intermediate     * >=100,000 COLONIES/mL ESCHERICHIA COLI     Labs: Basic Metabolic Panel: Recent Labs  Lab 11/17/20 1827 11/18/20 0050 11/18/20 0249 11/18/20 0800 11/19/20 0222 11/20/20 0634 11/21/20 0200  NA 138  --  137  --  135 136 135  K 5.0  --  3.8  --  4.1 4.6 4.2  CL 104  --  101  --  103 103 104  CO2 22  --  24  --  21* 19* 21*  GLUCOSE 192*  --  186*  --  310* 243* 202*  BUN 30*  --  30*  --  27* 32* 41*  CREATININE 2.04* 1.83* 1.77*  --  1.57* 1.92* 1.94*  CALCIUM 10.0  --  9.5  --  9.4 9.4 8.7*  MG  --   --   --  2.1  --   --   --    Liver Function Tests: Recent Labs  Lab 11/18/20 0249  AST 22  ALT 15  ALKPHOS 28*  BILITOT 0.4  PROT 5.9*  ALBUMIN 3.3*   No results for input(s): LIPASE, AMYLASE in the last 168 hours. No results  for input(s): AMMONIA in the last 168 hours. CBC: Recent Labs  Lab 11/18/20 0050 11/18/20 0249 11/19/20 0222 11/20/20 0732 11/21/20 0200  WBC 8.8 8.1 11.1* 11.7* 9.8  HGB 10.4* 9.8* 11.3* 12.9 11.0*  HCT 33.4* 30.0* 33.7* 39.5 34.1*  MCV 87.7 86.0 84.5 85.7 85.0  PLT 196 180 193 202 143*   Cardiac Enzymes: No results for input(s): CKTOTAL, CKMB, CKMBINDEX, TROPONINI in the last 168 hours. BNP: BNP (last 3 results) No results for  input(s): BNP in the last 8760 hours.  ProBNP (last 3 results) No results for input(s): PROBNP in the last 8760 hours.  CBG: Recent Labs  Lab 11/20/20 1134 11/20/20 1638 11/20/20 2142 11/21/20 0745 11/21/20 1043  GLUCAP 454* 337* 353* 183* 438*       Signed:  Annita Brod, MD [facility] 11/21/2020, 11:23 AM

## 2020-11-24 LAB — CULTURE, BLOOD (ROUTINE X 2)
Culture: NO GROWTH
Culture: NO GROWTH
Special Requests: ADEQUATE

## 2020-11-26 ENCOUNTER — Telehealth: Payer: Self-pay

## 2020-11-26 NOTE — Telephone Encounter (Signed)
Called and spoke with patient's daughter, Lattie Haw, she is aware of appt 11/27/20 at 3:30 pm with Adline Peals, PA.

## 2020-11-26 NOTE — Telephone Encounter (Signed)
Spoke to Dr. Lovena Le about patients concerns. Would like for patient to be referred to AF clinic for evaluation to have rates better controlled. Called and advised Lattie Haw per request. Advised to call with any further questions or concerns.

## 2020-11-26 NOTE — Telephone Encounter (Signed)
Returned patients phone call. PPM was implanted on 11/19/20 by Dr. Caryl Comes. Reports of increased fatigue and no appetite.  Denies any chest pain, shortness of breath or palpitations. Patient reports she has not felt any improvement since she left the hospital. Has known hx of AF w/ RVR, compliant with [place].  Attempted to send manual transmission. Unable to send d/t [employer] home monitoring not currently working. Unknown how long the issue may last.   Offered for patient to come into the device clinic to have her pacemaker interrogated. States she lives in [place] and she would have to have her daughter bring her. States she will call her daughter and call back. Direct phone number provided. ED precautions provided.

## 2020-11-26 NOTE — Telephone Encounter (Signed)
Patient daughter called in concerned that her mom has not been feeling well since the implant. She has some concerns about the recovery time. Patient would like a nurse to call her back

## 2020-11-27 ENCOUNTER — Other Ambulatory Visit: Payer: Self-pay

## 2020-11-27 ENCOUNTER — Encounter (HOSPITAL_COMMUNITY): Payer: Self-pay | Admitting: Physician Assistant

## 2020-11-27 ENCOUNTER — Ambulatory Visit (HOSPITAL_COMMUNITY)
Admission: RE | Admit: 2020-11-27 | Discharge: 2020-11-27 | Disposition: A | Discharge status: Home / Self Care | Payer: Medicare Other | Source: Ambulatory Visit | Attending: Physician Assistant | Admitting: Physician Assistant

## 2020-11-27 VITALS — BP 114/72 | HR 113 | Ht 65.0 in | Wt 163.8 lb

## 2020-11-27 DIAGNOSIS — Z950 Presence of cardiac pacemaker: Secondary | ICD-10-CM | POA: Diagnosis not present

## 2020-11-27 DIAGNOSIS — D6869 Other thrombophilia: Secondary | ICD-10-CM | POA: Diagnosis not present

## 2020-11-27 DIAGNOSIS — Z7901 Long term (current) use of anticoagulants: Secondary | ICD-10-CM | POA: Diagnosis not present

## 2020-11-27 DIAGNOSIS — E785 Hyperlipidemia, unspecified: Secondary | ICD-10-CM | POA: Insufficient documentation

## 2020-11-27 DIAGNOSIS — I10 Essential (primary) hypertension: Secondary | ICD-10-CM | POA: Diagnosis not present

## 2020-11-27 DIAGNOSIS — Z86711 Personal history of pulmonary embolism: Secondary | ICD-10-CM | POA: Insufficient documentation

## 2020-11-27 DIAGNOSIS — I4819 Other persistent atrial fibrillation: Secondary | ICD-10-CM | POA: Diagnosis present

## 2020-11-27 DIAGNOSIS — I495 Sick sinus syndrome: Secondary | ICD-10-CM | POA: Insufficient documentation

## 2020-11-27 DIAGNOSIS — I4892 Unspecified atrial flutter: Secondary | ICD-10-CM | POA: Diagnosis not present

## 2020-11-27 MED ORDER — DILTIAZEM HCL ER COATED BEADS 120 MG PO CP24: 120.0000 mg | ORAL_CAPSULE | Freq: Every day | ORAL | 3 refills | Status: DC

## 2020-11-27 NOTE — Patient Instructions (Signed)
Stop amlodipine (norvasc)  Tomorrow morning Start Cardizem (Diltiazem) 120mg  once a day

## 2020-11-27 NOTE — Progress Notes (Signed)
Primary Care Physician: Josem Kaufmann, MD Primary Electrophysiologist: Dr Lovena Le Referring Physician: Dr Verdie Shire is a 87 y.o. female with a history of atrial fibrillation, atrial flutter, diabetes, hyperlipidemia, hypertension, PE, GERD, glaucoma, CKD, and tachybradycardia syndrome s/p PPM who presents for follow up in the [facility]. Patient is on Eliquis for a CHADS2VASC score of 5. Patient presented to the hospital 11/17/20 after an episode of acute dizziness, nausea, and near syncope.  She was in her kitchen about 1230 preparing lunch.  Her daughter was called to check on her.  Her blood sugar was checked and was in the mid 70s.  Her pulse was checked and was was in the 30s and thus EMS was called.  She was transported to the emergency room.  She had previously been on metoprolol after a diagnosis of atrial fibrillation in 2020 when she was hospitalized with pneumonia. She did convert to atrial flutter while in the emergency room. She had a PPM implanted for tachybradycardia syndrome with plan to undergo DCCV as an outpatient.   On follow up today, the device clinic received an alert for elevated heart rates. Patient reports she remains fatigued and SOB with exertion. She denies any weight gain, PND, or orthopnea. She denies any missed doses of anticoagulation since leaving the hospital.   Today, she denies symptoms of palpitations, chest pain, orthopnea, PND, lower extremity edema, dizziness, presyncope, syncope, snoring, daytime somnolence, bleeding, or neurologic sequela. The patient is tolerating medications without difficulties and is otherwise without complaint today.    Atrial Fibrillation Risk Factors:  she does not have symptoms or diagnosis of sleep apnea.. she does not have a history of rheumatic fever.   she has a BMI of Body mass index is 27.26 kg/m.Marland Kitchen Filed Weights   11/27/20 1531  Weight: 74.3 kg    Family History  Problem  Relation Age of Onset  . Diabetes Mother   . CVA Mother   . Leukemia Father   . Colon cancer Neg Hx   . Stomach cancer Neg Hx   . Pancreatic cancer Neg Hx   . Esophageal cancer Neg Hx      Atrial Fibrillation Management history:  Previous antiarrhythmic drugs: none Previous cardioversions: none Previous ablations: none CHADS2VASC score: 5 Anticoagulation history: Eliquis   Past Medical History:  Diagnosis Date  . Asthma due to environmental allergies   . Basal cell carcinoma 05/08/2020   bcc ant. mid neck   . Breast cancer ([place])    2003  . Cataract   . Chronic kidney disease   . Clotting disorder ([place])   . Diabetes mellitus ([place])    type II  . Diabetic coma ([place])   . GERD (gastroesophageal reflux disease)   . Glaucoma   . Hyperlipidemia   . Hypertension   . Osteoporosis   . Pneumonia   . Pulmonary embolism ([place])   . Squamous cell carcinoma of skin 10/18/2014   well diff-right upper arm  . Squamous cell carcinoma of skin 06/02/2019   in situ-upper lip (txpbx)  . Tubular adenoma of colon 2017   Past Surgical History:  Procedure Laterality Date  . APPENDECTOMY    . CATARACT EXTRACTION, BILATERAL    . MASTECTOMY     left w/ymph node removal  . PACEMAKER IMPLANT N/A 11/19/2020   Procedure: PACEMAKER IMPLANT;  Surgeon: Deboraha Sprang, MD;  Location: [facility];  Service: Cardiovascular;  Laterality: N/A;  .  SKIN CANCER EXCISION    . TONSILLECTOMY    . VAGINAL HYSTERECTOMY  1981    Current Outpatient Medications  Medication Sig Dispense Refill  . apixaban (ELIQUIS) 2.5 MG TABS tablet Take 1 tablet (2.5 mg total) by mouth 2 (two) times daily. 60 tablet 0  . brimonidine (ALPHAGAN) 0.2 % ophthalmic solution Place 1 drop into both eyes 3 (three) times daily.    . Calcium Carb-Cholecalciferol (CALCIUM 600-D PO) Take 1 tablet by mouth 2 (two) times daily.    . Cholecalciferol (VITAMIN D3 PO) Take 1 tablet by mouth 2 (two) times daily.    Marland Kitchen denosumab  (PROLIA) 60 MG/ML SOLN injection Inject 60 mg into the skin every 6 (six) months. Administer in upper arm, thigh, or abdomen    . diltiazem (CARDIZEM CD) 120 MG 24 hr capsule Take 1 capsule (120 mg total) by mouth daily. 30 capsule 3  . dorzolamide-timolol (COSOPT) 22.3-6.8 MG/ML ophthalmic solution Place 1 drop into both eyes 2 (two) times daily.    Marland Kitchen esomeprazole (NEXIUM) 20 MG capsule Take 20 mg by mouth daily at 12 noon.    . gabapentin (NEURONTIN) 100 MG capsule Take 1 capsule (100 mg total) by mouth 2 (two) times daily. 60 capsule 1  . hydrochlorothiazide (HYDRODIURIL) 25 MG tablet Take 25 mg by mouth daily.    . insulin degludec (TRESIBA FLEXTOUCH) 100 UNIT/ML FlexTouch Pen Inject 10-25 Units into the skin at bedtime. Based on CBG    . insulin lispro (HUMALOG) 100 UNIT/ML KwikPen Inject 10 Units into the skin daily with lunch.    . latanoprost (XALATAN) 0.005 % ophthalmic solution Place 1 drop into both eyes at bedtime.    Marland Kitchen lisinopril (ZESTRIL) 40 MG tablet Take 40 mg by mouth daily.    . metFORMIN (GLUCOPHAGE-XR) 500 MG 24 hr tablet Take 1,000 mg by mouth 2 (two) times daily.    . metoprolol tartrate (LOPRESSOR) 100 MG tablet Take 100 mg by mouth 2 (two) times daily.    . montelukast (SINGULAIR) 10 MG tablet Take 10 mg by mouth at bedtime.    . rosuvastatin (CRESTOR) 40 MG tablet Take 40 mg by mouth at bedtime.    . vitamin B-12 (CYANOCOBALAMIN) 500 MCG tablet Take 1,000 mcg by mouth daily.     No current facility-administered medications for this encounter.    Allergies  Allergen Reactions  . Metformin Other (See Comments)    Caused stomach upset (tolerates timed release)    Social History   Socioeconomic History  . Marital status: Single    Spouse name: Not on file  . Number of children: Not on file  . Years of education: Not on file  . Highest education level: Not on file  Occupational History  . Not on file  Tobacco Use  . Smoking status: Never Smoker  . Smokeless  tobacco: Never Used  Vaping Use  . Vaping Use: Never used  Substance and Sexual Activity  . Alcohol use: No  . Drug use: No  . Sexual activity: Not on file  Other Topics Concern  . Not on file  Social History Narrative  . Not on file   Social Determinants of Health   Financial Resource Strain: Not on file  Food Insecurity: Not on file  Transportation Needs: Not on file  Physical Activity: Not on file  Stress: Not on file  Social Connections: Not on file  Intimate Partner Violence: Not on file     ROS- All systems are  reviewed and negative except as per the HPI above.  Physical Exam: Vitals:   11/27/20 1531  BP: 114/72  Pulse: (!) 113  Weight: 74.3 kg  Height: 5\' 5"  (1.651 m)    GEN- The patient is well appearing elderly female, alert and oriented x 3 today.   Head- normocephalic, atraumatic Eyes-  Sclera clear, conjunctiva pink Ears- hearing intact Oropharynx- clear Neck- supple  Lungs- Clear to ausculation bilaterally, normal work of breathing Heart- irregular rate and rhythm, tachycardia, no murmurs, rubs or gallops  GI- soft, NT, ND, + BS Extremities- no clubbing, cyanosis, or edema MS- no significant deformity or atrophy Skin- no rash or lesion Psych- euthymic mood, full affect Neuro- strength and sensation are intact  Wt Readings from Last 3 Encounters:  11/27/20 74.3 kg  11/21/20 77.4 kg  08/18/19 75.8 kg    EKG today demonstrates  Afib, NST Vent. rate 113 BPM QRS duration 82 ms QT/QTc 318/436 ms  Echo 11/21/20 demonstrated  1. Left ventricular ejection fraction, by estimation, is 60 to 65%. The  left ventricle has normal function. The left ventricle has no regional  wall motion abnormalities. There is mild left ventricular hypertrophy.  Left ventricular diastolic parameters  are indeterminate.  2. Right ventricular systolic function is mildly reduced. The right  ventricular size is normal. There is mildly elevated pulmonary artery  systolic  pressure. The estimated right ventricular systolic pressure is  94.7 mmHg.  3. The mitral valve is normal in structure. Trivial mitral valve  regurgitation.  4. The aortic valve is tricuspid. Aortic valve regurgitation is not  visualized. Mild aortic valve sclerosis is present, with no evidence of  aortic valve stenosis.  5. The inferior vena cava is normal in size with greater than 50%  respiratory variability, suggesting right atrial pressure of 3 mmHg.   Epic records are reviewed at length today  CHA2DS2-VASc Score = 5  The patient's score is based upon: CHF History: No HTN History: Yes Diabetes History: Yes Stroke History: No Vascular Disease History: No Age Score: 2 Gender Score: 1      ASSESSMENT AND PLAN: 1. Persistent Atrial Fibrillation/atrial flutter The patient's CHA2DS2-VASc score is 5, indicating a 7.2% annual risk of stroke.   Patient in rapid afib today.  We discussed therapeutic options today. Will stop amlodipine and start diltiazem 120 mg daily for rate control. Plan at discharge is for DCCV after 3 weeks of uninterrupted anticoagulation. However, if she has clinical deterioration, can consider TEE guided DCCV. Continue Eliquis 2.5 mg BID Continue Lopressor 100 mg BID  2. Secondary Hypercoagulable State (ICD10:  D68.69) The patient is at significant risk for stroke/thromboembolism based upon her CHA2DS2-VASc Score of 5.  Continue Apixaban (Eliquis).   3. Tachybradycardia syndrome S/p PPM, followed by Dr Lovena Le and the device clinic.  4. HTN Stable, med changes as above.    Follow up with Tommye Standard as scheduled.    [facility] [address] [place], [place] [address] [phone] 11/27/2020 4:17 PM

## 2020-12-04 ENCOUNTER — Ambulatory Visit (INDEPENDENT_AMBULATORY_CARE_PROVIDER_SITE_OTHER): Payer: Medicare Other | Admitting: Emergency Medicine

## 2020-12-04 ENCOUNTER — Other Ambulatory Visit: Payer: Self-pay

## 2020-12-04 DIAGNOSIS — I442 Atrioventricular block, complete: Secondary | ICD-10-CM

## 2020-12-04 LAB — CUP PACEART INCLINIC DEVICE CHECK
Battery Remaining Longevity: 167 mo
Battery Voltage: 3.22 V
Brady Statistic AP VP Percent: 0.04 %
Brady Statistic AP VS Percent: 81.97 %
Brady Statistic AS VP Percent: 0.01 %
Brady Statistic AS VS Percent: 18.14 %
Brady Statistic RA Percent Paced: 39.89 %
Brady Statistic RV Percent Paced: 4.34 %
Date Time Interrogation Session: 20220215155928
Implantable Lead Implant Date: 20220131
Implantable Lead Implant Date: 20220131
Implantable Lead Location: 753859
Implantable Lead Location: 753860
Implantable Lead Model: 5076
Implantable Lead Model: 5076
Implantable Pulse Generator Implant Date: 20220131
Lead Channel Impedance Value: 361 Ohm
Lead Channel Impedance Value: 399 Ohm
Lead Channel Impedance Value: 399 Ohm
Lead Channel Impedance Value: 494 Ohm
Lead Channel Pacing Threshold Amplitude: 0.5 V
Lead Channel Pacing Threshold Amplitude: 0.75 V
Lead Channel Pacing Threshold Pulse Width: 0.4 ms
Lead Channel Pacing Threshold Pulse Width: 0.4 ms
Lead Channel Sensing Intrinsic Amplitude: 0.75 mV
Lead Channel Sensing Intrinsic Amplitude: 1 mV
Lead Channel Sensing Intrinsic Amplitude: 4.75 mV
Lead Channel Sensing Intrinsic Amplitude: 5 mV
Lead Channel Setting Pacing Amplitude: 3.5 V
Lead Channel Setting Pacing Amplitude: 3.5 V
Lead Channel Setting Pacing Pulse Width: 0.4 ms
Lead Channel Setting Sensing Sensitivity: 0.9 mV

## 2020-12-04 NOTE — Progress Notes (Signed)
Wound check appointment. Dermabond removed. Wound without redness or edema. Incision edges approximated, wound well healed. Normal device function. Thresholds, sensing, and impedances consistent with implant measurements. Device programmed at 3.5V for extra safety margin until 3 month visit. Histogram distribution appropriate for patient and level of activity. 20 AF/ Fast A&V episodes, most recent episode AF on 11/28/20, lasting 3 hours with average A/V rate of 269/114.  Pt seen in AF clinic on 2/8 with changes in meds.  AF Burden 51.7%, +OAC.  Patient educated about wound care, arm mobility, lifting restrictions. Pt enrolled in remote monitoring, next scheduled check 02/19/21, ROV scheduled with RU on 12/14/20, GT on 02/20/21.

## 2020-12-09 NOTE — Progress Notes (Signed)
Cardiology Office Note Date:  12/09/2020  Patient ID:  Kristen, Fox [birth year], MRN [id number] PCP:  Josem Kaufmann, MD  Cardiologist/Electrophysiologist: Dr. Caryl Comes    Chief Complaint:  plan DCCV  History of Present Illness: Kristen Fox is a 87 y.o. female with history of atrial fibrillation, glaucoma,  DM, HTN, HLD, PE, GERD, glaucoma, CKD .  Hospitalized [facility] 11/18/20 with near syncope, found in AFib HR 30's > in the ER spontaneous rhythm change to AFlutter, home BB stopped >> RVR and underwent PPM for tachy-brady. She had AKI on CKD her eliquis dose adjusted, home meds adjusted as well D/c 11/21/20  She saw R. Fenton in the Afib clinic 11/27/20 for elevated HRs noted vis device alert Amlodipine stopped and started on diltiazem  Planned for DCCV at time of hospital d/c once uninterrupted a/c.  TODAY She is accompanied by her daughter. She is doing well. Tolerating her medicines. Healing well. No CP, palpitations or cardic awareness. No dizzy spells, near syncope or syncope. No SOB, DOE Was out today with her daughter shopping earlier, feels well.  No bleeding or signs of bleeding.   Device information MDT dual chamber PPM implanted 11/19/20  Afib hx Initial diagnosis in 2020 during pneumonia illness Recurrent Jan 2022   Past Medical History:  Diagnosis Date  . Asthma due to environmental allergies   . Basal cell carcinoma 05/08/2020   bcc ant. mid neck   . Breast cancer ([place])    2003  . Cataract   . Chronic kidney disease   . Clotting disorder ([place])   . Diabetes mellitus ([place])    type II  . Diabetic coma ([place])   . GERD (gastroesophageal reflux disease)   . Glaucoma   . Hyperlipidemia   . Hypertension   . Osteoporosis   . Pneumonia   . Pulmonary embolism ([place])   . Squamous cell carcinoma of skin 10/18/2014   well diff-right upper arm  . Squamous cell carcinoma of skin 06/02/2019   in situ-upper lip (txpbx)  . Tubular adenoma of colon 2017     Past Surgical History:  Procedure Laterality Date  . APPENDECTOMY    . CATARACT EXTRACTION, BILATERAL    . MASTECTOMY     left w/ymph node removal  . PACEMAKER IMPLANT N/A 11/19/2020   Procedure: PACEMAKER IMPLANT;  Surgeon: Deboraha Sprang, MD;  Location: [facility];  Service: Cardiovascular;  Laterality: N/A;  . SKIN CANCER EXCISION    . TONSILLECTOMY    . VAGINAL HYSTERECTOMY  1981    Current Outpatient Medications  Medication Sig Dispense Refill  . apixaban (ELIQUIS) 2.5 MG TABS tablet Take 1 tablet (2.5 mg total) by mouth 2 (two) times daily. 60 tablet 0  . brimonidine (ALPHAGAN) 0.2 % ophthalmic solution Place 1 drop into both eyes 3 (three) times daily.    . Calcium Carb-Cholecalciferol (CALCIUM 600-D PO) Take 1 tablet by mouth 2 (two) times daily.    . Cholecalciferol (VITAMIN D3 PO) Take 1 tablet by mouth 2 (two) times daily.    Marland Kitchen denosumab (PROLIA) 60 MG/ML SOLN injection Inject 60 mg into the skin every 6 (six) months. Administer in upper arm, thigh, or abdomen    . diltiazem (CARDIZEM CD) 120 MG 24 hr capsule Take 1 capsule (120 mg total) by mouth daily. 30 capsule 3  . dorzolamide-timolol (COSOPT) 22.3-6.8 MG/ML ophthalmic solution Place 1 drop into both eyes 2 (two) times daily.    Marland Kitchen esomeprazole (NEXIUM) 20 MG  capsule Take 20 mg by mouth daily at 12 noon.    . gabapentin (NEURONTIN) 100 MG capsule Take 1 capsule (100 mg total) by mouth 2 (two) times daily. 60 capsule 1  . hydrochlorothiazide (HYDRODIURIL) 25 MG tablet Take 25 mg by mouth daily.    . insulin degludec (TRESIBA FLEXTOUCH) 100 UNIT/ML FlexTouch Pen Inject 10-25 Units into the skin at bedtime. Based on CBG    . insulin lispro (HUMALOG) 100 UNIT/ML KwikPen Inject 10 Units into the skin daily with lunch.    . latanoprost (XALATAN) 0.005 % ophthalmic solution Place 1 drop into both eyes at bedtime.    Marland Kitchen lisinopril (ZESTRIL) 40 MG tablet Take 40 mg by mouth daily.    . metFORMIN (GLUCOPHAGE-XR) 500 MG  24 hr tablet Take 1,000 mg by mouth 2 (two) times daily.    . metoprolol tartrate (LOPRESSOR) 100 MG tablet Take 100 mg by mouth 2 (two) times daily.    . montelukast (SINGULAIR) 10 MG tablet Take 10 mg by mouth at bedtime.    . rosuvastatin (CRESTOR) 40 MG tablet Take 40 mg by mouth at bedtime.    . vitamin B-12 (CYANOCOBALAMIN) 500 MCG tablet Take 1,000 mcg by mouth daily.     No current facility-administered medications for this visit.    Allergies:   Metformin   Social History:  The patient  reports that she has never smoked. She has never used smokeless tobacco. She reports that she does not drink alcohol and does not use drugs.   Family History:  The patient's family history includes CVA in her mother; Diabetes in her mother; Leukemia in her father.  ROS:  Please see the history of present illness.    All other systems are reviewed and otherwise negative.   PHYSICAL EXAM:  VS:  There were no vitals taken for this visit. BMI: There is no height or weight on file to calculate BMI. Well nourished, well developed, in no acute distress HEENT: normocephalic, atraumatic Neck: no JVD, carotid bruits or masses Cardiac:  RRR; no significant murmurs, no rubs, or gallops Lungs:  CTA b/l, no wheezing, rhonchi or rales Abd: soft, nontender MS: no deformity or atrophy Ext: trace edema Skin: warm and dry, no rash Neuro:  No gross deficits appreciated Psych: euthymic mood, full affect  PPM site is stable,, wound edges are well approximated, no erythema, edema, fluctuation, or tenderness. No signs of infection, healing well. no tethering or discomfort   EKG:  Done today and reviewed by myself shows  AP/VS  Device interrogation done today and reviewed by myself:  Battery and lead measurements are good Acute implant outputs remain   11/21/20: TTE IMPRESSIONS  1. Left ventricular ejection fraction, by estimation, is 60 to 65%. The  left ventricle has normal function. The left ventricle  has no regional  wall motion abnormalities. There is mild left ventricular hypertrophy.  Left ventricular diastolic parameters  are indeterminate.  2. Right ventricular systolic function is mildly reduced. The right  ventricular size is normal. There is mildly elevated pulmonary artery  systolic pressure. The estimated right ventricular systolic pressure is  34.2 mmHg.  3. The mitral valve is normal in structure. Trivial mitral valve  regurgitation.  4. The aortic valve is tricuspid. Aortic valve regurgitation is not  visualized. Mild aortic valve sclerosis is present, with no evidence of  aortic valve stenosis.  5. The inferior vena cava is normal in size with greater than 50%  respiratory variability, suggesting right  atrial pressure of 3 mmHg.    Recent Labs: 11/18/2020: ALT 15; Magnesium 2.1; TSH 2.401 11/21/2020: BUN 41; Creatinine, Ser 1.94; Hemoglobin 11.0; Platelets 143; Potassium 4.2; Sodium 135  No results found for requested labs within last 8760 hours.   Estimated Creatinine Clearance: 21.8 mL/min (A) (by C-G formula based on SCr of 1.94 mg/dL (H)).   Wt Readings from Last 3 Encounters:  11/27/20 163 lb 12.8 oz (74.3 kg)  11/21/20 170 lb 10.2 oz (77.4 kg)  08/18/19 167 lb (75.8 kg)     Other studies reviewed: Additional studies/records reviewed today include: summarized above  ASSESSMENT AND PLAN:  1. PPM     Healing well     Intact function, no programming changes made  2. Paroxysmal  AFib     CHA2DS2Vasc is 5, on eliquis, appropriately dosed     A paced today     BMET   3. HTN     A little high, they will start to monitor at home  Disposition: F/u with Dr Lovena Le in [place] as planned.  Current medicines are reviewed at length with the patient today.  The patient did not have any concerns regarding medicines.  Venetia Night, PA-C 12/09/2020 11:50 AM     [facility] [address] [place] [address] [phone] (office)  [phone] (fax)

## 2020-12-11 ENCOUNTER — Ambulatory Visit: Payer: Medicare Other | Admitting: Physician Assistant

## 2020-12-14 ENCOUNTER — Ambulatory Visit (INDEPENDENT_AMBULATORY_CARE_PROVIDER_SITE_OTHER): Payer: Medicare Other | Admitting: Physician Assistant

## 2020-12-14 ENCOUNTER — Encounter: Payer: Self-pay | Admitting: Physician Assistant

## 2020-12-14 ENCOUNTER — Other Ambulatory Visit: Payer: Self-pay

## 2020-12-14 VITALS — BP 158/54 | HR 60 | Ht 65.0 in | Wt 166.0 lb

## 2020-12-14 DIAGNOSIS — Z79899 Other long term (current) drug therapy: Secondary | ICD-10-CM | POA: Diagnosis not present

## 2020-12-14 DIAGNOSIS — I10 Essential (primary) hypertension: Secondary | ICD-10-CM | POA: Diagnosis not present

## 2020-12-14 DIAGNOSIS — I480 Paroxysmal atrial fibrillation: Secondary | ICD-10-CM

## 2020-12-14 DIAGNOSIS — Z950 Presence of cardiac pacemaker: Secondary | ICD-10-CM | POA: Diagnosis not present

## 2020-12-14 LAB — CUP PACEART INCLINIC DEVICE CHECK
Battery Remaining Longevity: 163 mo
Battery Voltage: 3.22 V
Brady Statistic AP VP Percent: 0.04 %
Brady Statistic AP VS Percent: 68.56 %
Brady Statistic AS VP Percent: 0.03 %
Brady Statistic AS VS Percent: 31.38 %
Brady Statistic RA Percent Paced: 68.72 %
Brady Statistic RV Percent Paced: 0.06 %
Date Time Interrogation Session: 20220225125712
Implantable Lead Implant Date: 20220131
Implantable Lead Implant Date: 20220131
Implantable Lead Location: 753859
Implantable Lead Location: 753860
Implantable Lead Model: 5076
Implantable Lead Model: 5076
Implantable Pulse Generator Implant Date: 20220131
Lead Channel Impedance Value: 361 Ohm
Lead Channel Impedance Value: 399 Ohm
Lead Channel Impedance Value: 399 Ohm
Lead Channel Impedance Value: 513 Ohm
Lead Channel Pacing Threshold Amplitude: 0.5 V
Lead Channel Pacing Threshold Amplitude: 0.75 V
Lead Channel Pacing Threshold Pulse Width: 0.4 ms
Lead Channel Pacing Threshold Pulse Width: 0.4 ms
Lead Channel Sensing Intrinsic Amplitude: 1.375 mV
Lead Channel Sensing Intrinsic Amplitude: 2 mV
Lead Channel Sensing Intrinsic Amplitude: 6 mV
Lead Channel Sensing Intrinsic Amplitude: 6.25 mV
Lead Channel Setting Pacing Amplitude: 3.5 V
Lead Channel Setting Pacing Amplitude: 3.5 V
Lead Channel Setting Pacing Pulse Width: 0.4 ms
Lead Channel Setting Sensing Sensitivity: 0.9 mV

## 2020-12-14 NOTE — Patient Instructions (Addendum)
Medication Instructions:    Your physician recommends that you continue on your current medications as directed. Please refer to the Current Medication list given to you today.  *If you need a refill on your cardiac medications before your next appointment, please call your pharmacy*   Lab Work:  BMET TODAY    If you have labs (blood work) drawn today and your tests are completely normal, you will receive your results only by: Marland Kitchen MyChart Message (if you have MyChart) OR . A paper copy in the mail If you have any lab test that is abnormal or we need to change your treatment, we will call you to review the results.   Testing/Procedures: NONE ORDERED  TODAY   Follow-Up: At [facility], you and your health needs are our priority.  As part of our continuing mission to provide you with exceptional heart care, we have created designated Provider Care Teams.  These Care Teams include your primary Cardiologist (physician) and Advanced Practice Providers (APPs -  Physician Assistants and Nurse Practitioners) who all work together to provide you with the care you need, when you need it.  We recommend signing up for the patient portal called "MyChart".  Sign up information is provided on this After Visit Summary.  MyChart is used to connect with patients for Virtual Visits (Telemedicine).  Patients are able to view lab/test results, encounter notes, upcoming appointments, etc.  Non-urgent messages can be sent to your provider as well.   To learn more about what you can do with MyChart, go to [email or web address].    Your next appointment:   APPOINTMENT  CHANGED TO  NOT [place] IF POSSIBLE    The format for your next appointment:   In Person  Provider:   Cristopher Peru, MD   Other Instructions

## 2020-12-15 LAB — BASIC METABOLIC PANEL
BUN/Creatinine Ratio: 17 (ref 12–28)
BUN: 24 mg/dL (ref 8–27)
CO2: 22 mmol/L (ref 20–29)
Calcium: 10.2 mg/dL (ref 8.7–10.3)
Chloride: 100 mmol/L (ref 96–106)
Creatinine, Ser: 1.42 mg/dL — ABNORMAL HIGH (ref 0.57–1.00)
GFR calc Af Amer: 39 mL/min/{1.73_m2} — ABNORMAL LOW (ref >59)
GFR calc non Af Amer: 34 mL/min/{1.73_m2} — ABNORMAL LOW (ref >59)
Glucose: 209 mg/dL — ABNORMAL HIGH (ref 65–99)
Potassium: 5.1 mmol/L (ref 3.5–5.2)
Sodium: 140 mmol/L (ref 134–144)

## 2021-02-06 ENCOUNTER — Encounter: Payer: Self-pay | Admitting: Physician Assistant

## 2021-02-06 ENCOUNTER — Ambulatory Visit (INDEPENDENT_AMBULATORY_CARE_PROVIDER_SITE_OTHER): Payer: Medicare Other | Admitting: Physician Assistant

## 2021-02-06 ENCOUNTER — Other Ambulatory Visit: Payer: Self-pay

## 2021-02-06 DIAGNOSIS — Z794 Long term (current) use of insulin: Secondary | ICD-10-CM | POA: Diagnosis not present

## 2021-02-06 DIAGNOSIS — B358 Other dermatophytoses: Secondary | ICD-10-CM

## 2021-02-06 DIAGNOSIS — E119 Type 2 diabetes mellitus without complications: Secondary | ICD-10-CM

## 2021-02-06 DIAGNOSIS — L570 Actinic keratosis: Secondary | ICD-10-CM

## 2021-02-06 DIAGNOSIS — L219 Seborrheic dermatitis, unspecified: Secondary | ICD-10-CM

## 2021-02-06 DIAGNOSIS — C4441 Basal cell carcinoma of skin of scalp and neck: Secondary | ICD-10-CM | POA: Diagnosis not present

## 2021-02-06 MED ORDER — KETOCONAZOLE 2 % EX CREA: 1.0000 "application " | TOPICAL_CREAM | Freq: Every day | CUTANEOUS | 0 refills | Status: AC

## 2021-02-06 MED ORDER — ALCLOMETASONE DIPROPIONATE 0.05 % EX CREA: TOPICAL_CREAM | Freq: Two times a day (BID) | CUTANEOUS | 3 refills | Status: DC | PRN

## 2021-02-19 ENCOUNTER — Ambulatory Visit (INDEPENDENT_AMBULATORY_CARE_PROVIDER_SITE_OTHER): Payer: Medicare Other

## 2021-02-19 DIAGNOSIS — I495 Sick sinus syndrome: Secondary | ICD-10-CM | POA: Diagnosis not present

## 2021-02-19 LAB — CUP PACEART REMOTE DEVICE CHECK
Battery Remaining Longevity: 173 mo
Battery Voltage: 3.21 V
Brady Statistic AP VP Percent: 0.03 %
Brady Statistic AP VS Percent: 60.97 %
Brady Statistic AS VP Percent: 0.03 %
Brady Statistic AS VS Percent: 39.1 %
Brady Statistic RA Percent Paced: 38.63 %
Brady Statistic RV Percent Paced: 2.46 %
Date Time Interrogation Session: 20220503002458
Implantable Lead Implant Date: 20220131
Implantable Lead Implant Date: 20220131
Implantable Lead Location: 753859
Implantable Lead Location: 753860
Implantable Lead Model: 5076
Implantable Lead Model: 5076
Implantable Pulse Generator Implant Date: 20220131
Lead Channel Impedance Value: 342 Ohm
Lead Channel Impedance Value: 361 Ohm
Lead Channel Impedance Value: 380 Ohm
Lead Channel Impedance Value: 532 Ohm
Lead Channel Pacing Threshold Amplitude: 0.5 V
Lead Channel Pacing Threshold Amplitude: 0.875 V
Lead Channel Pacing Threshold Pulse Width: 0.4 ms
Lead Channel Pacing Threshold Pulse Width: 0.4 ms
Lead Channel Sensing Intrinsic Amplitude: 1.375 mV
Lead Channel Sensing Intrinsic Amplitude: 1.375 mV
Lead Channel Sensing Intrinsic Amplitude: 5.875 mV
Lead Channel Sensing Intrinsic Amplitude: 5.875 mV
Lead Channel Setting Pacing Amplitude: 1.75 V
Lead Channel Setting Pacing Amplitude: 2 V
Lead Channel Setting Pacing Pulse Width: 0.4 ms
Lead Channel Setting Sensing Sensitivity: 0.9 mV

## 2021-02-20 ENCOUNTER — Encounter: Payer: Medicare Other | Admitting: Internal Medicine

## 2021-03-01 ENCOUNTER — Encounter: Payer: Self-pay | Admitting: Physician Assistant

## 2021-03-01 NOTE — Progress Notes (Signed)
   Follow-Up Visit   Subjective  Kristen Fox is a 87 y.o. female who presents for the following: Follow-up (F/u neck- anterior- s/p ED&C.  healing good no concerns). Her chronic ear crusting is really bothering her. It itches and burns sometimes. It is definitely flaring right now.  Per chart review she has used antifungals and low potency topical steroids in the past that worked well. It also revealed that she is an insulin dependent diabetic which can be a factor for yeast growth.    The following portions of the chart were reviewed this encounter and updated as appropriate:      Objective  Well appearing patient in no apparent distress; mood and affect are within normal limits.  All skin waist up examined.  Objective  Left Dorsal Hand, Philtrum, Right Dorsal Hand: Erythematous patches with gritty scale.  Objective  Left Preauricular Area, Right Preauricular Area, Right Superior Crus of Antihelix: Erythematous plaques with greasy scale.   Anterior Neck- slightly hyperpigmented macule- no crusting-clear.  Assessment & Plan  AK (actinic keratosis) (3) Left Dorsal Hand; Right Dorsal Hand; Philtrum  Destruction of lesion - Left Dorsal Hand, Philtrum, Right Dorsal Hand Complexity: simple   Destruction method: cryotherapy   Informed consent: discussed and consent obtained   Timeout:  patient name, date of birth, surgical site, and procedure verified Lesion destroyed using liquid nitrogen: Yes   Cryotherapy cycles:  1 Outcome: patient tolerated procedure well with no complications   Post-procedure details: wound care instructions given    Seborrheic dermatitis (3) Right Superior Crus of Antihelix; Left Preauricular Area; Right Preauricular Area  ketoconazole (NIZORAL) 2 % cream - Left Preauricular Area, Right Preauricular Area, Right Superior Crus of Antihelix  alclomethasone (ACLOVATE) 0.05 % cream - Left Preauricular Area, Right Preauricular Area, Right Superior Crus of  Antihelix  Post surgical basal cell carcinoma Anterior neck observe  I, Arwilda Georgia, PA-C, have reviewed all documentation's for this visit.  The documentation on 03/01/21 for the exam, diagnosis, procedures and orders are all accurate and complete.

## 2021-03-06 ENCOUNTER — Encounter: Payer: Medicare Other | Admitting: Internal Medicine

## 2021-03-13 NOTE — Progress Notes (Signed)
Remote pacemaker transmission.   

## 2021-03-20 ENCOUNTER — Other Ambulatory Visit: Payer: Self-pay

## 2021-03-20 ENCOUNTER — Encounter: Payer: Self-pay | Admitting: Internal Medicine

## 2021-03-20 ENCOUNTER — Ambulatory Visit (INDEPENDENT_AMBULATORY_CARE_PROVIDER_SITE_OTHER): Payer: Medicare Other | Admitting: Internal Medicine

## 2021-03-20 VITALS — BP 180/62 | HR 60 | Ht 65.0 in | Wt 162.2 lb

## 2021-03-20 DIAGNOSIS — I10 Essential (primary) hypertension: Secondary | ICD-10-CM

## 2021-03-20 DIAGNOSIS — I495 Sick sinus syndrome: Secondary | ICD-10-CM

## 2021-03-20 MED ORDER — CARVEDILOL 25 MG PO TABS: 25.0000 mg | ORAL_TABLET | Freq: Two times a day (BID) | ORAL | 3 refills | Status: DC

## 2021-03-20 MED ORDER — DILTIAZEM HCL ER COATED BEADS 120 MG PO CP24: 120.0000 mg | ORAL_CAPSULE | Freq: Every day | ORAL | 3 refills | Status: DC

## 2021-03-20 NOTE — Patient Instructions (Signed)
Medication Instructions:  Your physician has recommended you make the following change in your medication:  STOP Metoprolol Tartrate START Carvedilol (Coreg) 25 mg twice daily  *If you need a refill on your cardiac medications before your next appointment, please call your pharmacy*   Lab Work: None If you have labs (blood work) drawn today and your tests are completely normal, you will receive your results only by: Marland Kitchen MyChart Message (if you have MyChart) OR . A paper copy in the mail If you have any lab test that is abnormal or we need to change your treatment, we will call you to review the results.   Testing/Procedures: None   Follow-Up: At [facility], you and your health needs are our priority.  As part of our continuing mission to provide you with exceptional heart care, we have created designated Provider Care Teams.  These Care Teams include your primary Cardiologist (physician) and Advanced Practice Providers (APPs -  Physician Assistants and Nurse Practitioners) who all work together to provide you with the care you need, when you need it.  We recommend signing up for the patient portal called "MyChart".  Sign up information is provided on this After Visit Summary.  MyChart is used to connect with patients for Virtual Visits (Telemedicine).  Patients are able to view lab/test results, encounter notes, upcoming appointments, etc.  Non-urgent messages can be sent to your provider as well.   To learn more about what you can do with MyChart, go to [email or web address].    Your next appointment:   12 month(s)  The format for your next appointment:   In Person  Provider:   Cristopher Peru, MD   Other Instructions Keep BP log for 2-3 weeks and then update our office

## 2021-03-20 NOTE — Progress Notes (Signed)
HPI Mrs. Kristen Fox returns today for followup. She is a pleasant 87 yo woman with a h/o HTN, sinus node dysfunction, and PAF. She presents today for PM followup. She notes non-exertional chest pressure. No edema. No syncope. She does not have palpitations. Allergies  Allergen Reactions  . Metformin Other (See Comments)    Caused stomach upset (tolerates timed release)     Current Outpatient Medications  Medication Sig Dispense Refill  . alclomethasone (ACLOVATE) 0.05 % cream Apply topically 2 (two) times daily as needed (Rash). 180 g 3  . apixaban (ELIQUIS) 2.5 MG TABS tablet Take 1 tablet (2.5 mg total) by mouth 2 (two) times daily. 60 tablet 0  . brimonidine (ALPHAGAN) 0.2 % ophthalmic solution Place 1 drop into both eyes 3 (three) times daily.    . Calcium Carb-Cholecalciferol (CALCIUM 600-D PO) Take 1 tablet by mouth 2 (two) times daily.    . Cholecalciferol (VITAMIN D3 PO) Take 1 tablet by mouth 2 (two) times daily.    Marland Kitchen denosumab (PROLIA) 60 MG/ML SOLN injection Inject 60 mg into the skin every 6 (six) months. Administer in upper arm, thigh, or abdomen    . diltiazem (CARDIZEM CD) 120 MG 24 hr capsule Take 1 capsule (120 mg total) by mouth daily. 30 capsule 3  . dorzolamide-timolol (COSOPT) 22.3-6.8 MG/ML ophthalmic solution Place 1 drop into both eyes 2 (two) times daily.    Marland Kitchen esomeprazole (NEXIUM) 20 MG capsule Take 20 mg by mouth daily at 12 noon.    . gabapentin (NEURONTIN) 100 MG capsule Take 1 capsule (100 mg total) by mouth 2 (two) times daily. 60 capsule 1  . hydrochlorothiazide (HYDRODIURIL) 25 MG tablet Take 25 mg by mouth daily.    . insulin degludec (TRESIBA FLEXTOUCH) 100 UNIT/ML FlexTouch Pen Inject 10-25 Units into the skin at bedtime. Based on CBG    . insulin lispro (HUMALOG) 100 UNIT/ML KwikPen Inject 10 Units into the skin daily with lunch.    . ketoconazole (NIZORAL) 2 % cream Apply 1 application topically daily. 60 g 0  . latanoprost (XALATAN) 0.005 %  ophthalmic solution Place 1 drop into both eyes at bedtime.    Marland Kitchen lisinopril (ZESTRIL) 40 MG tablet Take 40 mg by mouth daily.    . metFORMIN (GLUCOPHAGE-XR) 500 MG 24 hr tablet Take 1,000 mg by mouth 2 (two) times daily.    . metoprolol tartrate (LOPRESSOR) 100 MG tablet Take 100 mg by mouth 2 (two) times daily.    . montelukast (SINGULAIR) 10 MG tablet Take 10 mg by mouth at bedtime.    . rosuvastatin (CRESTOR) 40 MG tablet Take 40 mg by mouth at bedtime.    . vitamin B-12 (CYANOCOBALAMIN) 500 MCG tablet Take 1,000 mcg by mouth daily.     No current facility-administered medications for this visit.     Past Medical History:  Diagnosis Date  . Asthma due to environmental allergies   . Basal cell carcinoma 05/08/2020   bcc ant. mid neck   . Breast cancer ([place])    2003  . Cataract   . Chronic kidney disease   . Clotting disorder ([place])   . Diabetes mellitus ([place])    type II  . Diabetic coma ([place])   . GERD (gastroesophageal reflux disease)   . Glaucoma   . Hyperlipidemia   . Hypertension   . Osteoporosis   . Pneumonia   . Pulmonary embolism ([place])   . Squamous cell carcinoma of skin 10/18/2014  well diff-right upper arm  . Squamous cell carcinoma of skin 06/02/2019   in situ-upper lip (txpbx)  . Tubular adenoma of colon 2017    ROS:   All systems reviewed and negative except as noted in the HPI.   Past Surgical History:  Procedure Laterality Date  . APPENDECTOMY    . CATARACT EXTRACTION, BILATERAL    . MASTECTOMY     left w/ymph node removal  . PACEMAKER IMPLANT N/A 11/19/2020   Procedure: PACEMAKER IMPLANT;  Surgeon: Deboraha Sprang, MD;  Location: [facility];  Service: Cardiovascular;  Laterality: N/A;  . SKIN CANCER EXCISION    . TONSILLECTOMY    . VAGINAL HYSTERECTOMY  1981     Family History  Problem Relation Age of Onset  . Diabetes Mother   . CVA Mother   . Leukemia Father   . Colon cancer Neg Hx   . Stomach cancer Neg Hx   . Pancreatic  cancer Neg Hx   . Esophageal cancer Neg Hx      Social History   Socioeconomic History  . Marital status: Single    Spouse name: Not on file  . Number of children: Not on file  . Years of education: Not on file  . Highest education level: Not on file  Occupational History  . Not on file  Tobacco Use  . Smoking status: Never Smoker  . Smokeless tobacco: Never Used  Vaping Use  . Vaping Use: Never used  Substance and Sexual Activity  . Alcohol use: No  . Drug use: No  . Sexual activity: Not on file  Other Topics Concern  . Not on file  Social History Narrative  . Not on file   Social Determinants of Health   Financial Resource Strain: Not on file  Food Insecurity: Not on file  Transportation Needs: Not on file  Physical Activity: Not on file  Stress: Not on file  Social Connections: Not on file  Intimate Partner Violence: Not on file     BP (!) 180/62   Pulse 60   Ht 5\' 5"  (1.651 m)   Wt 162 lb 3.2 oz (73.6 kg)   SpO2 95%   BMI 26.99 kg/m   Physical Exam:  Well appearing NAD HEENT: Unremarkable Neck:  No JVD, no thyromegally Lymphatics:  No adenopathy Back:  No CVA tenderness Lungs:  Clear with no wheezes HEART:  Regular rate rhythm, no murmurs, no rubs, no clicks Abd:  soft, positive bowel sounds, no organomegally, no rebound, no guarding Ext:  2 plus pulses, no edema, no cyanosis, no clubbing Skin:  No rashes no nodules Neuro:  CN II through XII intact, motor grossly intact  EKG - nsr with atrial pacing  DEVICE  Normal device function.  See PaceArt for details.   Assess/Plan: 1. PAF - she is out of rhythm 20%. She will continue a strategy of rate control. 2. HTN -her bp is not well controlled. I asked her to stop the metoprolol and switch to coreg 3. PPM - her [employer] device is working normally. We will recheck in several months. 4. Sinus node dysfunction - she is asymptomatic after PPM insertion. 5. Dyslipidemia - she will continue her  crestor. No muscle aches.  Carleene Overlie Saran Laviolette,MD

## 2021-04-26 ENCOUNTER — Encounter: Payer: Self-pay | Admitting: Internal Medicine

## 2021-05-08 ENCOUNTER — Other Ambulatory Visit: Payer: Self-pay

## 2021-05-08 ENCOUNTER — Other Ambulatory Visit: Payer: Self-pay | Admitting: *Deleted

## 2021-05-08 ENCOUNTER — Ambulatory Visit (INDEPENDENT_AMBULATORY_CARE_PROVIDER_SITE_OTHER): Payer: Medicare Other | Admitting: Physician Assistant

## 2021-05-08 ENCOUNTER — Encounter: Payer: Self-pay | Admitting: Physician Assistant

## 2021-05-08 DIAGNOSIS — L570 Actinic keratosis: Secondary | ICD-10-CM

## 2021-05-08 DIAGNOSIS — C4492 Squamous cell carcinoma of skin, unspecified: Secondary | ICD-10-CM

## 2021-05-08 DIAGNOSIS — D485 Neoplasm of uncertain behavior of skin: Secondary | ICD-10-CM | POA: Diagnosis not present

## 2021-05-08 DIAGNOSIS — C44629 Squamous cell carcinoma of skin of left upper limb, including shoulder: Secondary | ICD-10-CM

## 2021-05-08 DIAGNOSIS — L720 Epidermal cyst: Secondary | ICD-10-CM

## 2021-05-08 DIAGNOSIS — D0439 Carcinoma in situ of skin of other parts of face: Secondary | ICD-10-CM

## 2021-05-08 DIAGNOSIS — Z872 Personal history of diseases of the skin and subcutaneous tissue: Secondary | ICD-10-CM

## 2021-05-08 DIAGNOSIS — Z85828 Personal history of other malignant neoplasm of skin: Secondary | ICD-10-CM | POA: Diagnosis not present

## 2021-05-08 HISTORY — DX: Squamous cell carcinoma of skin, unspecified: C44.92

## 2021-05-08 MED ORDER — CARVEDILOL 25 MG PO TABS
25.0000 mg | ORAL_TABLET | Freq: Two times a day (BID) | ORAL | 3 refills | Status: DC
Start: 2021-05-08 — End: 2021-06-13

## 2021-05-08 NOTE — Patient Instructions (Signed)

## 2021-05-09 ENCOUNTER — Encounter: Payer: Self-pay | Admitting: Physician Assistant

## 2021-05-09 NOTE — Progress Notes (Signed)
Follow-Up Visit   Subjective  Kristen Fox is a 87 y.o. female who presents for the following: Follow-up (3 months left cheek crusty lesion new x months, history of bcc, scc & ak ).   The following portions of the chart were reviewed this encounter and updated as appropriate:  Tobacco  Allergies  Meds  Problems  Med Hx  Surg Hx  Fam Hx      Objective  Well appearing patient in no apparent distress; mood and affect are within normal limits.  A full examination was performed including scalp, head, eyes, ears, nose, lips, neck, chest, axillae, abdomen, back, buttocks, bilateral upper extremities, bilateral lower extremities, hands, feet, fingers, toes, fingernails, and toenails. All findings within normal limits unless otherwise noted below.  Left Upper Vermilion Lip Hyperkeratotic scale with pink base      Left Forearm - Posterior Hyperkeratotic scale with pink base      Right Cavum Hyperkeratotic scale with pink base      Left Zygomatic Area Hyperkeratotic scale with pink base      Left Forearm - Posterior, Left Hand - Posterior, Right Forearm - Posterior, Right Hand - Posterior Erythematous patches with gritty scale.   Assessment & Plan  Neoplasm of uncertain behavior of skin (4) Left Upper Vermilion Lip  Skin / nail biopsy Type of biopsy: tangential   Informed consent: discussed and consent obtained   Timeout: patient name, date of birth, surgical site, and procedure verified   Anesthesia: the lesion was anesthetized in a standard fashion   Anesthetic:  1% lidocaine w/ epinephrine 1-100,000 local infiltration Instrument used: flexible razor blade   Hemostasis achieved with: ferric subsulfate   Outcome: patient tolerated procedure well   Post-procedure details: wound care instructions given    Specimen 2 - Surgical pathology Differential Diagnosis: scc vs bcc  Check Margins: yes  Left Forearm - Posterior  Skin / nail biopsy Type of biopsy:  tangential   Informed consent: discussed and consent obtained   Timeout: patient name, date of birth, surgical site, and procedure verified   Anesthesia: the lesion was anesthetized in a standard fashion   Anesthetic:  1% lidocaine w/ epinephrine 1-100,000 local infiltration Instrument used: flexible razor blade   Hemostasis achieved with: ferric subsulfate   Outcome: patient tolerated procedure well   Post-procedure details: wound care instructions given    Specimen 3 - Surgical pathology Differential Diagnosis: scc vs bcc  Check Margins: yes  Right Cavum  Skin / nail biopsy Type of biopsy: tangential   Informed consent: discussed and consent obtained   Timeout: patient name, date of birth, surgical site, and procedure verified   Anesthesia: the lesion was anesthetized in a standard fashion   Anesthetic:  1% lidocaine w/ epinephrine 1-100,000 local infiltration Instrument used: flexible razor blade   Hemostasis achieved with: ferric subsulfate   Outcome: patient tolerated procedure well   Post-procedure details: wound care instructions given    Specimen 1 - Surgical pathology Differential Diagnosis: scc vs bcc  Check Margins: yes  Left Zygomatic Area  Skin / nail biopsy Type of biopsy: tangential   Informed consent: discussed and consent obtained   Timeout: patient name, date of birth, surgical site, and procedure verified   Anesthesia: the lesion was anesthetized in a standard fashion   Anesthetic:  1% lidocaine w/ epinephrine 1-100,000 local infiltration Instrument used: flexible razor blade   Hemostasis achieved with: ferric subsulfate   Outcome: patient tolerated procedure well   Post-procedure details: wound  care instructions given    Specimen 4 - Surgical pathology Differential Diagnosis: scc vs bcc  Check Margins: yes  AK (actinic keratosis) (4) Left Hand - Posterior; Right Hand - Posterior; Left Forearm - Posterior; Right Forearm - Posterior  Destruction  of lesion - Left Forearm - Posterior, Left Hand - Posterior, Right Forearm - Posterior, Right Hand - Posterior Complexity: simple   Destruction method: cryotherapy   Informed consent: discussed and consent obtained   Timeout:  patient name, date of birth, surgical site, and procedure verified Lesion destroyed using liquid nitrogen: Yes   Cryotherapy cycles:  1 Outcome: patient tolerated procedure well with no complications   Post-procedure details: wound care instructions given      I, Samarah Hogle, PA-C, have reviewed all documentation's for this visit.  The documentation on 05/09/21 for the exam, diagnosis, procedures and orders are all accurate and complete.

## 2021-05-21 ENCOUNTER — Ambulatory Visit (INDEPENDENT_AMBULATORY_CARE_PROVIDER_SITE_OTHER): Payer: Medicare Other

## 2021-05-21 DIAGNOSIS — I495 Sick sinus syndrome: Secondary | ICD-10-CM

## 2021-05-21 LAB — CUP PACEART REMOTE DEVICE CHECK
Battery Remaining Longevity: 167 mo
Battery Voltage: 3.19 V
Brady Statistic AP VP Percent: 0.04 %
Brady Statistic AP VS Percent: 80.95 %
Brady Statistic AS VP Percent: 0.03 %
Brady Statistic AS VS Percent: 18.98 %
Brady Statistic RA Percent Paced: 80.94 %
Brady Statistic RV Percent Paced: 0.07 %
Date Time Interrogation Session: 20220802063715
Implantable Lead Implant Date: 20220131
Implantable Lead Implant Date: 20220131
Implantable Lead Location: 753859
Implantable Lead Location: 753860
Implantable Lead Model: 5076
Implantable Lead Model: 5076
Implantable Pulse Generator Implant Date: 20220131
Lead Channel Impedance Value: 323 Ohm
Lead Channel Impedance Value: 342 Ohm
Lead Channel Impedance Value: 361 Ohm
Lead Channel Impedance Value: 475 Ohm
Lead Channel Pacing Threshold Amplitude: 0.5 V
Lead Channel Pacing Threshold Amplitude: 0.625 V
Lead Channel Pacing Threshold Pulse Width: 0.4 ms
Lead Channel Pacing Threshold Pulse Width: 0.4 ms
Lead Channel Sensing Intrinsic Amplitude: 1.375 mV
Lead Channel Sensing Intrinsic Amplitude: 1.375 mV
Lead Channel Sensing Intrinsic Amplitude: 4.875 mV
Lead Channel Sensing Intrinsic Amplitude: 4.875 mV
Lead Channel Setting Pacing Amplitude: 1.5 V
Lead Channel Setting Pacing Amplitude: 2 V
Lead Channel Setting Pacing Pulse Width: 0.4 ms
Lead Channel Setting Sensing Sensitivity: 0.9 mV

## 2021-05-22 ENCOUNTER — Telehealth: Payer: Self-pay

## 2021-05-22 NOTE — Telephone Encounter (Signed)
-----   Message from Warren Danes, [place] sent at 05/22/2021  8:54 AM EDT ----- 30

## 2021-05-22 NOTE — Telephone Encounter (Signed)
Phone call to patient with her pathology results. Patient aware of results.  

## 2021-05-29 ENCOUNTER — Encounter (HOSPITAL_COMMUNITY): Payer: Self-pay | Admitting: Emergency Medicine

## 2021-05-29 ENCOUNTER — Other Ambulatory Visit: Payer: Self-pay

## 2021-05-29 ENCOUNTER — Observation Stay (HOSPITAL_COMMUNITY)
Admission: EM | Admit: 2021-05-29 | Discharge: 2021-05-30 | Disposition: A | Discharge status: Home / Self Care | Payer: Medicare Other | Attending: Family Medicine | Admitting: Family Medicine

## 2021-05-29 ENCOUNTER — Emergency Department (HOSPITAL_COMMUNITY): Payer: Medicare Other

## 2021-05-29 DIAGNOSIS — E1122 Type 2 diabetes mellitus with diabetic chronic kidney disease: Secondary | ICD-10-CM | POA: Diagnosis not present

## 2021-05-29 DIAGNOSIS — Z853 Personal history of malignant neoplasm of breast: Secondary | ICD-10-CM | POA: Insufficient documentation

## 2021-05-29 DIAGNOSIS — R627 Adult failure to thrive: Secondary | ICD-10-CM | POA: Insufficient documentation

## 2021-05-29 DIAGNOSIS — Z794 Long term (current) use of insulin: Secondary | ICD-10-CM | POA: Insufficient documentation

## 2021-05-29 DIAGNOSIS — I129 Hypertensive chronic kidney disease with stage 1 through stage 4 chronic kidney disease, or unspecified chronic kidney disease: Secondary | ICD-10-CM | POA: Diagnosis not present

## 2021-05-29 DIAGNOSIS — E119 Type 2 diabetes mellitus without complications: Secondary | ICD-10-CM

## 2021-05-29 DIAGNOSIS — Z950 Presence of cardiac pacemaker: Secondary | ICD-10-CM | POA: Diagnosis not present

## 2021-05-29 DIAGNOSIS — U071 COVID-19: Secondary | ICD-10-CM | POA: Diagnosis not present

## 2021-05-29 DIAGNOSIS — W19XXXA Unspecified fall, initial encounter: Secondary | ICD-10-CM | POA: Diagnosis present

## 2021-05-29 DIAGNOSIS — Z79899 Other long term (current) drug therapy: Secondary | ICD-10-CM | POA: Insufficient documentation

## 2021-05-29 DIAGNOSIS — R296 Repeated falls: Secondary | ICD-10-CM | POA: Diagnosis present

## 2021-05-29 DIAGNOSIS — J45909 Unspecified asthma, uncomplicated: Secondary | ICD-10-CM | POA: Insufficient documentation

## 2021-05-29 DIAGNOSIS — I442 Atrioventricular block, complete: Secondary | ICD-10-CM | POA: Diagnosis present

## 2021-05-29 DIAGNOSIS — I4819 Other persistent atrial fibrillation: Secondary | ICD-10-CM | POA: Diagnosis present

## 2021-05-29 DIAGNOSIS — E860 Dehydration: Secondary | ICD-10-CM | POA: Diagnosis not present

## 2021-05-29 DIAGNOSIS — Z85828 Personal history of other malignant neoplasm of skin: Secondary | ICD-10-CM | POA: Insufficient documentation

## 2021-05-29 DIAGNOSIS — I10 Essential (primary) hypertension: Secondary | ICD-10-CM | POA: Diagnosis present

## 2021-05-29 DIAGNOSIS — R531 Weakness: Secondary | ICD-10-CM

## 2021-05-29 DIAGNOSIS — N184 Chronic kidney disease, stage 4 (severe): Secondary | ICD-10-CM | POA: Insufficient documentation

## 2021-05-29 DIAGNOSIS — Z7901 Long term (current) use of anticoagulants: Secondary | ICD-10-CM | POA: Insufficient documentation

## 2021-05-29 DIAGNOSIS — E1169 Type 2 diabetes mellitus with other specified complication: Secondary | ICD-10-CM

## 2021-05-29 DIAGNOSIS — Z86711 Personal history of pulmonary embolism: Secondary | ICD-10-CM | POA: Diagnosis present

## 2021-05-29 DIAGNOSIS — Z8616 Personal history of COVID-19: Secondary | ICD-10-CM | POA: Insufficient documentation

## 2021-05-29 LAB — CBC WITH DIFFERENTIAL/PLATELET
Abs Immature Granulocytes: 0.03 10*3/uL (ref 0.00–0.07)
Basophils Absolute: 0 10*3/uL (ref 0.0–0.1)
Basophils Relative: 0 %
Eosinophils Absolute: 0 10*3/uL (ref 0.0–0.5)
Eosinophils Relative: 0 %
HCT: 32.4 % — ABNORMAL LOW (ref 36.0–46.0)
Hemoglobin: 10.3 g/dL — ABNORMAL LOW (ref 12.0–15.0)
Immature Granulocytes: 0 %
Lymphocytes Relative: 16 %
Lymphs Abs: 1.5 10*3/uL (ref 0.7–4.0)
MCH: 27.4 pg (ref 26.0–34.0)
MCHC: 31.8 g/dL (ref 30.0–36.0)
MCV: 86.2 fL (ref 80.0–100.0)
Monocytes Absolute: 1.3 10*3/uL — ABNORMAL HIGH (ref 0.1–1.0)
Monocytes Relative: 14 %
Neutro Abs: 6.5 10*3/uL (ref 1.7–7.7)
Neutrophils Relative %: 70 %
Platelets: 162 10*3/uL (ref 150–400)
RBC: 3.76 MIL/uL — ABNORMAL LOW (ref 3.87–5.11)
RDW: 15.3 % (ref 11.5–15.5)
WBC: 9.3 10*3/uL (ref 4.0–10.5)
nRBC: 0 % (ref 0.0–0.2)

## 2021-05-29 LAB — RESP PANEL BY RT-PCR (FLU A&B, COVID) ARPGX2
Influenza A by PCR: NEGATIVE
Influenza B by PCR: NEGATIVE
SARS Coronavirus 2 by RT PCR: POSITIVE — AB

## 2021-05-29 LAB — LACTATE DEHYDROGENASE: LDH: 149 U/L (ref 98–192)

## 2021-05-29 LAB — COMPREHENSIVE METABOLIC PANEL
ALT: 20 U/L (ref 0–44)
AST: 34 U/L (ref 15–41)
Albumin: 3.8 g/dL (ref 3.5–5.0)
Alkaline Phosphatase: 29 U/L — ABNORMAL LOW (ref 38–126)
Anion gap: 11 (ref 5–15)
BUN: 38 mg/dL — ABNORMAL HIGH (ref 8–23)
CO2: 22 mmol/L (ref 22–32)
Calcium: 9.1 mg/dL (ref 8.9–10.3)
Chloride: 102 mmol/L (ref 98–111)
Creatinine, Ser: 1.85 mg/dL — ABNORMAL HIGH (ref 0.44–1.00)
GFR, Estimated: 27 mL/min — ABNORMAL LOW (ref >60)
Glucose, Bld: 200 mg/dL — ABNORMAL HIGH (ref 70–99)
Potassium: 4.3 mmol/L (ref 3.5–5.1)
Sodium: 135 mmol/L (ref 135–145)
Total Bilirubin: 0.7 mg/dL (ref 0.3–1.2)
Total Protein: 7 g/dL (ref 6.5–8.1)

## 2021-05-29 LAB — LACTIC ACID, PLASMA
Lactic Acid, Venous: 1.7 mmol/L (ref 0.5–1.9)
Lactic Acid, Venous: 1.3 mmol/L (ref 0.5–1.9)

## 2021-05-29 LAB — FERRITIN: Ferritin: 57 ng/mL (ref 11–307)

## 2021-05-29 LAB — D-DIMER, QUANTITATIVE: D-Dimer, Quant: 0.54 ug/mL-FEU — ABNORMAL HIGH (ref 0.00–0.50)

## 2021-05-29 LAB — GLUCOSE, CAPILLARY: Glucose-Capillary: 137 mg/dL — ABNORMAL HIGH (ref 70–99)

## 2021-05-29 LAB — FIBRINOGEN: Fibrinogen: 606 mg/dL — ABNORMAL HIGH (ref 210–475)

## 2021-05-29 LAB — TRIGLYCERIDES: Triglycerides: 116 mg/dL (ref <150)

## 2021-05-29 LAB — PROCALCITONIN: Procalcitonin: 0.23 ng/mL

## 2021-05-29 LAB — C-REACTIVE PROTEIN: CRP: 3.5 mg/dL — ABNORMAL HIGH (ref <1.0)

## 2021-05-29 MED ORDER — ALBUTEROL SULFATE HFA 108 (90 BASE) MCG/ACT IN AERS: 2.0000 | INHALATION_SPRAY | Freq: Four times a day (QID) | RESPIRATORY_TRACT | Status: DC | PRN

## 2021-05-29 MED ORDER — CARVEDILOL 12.5 MG PO TABS
25.0000 mg | ORAL_TABLET | Freq: Two times a day (BID) | ORAL | Status: DC
  Administered 2021-05-29 – 2021-05-30 (×2): 25 mg via ORAL
  Filled 2021-05-29 (×2): qty 2

## 2021-05-29 MED ORDER — ASCORBIC ACID 500 MG PO TABS
500.0000 mg | ORAL_TABLET | Freq: Every day | ORAL | Status: DC
  Administered 2021-05-30: 500 mg via ORAL
  Filled 2021-05-29: qty 1

## 2021-05-29 MED ORDER — ALBUTEROL SULFATE HFA 108 (90 BASE) MCG/ACT IN AERS: 2.0000 | INHALATION_SPRAY | Freq: Four times a day (QID) | RESPIRATORY_TRACT | Status: DC

## 2021-05-29 MED ORDER — INSULIN ASPART 100 UNIT/ML IJ SOLN
0.0000 [IU] | Freq: Three times a day (TID) | INTRAMUSCULAR | Status: DC
  Administered 2021-05-30: 3 [IU] via SUBCUTANEOUS

## 2021-05-29 MED ORDER — LIDOCAINE VISCOUS HCL 2 % MT SOLN
15.0000 mL | Freq: Once | OROMUCOSAL | Status: AC
  Administered 2021-05-29: 15 mL via OROMUCOSAL
  Filled 2021-05-29: qty 15

## 2021-05-29 MED ORDER — POLYETHYLENE GLYCOL 3350 17 G PO PACK: 17.0000 g | PACK | Freq: Every day | ORAL | Status: DC | PRN

## 2021-05-29 MED ORDER — INSULIN GLARGINE-YFGN 100 UNIT/ML ~~LOC~~ SOLN
10.0000 [IU] | Freq: Every day | SUBCUTANEOUS | Status: DC
  Administered 2021-05-29: 10 [IU] via SUBCUTANEOUS
  Filled 2021-05-29 (×2): qty 0.1

## 2021-05-29 MED ORDER — SODIUM CHLORIDE 0.9 % IV BOLUS
500.0000 mL | Freq: Once | INTRAVENOUS | Status: AC
  Administered 2021-05-29: 500 mL via INTRAVENOUS

## 2021-05-29 MED ORDER — LISINOPRIL 10 MG PO TABS
40.0000 mg | ORAL_TABLET | Freq: Every day | ORAL | Status: DC
  Administered 2021-05-30: 40 mg via ORAL
  Filled 2021-05-29: qty 4

## 2021-05-29 MED ORDER — ONDANSETRON HCL 4 MG/2ML IJ SOLN: 4.0000 mg | Freq: Four times a day (QID) | INTRAMUSCULAR | Status: DC | PRN

## 2021-05-29 MED ORDER — INSULIN ASPART 100 UNIT/ML IJ SOLN: 0.0000 [IU] | Freq: Every day | INTRAMUSCULAR | Status: DC

## 2021-05-29 MED ORDER — GABAPENTIN 100 MG PO CAPS
100.0000 mg | ORAL_CAPSULE | Freq: Two times a day (BID) | ORAL | Status: DC
  Administered 2021-05-29 – 2021-05-30 (×2): 100 mg via ORAL
  Filled 2021-05-29 (×2): qty 1

## 2021-05-29 MED ORDER — DILTIAZEM HCL ER COATED BEADS 120 MG PO CP24
120.0000 mg | ORAL_CAPSULE | Freq: Every day | ORAL | Status: DC
  Administered 2021-05-30: 120 mg via ORAL
  Filled 2021-05-29: qty 1

## 2021-05-29 MED ORDER — DORZOLAMIDE HCL-TIMOLOL MAL 2-0.5 % OP SOLN
1.0000 [drp] | Freq: Two times a day (BID) | OPHTHALMIC | Status: DC
  Administered 2021-05-29 – 2021-05-30 (×2): 1 [drp] via OPHTHALMIC
  Filled 2021-05-29: qty 10

## 2021-05-29 MED ORDER — PANTOPRAZOLE SODIUM 40 MG PO TBEC
40.0000 mg | DELAYED_RELEASE_TABLET | Freq: Every day | ORAL | Status: DC
  Administered 2021-05-30: 40 mg via ORAL
  Filled 2021-05-29: qty 1

## 2021-05-29 MED ORDER — ONDANSETRON HCL 4 MG PO TABS: 4.0000 mg | ORAL_TABLET | Freq: Four times a day (QID) | ORAL | Status: DC | PRN

## 2021-05-29 MED ORDER — TRAMADOL HCL 50 MG PO TABS
50.0000 mg | ORAL_TABLET | Freq: Once | ORAL | Status: AC
Start: 2021-05-29 — End: 2021-05-29
  Administered 2021-05-29: 50 mg via ORAL
  Filled 2021-05-29: qty 1

## 2021-05-29 MED ORDER — INSULIN DEGLUDEC 100 UNIT/ML ~~LOC~~ SOPN: 10.0000 [IU] | PEN_INJECTOR | Freq: Every day | SUBCUTANEOUS | Status: DC

## 2021-05-29 MED ORDER — SODIUM CHLORIDE 0.9 % IV SOLN: INTRAVENOUS | Status: DC

## 2021-05-29 MED ORDER — ZINC SULFATE 220 (50 ZN) MG PO CAPS
220.0000 mg | ORAL_CAPSULE | Freq: Every day | ORAL | Status: DC
  Administered 2021-05-30: 220 mg via ORAL
  Filled 2021-05-29: qty 1

## 2021-05-29 MED ORDER — APIXABAN 2.5 MG PO TABS
2.5000 mg | ORAL_TABLET | Freq: Two times a day (BID) | ORAL | Status: DC
  Administered 2021-05-29 – 2021-05-30 (×2): 2.5 mg via ORAL
  Filled 2021-05-29 (×2): qty 1

## 2021-05-29 MED ORDER — SODIUM CHLORIDE 0.9 % IV SOLN
1000.0000 mL | INTRAVENOUS | Status: DC
  Administered 2021-05-29: 1000 mL via INTRAVENOUS

## 2021-05-29 MED ORDER — GUAIFENESIN-DM 100-10 MG/5ML PO SYRP
10.0000 mL | ORAL_SOLUTION | Freq: Three times a day (TID) | ORAL | Status: DC
  Administered 2021-05-29 – 2021-05-30 (×3): 10 mL via ORAL
  Filled 2021-05-29 (×3): qty 10

## 2021-05-29 NOTE — H&P (Signed)
History and Physical    Kristen Fox UKG:[id number] DOB: 24-Sep-1934 DOA: 05/29/2021  PCP: Josem Kaufmann, MD   Patient coming from: Home  I have personally briefly reviewed patient's old medical records in [place]  Chief Complaint: Weakness, falls, COVID-positive.  HPI: Kristen Fox is a 87 y.o. female with medical history significant for hypertension, diabetes mellitus, pulmonary embolism, CKD 4, atrial fibrillation, complete heart block. Patient was brought to the ED reports of generalized weakness, productive cough, sore throat limiting oral intake started 2 days ago- 8/8.  Daughter present at bedside.  Patient has had to 2 falls since onset of symptoms, sustaining a bruise to the left side of her upper lip.  She has had dry heaves, no vomiting no loose stools. Patient is vaccinated x2 and has received 2 booster doses.  Daughter reports patient had COVID during the start of the pandemic, with pneumonia and blood clots. Patient tested at home and was negative for COVID 8/8, but presented to an urgent care 8/9, was swabbed and tested positive and was prescribed Paxlovid and azithromycin.  She has taken 3 doses of Paxlovid and has taken 2 of the prescribed 5-day course of azithromycin.  She is also on Eliquis and Crestor..  ED Course: Heart pressure systolic 76-2 83T.  Temperature 99.  O2 sats greater than 96% on room air.  Chest x-ray without acute abnormality.  Head CT will also.  Mild increase in some inflammatory markers CRP 3.5, fibrinogen.  Lactic acid 1.7. Hospitalist to admit for generalized weakness, falls.  Review of Systems: As per HPI all other systems reviewed and negative.  Past Medical History:  Diagnosis Date   Asthma due to environmental allergies    Basal cell carcinoma 05/08/2020   bcc ant. mid neck    Breast cancer ([place])    2003   Cataract    Chronic kidney disease    Clotting disorder ([place])    Diabetes mellitus ([place])    type II   Diabetic coma  ([place])    GERD (gastroesophageal reflux disease)    Glaucoma    Hyperlipidemia    Hypertension    Osteoporosis    Pneumonia    Pulmonary embolism ([place])    Squamous cell carcinoma of skin 10/18/2014   well diff-right upper arm cx3 15fu   Squamous cell carcinoma of skin 06/02/2019   in situ-upper lip (txpbx)   Tubular adenoma of colon 2017    Past Surgical History:  Procedure Laterality Date   APPENDECTOMY     CATARACT EXTRACTION, BILATERAL     MASTECTOMY     left w/ymph node removal   PACEMAKER IMPLANT N/A 11/19/2020   Procedure: PACEMAKER IMPLANT;  Surgeon: Deboraha Sprang, MD;  Location: [facility];  Service: Cardiovascular;  Laterality: N/A;   SKIN CANCER EXCISION     TONSILLECTOMY     VAGINAL HYSTERECTOMY  1981     reports that she has never smoked. She has never used smokeless tobacco. She reports that she does not drink alcohol and does not use drugs.  Allergies  Allergen Reactions   Metformin Other (See Comments)    Caused stomach upset (tolerates timed release)    Family History  Problem Relation Age of Onset   Diabetes Mother    CVA Mother    Leukemia Father    Colon cancer Neg Hx    Stomach cancer Neg Hx    Pancreatic cancer Neg Hx    Esophageal cancer Neg Hx  Prior to Admission medications   Medication Sig Start Date End Date Taking? Authorizing Provider  alclomethasone (ACLOVATE) 0.05 % cream Apply topically 2 (two) times daily as needed (Rash). 02/06/21  Yes [place], Vida Roller R, PA-C  apixaban (ELIQUIS) 2.5 MG TABS tablet Take 1 tablet (2.5 mg total) by mouth 2 (two) times daily. 11/21/20  Yes Annita Brod, MD  azithromycin (ZITHROMAX) 250 MG tablet Take 250 mg by mouth as directed. 05/28/21  Yes [provider]  brimonidine (ALPHAGAN) 0.2 % ophthalmic solution Place 1 drop into both eyes 3 (three) times daily.   Yes [provider]  Calcium Carb-Cholecalciferol (CALCIUM 600-D PO) Take 1 tablet by mouth 2 (two) times daily.    Yes [provider]  carvedilol (COREG) 25 MG tablet Take 1 tablet (25 mg total) by mouth 2 (two) times daily. 05/08/21 08/06/21 Yes Evans Lance, MD  Cholecalciferol (VITAMIN D3 PO) Take 1 tablet by mouth 2 (two) times daily.   Yes [provider]  denosumab (PROLIA) 60 MG/ML SOLN injection Inject 60 mg into the skin every 6 (six) months. Administer in upper arm, thigh, or abdomen   Yes [provider]  diltiazem (CARDIZEM CD) 120 MG 24 hr capsule Take 1 capsule (120 mg total) by mouth daily. 03/20/21  Yes Evans Lance, MD  dorzolamide-timolol (COSOPT) 22.3-6.8 MG/ML ophthalmic solution Place 1 drop into both eyes 2 (two) times daily.   Yes [provider]  esomeprazole (NEXIUM) 20 MG capsule Take 20 mg by mouth daily at 12 noon.   Yes [provider]  FARXIGA 10 MG TABS tablet Take 10 mg by mouth daily. 04/30/21  Yes [provider]  gabapentin (NEURONTIN) 100 MG capsule Take 1 capsule (100 mg total) by mouth 2 (two) times daily. 11/21/20  Yes Annita Brod, MD  hydrochlorothiazide (HYDRODIURIL) 25 MG tablet Take 25 mg by mouth daily. 09/27/20  Yes [provider]  insulin degludec (TRESIBA FLEXTOUCH) 100 UNIT/ML FlexTouch Pen Inject 10-25 Units into the skin at bedtime. Based on CBG   Yes [provider]  insulin lispro (HUMALOG) 100 UNIT/ML KwikPen Inject 10 Units into the skin daily with lunch.   Yes [provider]  latanoprost (XALATAN) 0.005 % ophthalmic solution Place 1 drop into both eyes at bedtime. 08/23/20  Yes [provider]  lisinopril (ZESTRIL) 40 MG tablet Take 40 mg by mouth daily. 09/27/20  Yes [provider]  metFORMIN (GLUCOPHAGE-XR) 500 MG 24 hr tablet Take 1,000 mg by mouth 2 (two) times daily. 06/26/20  Yes [provider]  montelukast (SINGULAIR) 10 MG tablet Take 10 mg by mouth at bedtime.   Yes [provider]  PAXLOVID 20 x 150 MG & 10 x 100MG  TBPK Take 2  tablets by mouth in the morning and at bedtime. Starting 8.9.22 05/28/21  Yes [provider]  rosuvastatin (CRESTOR) 40 MG tablet Take 40 mg by mouth at bedtime.   Yes [provider]  vitamin B-12 (CYANOCOBALAMIN) 500 MCG tablet Take 1,000 mcg by mouth daily.   Yes [provider]    Physical Exam: Vitals:   05/29/21 1635 05/29/21 1700 05/29/21 1730 05/29/21 1800  BP: (!) 145/46 (!) 131/45 (!) 132/47 (!) 140/121  Pulse: (!) 59 64 64 65  Resp: (!) 22 19 14 15   Temp:      TempSrc:      SpO2: 99% 98% 97% 96%  Weight:      Height:  Constitutional: NAD, calm, comfortable Vitals:   05/29/21 1635 05/29/21 1700 05/29/21 1730 05/29/21 1800  BP: (!) 145/46 (!) 131/45 (!) 132/47 (!) 140/121  Pulse: (!) 59 64 64 65  Resp: (!) 22 19 14 15   Temp:      TempSrc:      SpO2: 99% 98% 97% 96%  Weight:      Height:       Eyes: PERRL, lids and conjunctivae normal ENMT: Mucous membranes are moist.  Neck: normal, supple, no masses, no thyromegaly Respiratory: clear to auscultation bilaterally, no wheezing, no crackles. Normal respiratory effort. No accessory muscle use.  Cardiovascular: Regular rate and rhythm, no murmurs / rubs / gallops. No extremity edema. 2+ pedal pulses. No carotid bruits.  Pacemaker pocket left upper chest. Abdomen: no tenderness, no masses palpated. No hepatosplenomegaly. Bowel sounds positive.  Musculoskeletal: no clubbing / cyanosis. No joint deformity upper and lower extremities. Good ROM, no contractures. Normal muscle tone.  Skin: no rashes, lesions, ulcers. No induration Neurologic: No apparent cranial nerve abnormality, moving extremities spontaneously. Psychiatric: Normal judgment and insight. Alert and oriented x 3. Normal mood.   Labs on Admission: I have personally reviewed following labs and imaging studies  CBC: Recent Labs  Lab 05/29/21 1621  WBC 9.3  NEUTROABS 6.5  HGB 10.3*  HCT 32.4*  MCV 86.2  PLT 630   Basic  Metabolic Panel: Recent Labs  Lab 05/29/21 1621  NA 135  K 4.3  CL 102  CO2 22  GLUCOSE 200*  BUN 38*  CREATININE 1.85*  CALCIUM 9.1    Liver Function Tests: Recent Labs  Lab 05/29/21 1621  AST 34  ALT 20  ALKPHOS 29*  BILITOT 0.7  PROT 7.0  ALBUMIN 3.8   Lipid Profile: Recent Labs    05/29/21 1621  TRIG 116   Anemia Panel: Recent Labs    05/29/21 1621  FERRITIN 75   Radiological Exams on Admission: CT HEAD WO CONTRAST (5MM)  Result Date: 05/29/2021 CLINICAL DATA:  Head trauma EXAM: CT HEAD WITHOUT CONTRAST TECHNIQUE: Contiguous axial images were obtained from the base of the skull through the vertex without intravenous contrast. COMPARISON:  None. FINDINGS: Brain: No acute territorial infarction, hemorrhage, or intracranial mass. Moderate atrophy. Nonenlarged ventricles Vascular: No hyperdense vessels. Scattered carotid vascular calcification Skull: Normal. Negative for fracture or focal lesion. Sinuses/Orbits: No acute finding. Other: None IMPRESSION: 1. No CT evidence for acute intracranial abnormality. 2. Atrophy Electronically Signed   By: Donavan Foil M.D.   On: 05/29/2021 17:11   DG Chest Port 1 View  Result Date: 05/29/2021 CLINICAL DATA:  Positive for COVID and strep throat, shortness of breath EXAM: PORTABLE CHEST 1 VIEW COMPARISON:  11/20/2018 FINDINGS: Left chest pacemaker with leads in the right atrium and right ventricle. Unchanged cardiac and mediastinal silhouettes. No focal consolidative opacity. No pleural effusion. No acute osseous abnormality. IMPRESSION: No acute cardiopulmonary process. Electronically Signed   By: Merilyn Baba MD   On: 05/29/2021 16:21    EKG: Independently reviewed.  EKG shows junctional rhythm seen, some pacer spikes present.  Rate 60, QTc 441.  No significant changes.  Assessment/Plan Principal Problem:   Generalized weakness Active Problems:   Complete heart block (H[facility])   Hypertension   Diabetes mellitus ([place])    History of pulmonary embolism   Persistent atrial fibrillation ([place])   COVID-19 virus infection   Falls    Generalized weakness/falls-secondary to COVID-19 infection with resultant poor oral intake.  Head CT without  acute abnormality. -Consider physical therapy evaluation prior to discharge -500 mill bolus given, continue N/s 75cc/hr x 15hrs  COVID 19 infection-temperature 99. Sore-throat with poor oral intake. Not Hypoxic.  Chest x-ray without acute abnormality.  Vaccinated x2 and has had 2 booster doses.  Started on Paxlovid yesterday, took her 3rd dose this morning.   - patient is on Eliquis and Crestor, erythromycin was also prescribed yesterday which she has been taking- (Per UTD- all have possible drug-drug interactions with paxlovid). -Per pharmacy, with the drug drug interactions, agree would not to be a good idea to continue Paxlovid. -Mucolytic's - Inhalers, multivitamins -Trend inflammatory markers  CKD 4-creatinine today 1.8, baseline appears between 1.4-1.9.  Likely some dehydration. -Hydrate  Persistent atrial fibrillation, complete heart block, pacemaker status-rate controlled and on anticoagulation with Eliquis. -Resume Cardizem -Resume carvedilol and diltiazem  Controlled DM-A1c 7.3 -Resume Lantus at 10 units nightly - SSI- S -Hold Farxiga, metformin -Resume gabapentin  HTN-stable. - Resume lisinopril, carvedilol, diltiazem -Hold HCTZ 25 while hydrating  DVT prophylaxis: Continue Eliquis Code Status: Full code, confirmed with daughter at bedside Family Communication: daughter Lattie Haw at bedside is HCPOA. Disposition Plan:  ~ 1-2 days Consults called: None Admission status: Obs tele   Bethena Roys MD [facility]  05/29/2021, 9:26 PM

## 2021-05-29 NOTE — ED Notes (Signed)
Pt took a couple of steps in the room but was to weak to walk to far. pts sats remained 98% on RA. MD aware

## 2021-05-29 NOTE — ED Provider Notes (Signed)
[facility] EMERGENCY DEPARTMENT Provider Note   CSN: [id number] Arrival date & time: 05/29/21  1507     History Chief Complaint  Patient presents with   Sore Throat    Kristen Fox is a 87 y.o. female.  Pt presents to the ED today with weakness.  Pt has been sick for a few days.  She was diagnosed with Covid and Strep yesterday at an UC.  She was fully vaccinated + booster.  She was given a rx for erythromycin and Paxlovid (although she is on Eliquis).  She is having a difficult time swallowing and is very weak.  Her daughter said she has fallen twice and hit her face.  She is also having diarrhea.      Past Medical History:  Diagnosis Date   Asthma due to environmental allergies    Basal cell carcinoma 05/08/2020   bcc ant. mid neck    Breast cancer ([place])    2003   Cataract    Chronic kidney disease    Clotting disorder ([place])    Diabetes mellitus ([place])    type II   Diabetic coma ([place])    GERD (gastroesophageal reflux disease)    Glaucoma    Hyperlipidemia    Hypertension    Osteoporosis    Pneumonia    Pulmonary embolism ([place])    Squamous cell carcinoma of skin 10/18/2014   well diff-right upper arm [id number]   Squamous cell carcinoma of skin 06/02/2019   in situ-upper lip (txpbx)   Tubular adenoma of colon 2017    Patient Active Problem List   Diagnosis Date Noted   Generalized weakness 05/29/2021   COVID-19 virus infection 05/29/2021   Falls 05/29/2021   Persistent atrial fibrillation ([place]) 11/27/2020   Secondary hypercoagulable state ([place]) 11/27/2020   Overweight (BMI 25.0-29.9) 11/21/2020   Acute lower UTI 11/21/2020   Hypertension    Diabetes mellitus ([place])    History of pulmonary embolism    CKD (chronic kidney disease), stage IV ([place])    Complete heart block ([place])    Tachy-brady syndrome ([place]) 11/17/2020    Past Surgical History:  Procedure Laterality Date   APPENDECTOMY     CATARACT EXTRACTION, BILATERAL     MASTECTOMY     left w/ymph  node removal   PACEMAKER IMPLANT N/A 11/19/2020   Procedure: PACEMAKER IMPLANT;  Surgeon: Deboraha Sprang, MD;  Location: [facility];  Service: Cardiovascular;  Laterality: N/A;   SKIN CANCER EXCISION     TONSILLECTOMY     VAGINAL HYSTERECTOMY  1981     OB History   No obstetric history on file.     Family History  Problem Relation Age of Onset   Diabetes Mother    CVA Mother    Leukemia Father    Colon cancer Neg Hx    Stomach cancer Neg Hx    Pancreatic cancer Neg Hx    Esophageal cancer Neg Hx     Social History   Tobacco Use   Smoking status: Never   Smokeless tobacco: Never  Vaping Use   Vaping Use: Never used  Substance Use Topics   Alcohol use: No   Drug use: No    Home Medications Prior to Admission medications   Medication Sig Start Date End Date Taking? Authorizing Provider  alclomethasone (ACLOVATE) 0.05 % cream Apply topically 2 (two) times daily as needed (Rash). 02/06/21  Yes Sheffield, Vida Roller R, PA-C  apixaban (ELIQUIS) 2.5 MG TABS tablet  Take 1 tablet (2.5 mg total) by mouth 2 (two) times daily. 11/21/20  Yes Annita Brod, MD  azithromycin (ZITHROMAX) 250 MG tablet Take 250 mg by mouth as directed. 05/28/21  Yes [provider]  brimonidine (ALPHAGAN) 0.2 % ophthalmic solution Place 1 drop into both eyes 3 (three) times daily.   Yes [provider]  Calcium Carb-Cholecalciferol (CALCIUM 600-D PO) Take 1 tablet by mouth 2 (two) times daily.   Yes [provider]  carvedilol (COREG) 25 MG tablet Take 1 tablet (25 mg total) by mouth 2 (two) times daily. 05/08/21 08/06/21 Yes Evans Lance, MD  Cholecalciferol (VITAMIN D3 PO) Take 1 tablet by mouth 2 (two) times daily.   Yes [provider]  denosumab (PROLIA) 60 MG/ML SOLN injection Inject 60 mg into the skin every 6 (six) months. Administer in upper arm, thigh, or abdomen   Yes [provider]  diltiazem (CARDIZEM CD) 120 MG 24 hr capsule Take 1 capsule  (120 mg total) by mouth daily. 03/20/21  Yes Evans Lance, MD  dorzolamide-timolol (COSOPT) 22.3-6.8 MG/ML ophthalmic solution Place 1 drop into both eyes 2 (two) times daily.   Yes [provider]  esomeprazole (NEXIUM) 20 MG capsule Take 20 mg by mouth daily at 12 noon.   Yes [provider]  FARXIGA 10 MG TABS tablet Take 10 mg by mouth daily. 04/30/21  Yes [provider]  gabapentin (NEURONTIN) 100 MG capsule Take 1 capsule (100 mg total) by mouth 2 (two) times daily. 11/21/20  Yes Annita Brod, MD  hydrochlorothiazide (HYDRODIURIL) 25 MG tablet Take 25 mg by mouth daily. 09/27/20  Yes [provider]  insulin degludec (TRESIBA FLEXTOUCH) 100 UNIT/ML FlexTouch Pen Inject 10-25 Units into the skin at bedtime. Based on CBG   Yes [provider]  insulin lispro (HUMALOG) 100 UNIT/ML KwikPen Inject 10 Units into the skin daily with lunch.   Yes [provider]  latanoprost (XALATAN) 0.005 % ophthalmic solution Place 1 drop into both eyes at bedtime. 08/23/20  Yes [provider]  lisinopril (ZESTRIL) 40 MG tablet Take 40 mg by mouth daily. 09/27/20  Yes [provider]  metFORMIN (GLUCOPHAGE-XR) 500 MG 24 hr tablet Take 1,000 mg by mouth 2 (two) times daily. 06/26/20  Yes [provider]  montelukast (SINGULAIR) 10 MG tablet Take 10 mg by mouth at bedtime.   Yes [provider]  PAXLOVID 20 x 150 MG & 10 x 100MG  TBPK Take 2 tablets by mouth in the morning and at bedtime. Starting 8.9.22 05/28/21  Yes [provider]  rosuvastatin (CRESTOR) 40 MG tablet Take 40 mg by mouth at bedtime.   Yes [provider]  vitamin B-12 (CYANOCOBALAMIN) 500 MCG tablet Take 1,000 mcg by mouth daily.   Yes [provider]    Allergies    Metformin  Review of Systems   Review of Systems  Constitutional:  Positive for appetite change, fatigue and fever.  HENT:  Positive for sore throat.    Gastrointestinal:  Positive for diarrhea.  All other systems reviewed and are negative.  Physical Exam Updated Vital Signs BP (!) 140/121   Pulse 65   Temp 99 F (37.2 C) (Oral)   Resp 15   Ht 5\' 5"  (1.651 m)   Wt 73.6 kg   SpO2 96%   BMI 27.00 kg/m   Physical Exam Vitals and nursing note reviewed.  Constitutional:      Appearance: She is  well-developed.  HENT:     Head: Normocephalic and atraumatic.     Mouth/Throat:     Mouth: Mucous membranes are dry.  Eyes:     Conjunctiva/sclera: Conjunctivae normal.     Pupils: Pupils are equal, round, and reactive to light.  Cardiovascular:     Rate and Rhythm: Normal rate and regular rhythm.     Heart sounds: Normal heart sounds.  Pulmonary:     Effort: Pulmonary effort is normal.  Abdominal:     Palpations: Abdomen is soft.  Musculoskeletal:     Cervical back: Normal range of motion and neck supple.  Skin:    General: Skin is warm.     Capillary Refill: Capillary refill takes less than 2 seconds.  Neurological:     General: No focal deficit present.     Mental Status: She is alert and oriented to person, place, and time.  Psychiatric:        Mood and Affect: Mood normal.        Behavior: Behavior normal.    ED Results / Procedures / Treatments   Labs (all labs ordered are listed, but only abnormal results are displayed) Labs Reviewed  RESP PANEL BY RT-PCR (FLU A&B, COVID) ARPGX2 - Abnormal; Notable for the following components:      Result Value   SARS Coronavirus 2 by RT PCR POSITIVE (*)    All other components within normal limits  CBC WITH DIFFERENTIAL/PLATELET - Abnormal; Notable for the following components:   RBC 3.76 (*)    Hemoglobin 10.3 (*)    HCT 32.4 (*)    Monocytes Absolute 1.3 (*)    All other components within normal limits  COMPREHENSIVE METABOLIC PANEL - Abnormal; Notable for the following components:   Glucose, Bld 200 (*)    BUN 38 (*)    Creatinine, Ser 1.85 (*)    Alkaline Phosphatase  29 (*)    GFR, Estimated 27 (*)    All other components within normal limits  D-DIMER, QUANTITATIVE - Abnormal; Notable for the following components:   D-Dimer, Quant 0.54 (*)    All other components within normal limits  FIBRINOGEN - Abnormal; Notable for the following components:   Fibrinogen 606 (*)    All other components within normal limits  C-REACTIVE PROTEIN - Abnormal; Notable for the following components:   CRP 3.5 (*)    All other components within normal limits  CULTURE, BLOOD (ROUTINE X 2)  CULTURE, BLOOD (ROUTINE X 2)  LACTIC ACID, PLASMA  PROCALCITONIN  LACTATE DEHYDROGENASE  FERRITIN  TRIGLYCERIDES  LACTIC ACID, PLASMA  URINALYSIS, ROUTINE W REFLEX MICROSCOPIC    EKG None  Radiology CT HEAD WO CONTRAST (5MM)  Result Date: 05/29/2021 CLINICAL DATA:  Head trauma EXAM: CT HEAD WITHOUT CONTRAST TECHNIQUE: Contiguous axial images were obtained from the base of the skull through the vertex without intravenous contrast. COMPARISON:  None. FINDINGS: Brain: No acute territorial infarction, hemorrhage, or intracranial mass. Moderate atrophy. Nonenlarged ventricles Vascular: No hyperdense vessels. Scattered carotid vascular calcification Skull: Normal. Negative for fracture or focal lesion. Sinuses/Orbits: No acute finding. Other: None IMPRESSION: 1. No CT evidence for acute intracranial abnormality. 2. Atrophy Electronically Signed   By: Donavan Foil M.D.   On: 05/29/2021 17:11   DG Chest Port 1 View  Result Date: 05/29/2021 CLINICAL DATA:  Positive for COVID and strep throat, shortness of breath EXAM: PORTABLE CHEST 1 VIEW COMPARISON:  11/20/2018 FINDINGS: Left chest pacemaker with leads in the right atrium  and right ventricle. Unchanged cardiac and mediastinal silhouettes. No focal consolidative opacity. No pleural effusion. No acute osseous abnormality. IMPRESSION: No acute cardiopulmonary process. Electronically Signed   By: Merilyn Baba MD   On: 05/29/2021 16:21     Procedures Procedures   Medications Ordered in ED Medications  0.9 %  sodium chloride infusion (1,000 mLs Intravenous Bolus 05/29/21 1627)  sodium chloride 0.9 % bolus 500 mL (has no administration in time range)  lidocaine (XYLOCAINE) 2 % viscous mouth solution 15 mL (15 mLs Mouth/Throat Given 05/29/21 1629)    ED Course  I have reviewed the triage vital signs and the nursing notes.  Pertinent labs & imaging results that were available during my care of the patient were reviewed by me and considered in my medical decision making (see chart for details).    MDM Rules/Calculators/A&P                           As pt is on Eliquis and fell hitting her head, a CT scan was done which showed nothing acute intracranially.  Labs show some mild AKI and anemia.  Pt is still extremely weak after fluids.  She would not drink much and was only able to take a few steps.    Pt d/w Dr. Denton Brick (triad) for admission.  Kristen Fox was evaluated in Emergency Department on 05/29/2021 for the symptoms described in the history of present illness. She was evaluated in the context of the global COVID-19 pandemic, which necessitated consideration that the patient might be at risk for infection with the SARS-CoV-2 virus that causes COVID-19. Institutional protocols and algorithms that pertain to the evaluation of patients at risk for COVID-19 are in a state of rapid change based on information released by regulatory bodies including the CDC and federal and state organizations. These policies and algorithms were followed during the patient's care in the ED.   Final Clinical Impression(s) / ED Diagnoses Final diagnoses:  Failure to thrive in adult  COVID-19  Dehydration    Rx / DC Orders ED Discharge Orders     None        Isla Pence, MD 05/29/21 1859

## 2021-05-29 NOTE — ED Triage Notes (Signed)
Pt to the ED positive for covid and strep throat on 05/28/21.  Pt is having a hard time taking her medication and has nausea.

## 2021-05-30 DIAGNOSIS — Z794 Long term (current) use of insulin: Secondary | ICD-10-CM | POA: Diagnosis not present

## 2021-05-30 DIAGNOSIS — R627 Adult failure to thrive: Secondary | ICD-10-CM | POA: Diagnosis not present

## 2021-05-30 DIAGNOSIS — I4819 Other persistent atrial fibrillation: Secondary | ICD-10-CM | POA: Diagnosis not present

## 2021-05-30 DIAGNOSIS — J45909 Unspecified asthma, uncomplicated: Secondary | ICD-10-CM | POA: Diagnosis not present

## 2021-05-30 DIAGNOSIS — I129 Hypertensive chronic kidney disease with stage 1 through stage 4 chronic kidney disease, or unspecified chronic kidney disease: Secondary | ICD-10-CM | POA: Diagnosis not present

## 2021-05-30 DIAGNOSIS — Z950 Presence of cardiac pacemaker: Secondary | ICD-10-CM | POA: Diagnosis not present

## 2021-05-30 DIAGNOSIS — Z85828 Personal history of other malignant neoplasm of skin: Secondary | ICD-10-CM | POA: Diagnosis not present

## 2021-05-30 DIAGNOSIS — Z8616 Personal history of COVID-19: Secondary | ICD-10-CM | POA: Diagnosis not present

## 2021-05-30 DIAGNOSIS — R531 Weakness: Secondary | ICD-10-CM | POA: Diagnosis present

## 2021-05-30 DIAGNOSIS — Z79899 Other long term (current) drug therapy: Secondary | ICD-10-CM | POA: Diagnosis not present

## 2021-05-30 DIAGNOSIS — Z7901 Long term (current) use of anticoagulants: Secondary | ICD-10-CM | POA: Diagnosis not present

## 2021-05-30 DIAGNOSIS — E1122 Type 2 diabetes mellitus with diabetic chronic kidney disease: Secondary | ICD-10-CM | POA: Diagnosis not present

## 2021-05-30 DIAGNOSIS — N184 Chronic kidney disease, stage 4 (severe): Secondary | ICD-10-CM | POA: Diagnosis not present

## 2021-05-30 DIAGNOSIS — Z853 Personal history of malignant neoplasm of breast: Secondary | ICD-10-CM | POA: Diagnosis not present

## 2021-05-30 DIAGNOSIS — E860 Dehydration: Secondary | ICD-10-CM | POA: Diagnosis not present

## 2021-05-30 DIAGNOSIS — U071 COVID-19: Secondary | ICD-10-CM | POA: Diagnosis not present

## 2021-05-30 LAB — COMPREHENSIVE METABOLIC PANEL
ALT: 17 U/L (ref 0–44)
AST: 26 U/L (ref 15–41)
Albumin: 3.3 g/dL — ABNORMAL LOW (ref 3.5–5.0)
Alkaline Phosphatase: 26 U/L — ABNORMAL LOW (ref 38–126)
Anion gap: 8 (ref 5–15)
BUN: 33 mg/dL — ABNORMAL HIGH (ref 8–23)
CO2: 23 mmol/L (ref 22–32)
Calcium: 8.6 mg/dL — ABNORMAL LOW (ref 8.9–10.3)
Chloride: 106 mmol/L (ref 98–111)
Creatinine, Ser: 1.48 mg/dL — ABNORMAL HIGH (ref 0.44–1.00)
GFR, Estimated: 35 mL/min — ABNORMAL LOW (ref >60)
Glucose, Bld: 102 mg/dL — ABNORMAL HIGH (ref 70–99)
Potassium: 3.8 mmol/L (ref 3.5–5.1)
Sodium: 137 mmol/L (ref 135–145)
Total Bilirubin: 0.4 mg/dL (ref 0.3–1.2)
Total Protein: 6.3 g/dL — ABNORMAL LOW (ref 6.5–8.1)

## 2021-05-30 LAB — D-DIMER, QUANTITATIVE: D-Dimer, Quant: 0.63 ug/mL-FEU — ABNORMAL HIGH (ref 0.00–0.50)

## 2021-05-30 LAB — CBC WITH DIFFERENTIAL/PLATELET
Abs Immature Granulocytes: 0.02 10*3/uL (ref 0.00–0.07)
Basophils Absolute: 0 10*3/uL (ref 0.0–0.1)
Basophils Relative: 0 %
Eosinophils Absolute: 0 10*3/uL (ref 0.0–0.5)
Eosinophils Relative: 0 %
HCT: 29.6 % — ABNORMAL LOW (ref 36.0–46.0)
Hemoglobin: 9.5 g/dL — ABNORMAL LOW (ref 12.0–15.0)
Immature Granulocytes: 0 %
Lymphocytes Relative: 29 %
Lymphs Abs: 1.8 10*3/uL (ref 0.7–4.0)
MCH: 27.9 pg (ref 26.0–34.0)
MCHC: 32.1 g/dL (ref 30.0–36.0)
MCV: 87.1 fL (ref 80.0–100.0)
Monocytes Absolute: 0.9 10*3/uL (ref 0.1–1.0)
Monocytes Relative: 15 %
Neutro Abs: 3.5 10*3/uL (ref 1.7–7.7)
Neutrophils Relative %: 56 %
Platelets: 145 10*3/uL — ABNORMAL LOW (ref 150–400)
RBC: 3.4 MIL/uL — ABNORMAL LOW (ref 3.87–5.11)
RDW: 15.3 % (ref 11.5–15.5)
WBC: 6.3 10*3/uL (ref 4.0–10.5)
nRBC: 0 % (ref 0.0–0.2)

## 2021-05-30 LAB — GLUCOSE, CAPILLARY
Glucose-Capillary: 115 mg/dL — ABNORMAL HIGH (ref 70–99)
Glucose-Capillary: 232 mg/dL — ABNORMAL HIGH (ref 70–99)
Glucose-Capillary: 123 mg/dL — ABNORMAL HIGH (ref 70–99)

## 2021-05-30 LAB — PHOSPHORUS: Phosphorus: 3.9 mg/dL (ref 2.5–4.6)

## 2021-05-30 LAB — C-REACTIVE PROTEIN: CRP: 7 mg/dL — ABNORMAL HIGH (ref <1.0)

## 2021-05-30 LAB — FERRITIN: Ferritin: 61 ng/mL (ref 11–307)

## 2021-05-30 LAB — MAGNESIUM: Magnesium: 2 mg/dL (ref 1.7–2.4)

## 2021-05-30 MED ORDER — AZITHROMYCIN 250 MG PO TABS
500.0000 mg | ORAL_TABLET | Freq: Once | ORAL | Status: AC
  Administered 2021-05-30: 500 mg via ORAL
  Filled 2021-05-30: qty 2

## 2021-05-30 MED ORDER — PREDNISONE 20 MG PO TABS: 40.0000 mg | ORAL_TABLET | Freq: Every day | ORAL | 0 refills | Status: AC

## 2021-05-30 MED ORDER — ASCORBIC ACID 500 MG PO TABS
500.0000 mg | ORAL_TABLET | Freq: Every day | ORAL | 3 refills | Status: DC
Start: 2021-05-31 — End: 2022-04-01

## 2021-05-30 MED ORDER — SODIUM CHLORIDE 0.9 % IV SOLN: INTRAVENOUS | Status: AC

## 2021-05-30 MED ORDER — ALBUTEROL SULFATE HFA 108 (90 BASE) MCG/ACT IN AERS: 2.0000 | INHALATION_SPRAY | Freq: Four times a day (QID) | RESPIRATORY_TRACT | 1 refills | Status: DC | PRN

## 2021-05-30 MED ORDER — REMDESIVIR 100 MG IV SOLR
100.0000 mg | INTRAVENOUS | Status: AC
  Administered 2021-05-30 (×2): 100 mg via INTRAVENOUS
  Filled 2021-05-30 (×2): qty 20

## 2021-05-30 MED ORDER — LISINOPRIL 40 MG PO TABS: 40.0000 mg | ORAL_TABLET | Freq: Every day | ORAL | 2 refills | Status: DC

## 2021-05-30 MED ORDER — ZINC SULFATE 220 (50 ZN) MG PO CAPS: 220.0000 mg | ORAL_CAPSULE | Freq: Every day | ORAL | 3 refills | Status: DC

## 2021-05-30 MED ORDER — PREDNISONE 20 MG PO TABS
50.0000 mg | ORAL_TABLET | Freq: Every day | ORAL | Status: DC
  Administered 2021-05-30: 50 mg via ORAL
  Filled 2021-05-30: qty 1
  Filled 2021-05-30: qty 2

## 2021-05-30 MED ORDER — HYDROCHLOROTHIAZIDE 12.5 MG PO TABS
12.5000 mg | ORAL_TABLET | Freq: Every day | ORAL | 1 refills | Status: DC
Start: 2021-06-05 — End: 2023-12-17

## 2021-05-30 MED ORDER — CEPHALEXIN 500 MG PO CAPS
500.0000 mg | ORAL_CAPSULE | Freq: Two times a day (BID) | ORAL | Status: DC
  Administered 2021-05-30: 500 mg via ORAL
  Filled 2021-05-30: qty 1

## 2021-05-30 MED ORDER — SODIUM CHLORIDE 0.9 % IV SOLN: 100.0000 mg | Freq: Every day | INTRAVENOUS | Status: DC

## 2021-05-30 MED ORDER — GUAIFENESIN-DM 100-10 MG/5ML PO SYRP: 10.0000 mL | ORAL_SOLUTION | Freq: Three times a day (TID) | ORAL | 0 refills | Status: DC

## 2021-05-30 MED ORDER — AZITHROMYCIN 500 MG PO TABS: 500.0000 mg | ORAL_TABLET | Freq: Every day | ORAL | 0 refills | Status: DC

## 2021-05-30 MED ORDER — MENTHOL 3 MG MT LOZG
1.0000 | LOZENGE | Freq: Three times a day (TID) | OROMUCOSAL | Status: DC | PRN
  Administered 2021-05-30: 3 mg via ORAL
  Filled 2021-05-30: qty 9

## 2021-05-30 MED ORDER — BENZOCAINE-MENTHOL 6-10 MG MT LOZG: 1.0000 | LOZENGE | OROMUCOSAL | 0 refills | Status: DC | PRN

## 2021-05-30 MED ORDER — CEPHALEXIN 500 MG PO CAPS: 500.0000 mg | ORAL_CAPSULE | Freq: Three times a day (TID) | ORAL | Status: DC

## 2021-05-30 NOTE — Plan of Care (Signed)
  Problem: Acute Rehab PT Goals(only PT should resolve) Goal: Patient Will Transfer Sit To/From Stand Outcome: Progressing Flowsheets (Taken 05/30/2021 1137) Patient will transfer sit to/from stand: with modified independence Goal: Pt Will Ambulate Outcome: Progressing Flowsheets (Taken 05/30/2021 1137) Pt will Ambulate:  > 125 feet  with supervision  with rolling walker   Tori Carmesha Morocco PT, DPT 05/30/21, 11:37 AM

## 2021-05-30 NOTE — Discharge Summary (Signed)
Kristen Fox, is a 87 y.o. female  DOB 1934-01-11  MRN [id number].  Admission date:  05/29/2021  Admitting Physician  Bethena Roys, MD  Discharge Date:  05/30/2021   Primary MD  Josem Kaufmann, MD  Recommendations for primary care physician for things to follow:   1) You are strongly advised to isolate/quarantine for at least 10 days from the date of your diagnosis with COVID-19 infection--please always wear a mask if you have to go outside the house  2)You advised to get [place] or [place]  Covid Vaccine in about  3 months from now to reduce your chance of getting severe Covid 19 reinfection   3)Please take medications as prescribed  4)Video/Virtual follow-up visit with primary care physician in about a week advised  5)Repeat BMP(kidney and electrolyte test) in 1 to 2 weeks with the primary care physician advised  6)Avoid ibuprofen/Advil/Aleve/Motrin/Goody Powders/Naproxen/BC powders/Meloxicam/Diclofenac/Indomethacin and other Nonsteroidal anti-inflammatory medications as these will make you more likely to bleed and can cause stomach ulcers, can also cause Kidney problems.   7) okay to restart  azithromycin on 05/31/2021  8) if you decide to complete Paxlovid treatment --- you will need to hold Eliquis/apixaban while taking Paxlovid  9)Please Stop HCTZ and Farxiga for now due to concerns about Dehydration  10)May restart HCTZ and Lisinopril about a week from now after your dehydration problems resolves  11) please check your blood sugars 3-4 times a day while taking prednisone   Admission Diagnosis  Dehydration [E86.0] Failure to thrive in adult [R62.7] Generalized weakness [R53.1] COVID-19 [U07.1]   Discharge Diagnosis  Dehydration [E86.0] Failure to thrive in adult [R62.7] Generalized weakness [R53.1] COVID-19 [U07.1]    Principal Problem:   Generalized weakness Active  Problems:   Complete heart block (HCC)   Hypertension   Diabetes mellitus ([place])   History of pulmonary embolism   Persistent atrial fibrillation ([place])   COVID-19 virus infection   Falls      Past Medical History:  Diagnosis Date   Asthma due to environmental allergies    Basal cell carcinoma 05/08/2020   bcc ant. mid neck    Breast cancer ([place])    2003   Cataract    Chronic kidney disease    Clotting disorder ([place])    Diabetes mellitus ([place])    type II   Diabetic coma ([place])    GERD (gastroesophageal reflux disease)    Glaucoma    Hyperlipidemia    Hypertension    Osteoporosis    Pneumonia    Pulmonary embolism ([place])    Squamous cell carcinoma of skin 10/18/2014   well diff-right upper arm cx3 27fu   Squamous cell carcinoma of skin 06/02/2019   in situ-upper lip (txpbx)   Tubular adenoma of colon 2017    Past Surgical History:  Procedure Laterality Date   APPENDECTOMY     CATARACT EXTRACTION, BILATERAL     MASTECTOMY     left w/ymph node removal   PACEMAKER IMPLANT N/A 11/19/2020   Procedure:  PACEMAKER IMPLANT;  Surgeon: Deboraha Sprang, MD;  Location: [facility];  Service: Cardiovascular;  Laterality: N/A;   SKIN CANCER EXCISION     TONSILLECTOMY     VAGINAL HYSTERECTOMY  1981    HPI  from the history and physical done on the day of admission:       Chief Complaint: Weakness, falls, COVID-positive.   HPI: Kristen Fox is a 87 y.o. female with medical history significant for hypertension, diabetes mellitus, pulmonary embolism, CKD 4, atrial fibrillation, complete heart block. Patient was brought to the ED reports of generalized weakness, productive cough, sore throat limiting oral intake started 2 days ago- 8/8.  Daughter present at bedside.  Patient has had to 2 falls since onset of symptoms, sustaining a bruise to the left side of her upper lip.  She has had dry heaves, no vomiting no loose stools. Patient is vaccinated x2 and has received 2 booster  doses.  Daughter reports patient had COVID during the start of the pandemic, with pneumonia and blood clots. Patient tested at home and was negative for COVID 8/8, but presented to an urgent care 8/9, was swabbed and tested positive and was prescribed Paxlovid and azithromycin.  She has taken 3 doses of Paxlovid and has taken 2 of the prescribed 5-day course of azithromycin.  She is also on Eliquis and Crestor..   ED Course: Heart pressure systolic 19-4 17E.  Temperature 99.  O2 sats greater than 96% on room air.  Chest x-ray without acute abnormality.  Head CT will also.  Mild increase in some inflammatory markers CRP 3.5, fibrinogen.  Lactic acid 1.7. Hospitalist to admit for generalized weakness, falls.   Review of Systems: As per HPI all other systems reviewed and negative.    Hospital Course:        1) COVID-19 infection----patient is vaccinated and boosted x 2 -No hypoxia or significant respiratory symptoms -She took Paxlovid for 2 days PTA (she did not hold Eliquis while taking Paxlovid -Received IV remdesivir 200 mg x 1 on 05/30/2021 - if patient decides to complete a full 5 days of Paxlovid (3 additional days)  She will have to stay off Eliquis and azithromycin as well as Crestor while on Paxlovid  - 2) strep throat--- okay to complete azithromycin for strep throat as prescribed -Prednisone given for throat swelling and discomfort --OTC Cepacol as needed  3)AKI on CKD IV--- due to dehydration in the setting of strep throat and COVID infection with poor oral intake -Patient received IV fluids creatinine down to 1.4 from 1.8 -Avoid NSAIDs -Hold lisinopril and HCTZ for at least a week on 2 dehydration and poor oral intake issues related to strep throat and COVID-19 infection resolved  4) history of chronic A. fib/CHB----status post pacemaker placement -Eliquis for stroke prophylaxis and Coreg and Cardizem for rate control  5)H/o Prior DVT--- Eliquis as above #4  --Eliquis  dose has been adjusted for age and renal function  6)HTN--hold lisinopril and HCTZ as above #3, okay to continue Cardizem and Coreg as above #4  7) generalized weakness/fall--- secondary to dehydration in the setting of COVID infection and strep throat -PT eval appreciated recommends home health PT  8)DM2-recent A1c 7.6 reflecting uncontrolled DM with hyperglycemia -Patient advised to monitor glucose closely given prednisone use -Okay to hold Farxiga at this time due to concerns for dehydration -Otherwise continue PTA diabetic meds  Discharge Condition: Stable,  Follow UP--- PCP as advised  Diet and Activity recommendation:  As advised  Discharge Instructions    Discharge Instructions     Call MD for:  difficulty breathing, headache or visual disturbances   Complete by: As directed    Call MD for:  persistant dizziness or light-headedness   Complete by: As directed    Call MD for:  persistant nausea and vomiting   Complete by: As directed    Call MD for:  temperature >100.4   Complete by: As directed    Diet - low sodium heart healthy   Complete by: As directed    Diet Carb Modified   Complete by: As directed    Discharge instructions   Complete by: As directed    1) You are strongly advised to isolate/quarantine for at least 10 days from the date of your diagnosis with COVID-19 infection--please always wear a mask if you have to go outside the house  2)You advised to get [place] or [place]  Covid Vaccine in about  3 months from now to reduce your chance of getting severe Covid 19 reinfection   3)Please take medications as prescribed  4)Video/Virtual follow-up visit with primary care physician in about a week advised  5)Repeat BMP(kidney and electrolyte test) in 1 to 2 weeks with the primary care physician advised  6)Avoid ibuprofen/Advil/Aleve/Motrin/Goody Powders/Naproxen/BC powders/Meloxicam/Diclofenac/Indomethacin and other Nonsteroidal anti-inflammatory medications  as these will make you more likely to bleed and can cause stomach ulcers, can also cause Kidney problems.   7) okay to restart  azithromycin on 05/31/2021  8) if you decide to complete Paxlovid treatment --- you will need to hold Eliquis/apixaban while taking Paxlovid  9)Please Stop HCTZ and Farxiga for now due to concerns about Dehydration  10)May restart HCTZ and Lisinopril about a week from now after your dehydration problems resolves  11) please check your blood sugars 3-4 times a day while taking prednisone   Increase activity slowly   Complete by: As directed          Discharge Medications     Allergies as of 05/30/2021       Reactions   Metformin Other (See Comments)   Caused stomach upset (tolerates timed release)        Medication List     STOP taking these medications    Farxiga 10 MG Tabs tablet Generic drug: dapagliflozin propanediol       TAKE these medications    albuterol 108 (90 Base) MCG/ACT inhaler Commonly known as: VENTOLIN HFA Inhale 2 puffs into the lungs every 6 (six) hours as needed for wheezing or shortness of breath (cough).   alclomethasone 0.05 % cream Commonly known as: ACLOVATE Apply topically 2 (two) times daily as needed (Rash).   apixaban 2.5 MG Tabs tablet Commonly known as: ELIQUIS Take 1 tablet (2.5 mg total) by mouth 2 (two) times daily.   ascorbic acid 500 MG tablet Commonly known as: VITAMIN C Take 1 tablet (500 mg total) by mouth daily. Start taking on: May 31, 2021   azithromycin 500 MG tablet Commonly known as: ZITHROMAX Take 1 tablet (500 mg total) by mouth daily. Start taking on: May 31, 2021 What changed:  medication strength how much to take when to take this   benzocaine-menthol 6-10 MG lozenge Commonly known as: CHLORAEPTIC Take 1 lozenge by mouth as needed for sore throat.   brimonidine 0.2 % ophthalmic solution Commonly known as: ALPHAGAN Place 1 drop into both eyes 3 (three) times  daily.   CALCIUM 600-D PO Take 1 tablet by  mouth 2 (two) times daily.   carvedilol 25 MG tablet Commonly known as: COREG Take 1 tablet (25 mg total) by mouth 2 (two) times daily.   denosumab 60 MG/ML Soln injection Commonly known as: PROLIA Inject 60 mg into the skin every 6 (six) months. Administer in upper arm, thigh, or abdomen   diltiazem 120 MG 24 hr capsule Commonly known as: Cardizem CD Take 1 capsule (120 mg total) by mouth daily.   dorzolamide-timolol 22.3-6.8 MG/ML ophthalmic solution Commonly known as: COSOPT Place 1 drop into both eyes 2 (two) times daily.   esomeprazole 20 MG capsule Commonly known as: NEXIUM Take 20 mg by mouth daily at 12 noon.   gabapentin 100 MG capsule Commonly known as: NEURONTIN Take 1 capsule (100 mg total) by mouth 2 (two) times daily.   guaiFENesin-dextromethorphan 100-10 MG/5ML syrup Commonly known as: ROBITUSSIN DM Take 10 mLs by mouth every 8 (eight) hours.   hydrochlorothiazide 12.5 MG tablet Commonly known as: HYDRODIURIL Take 1 tablet (12.5 mg total) by mouth daily. Start taking on: June 05, 2021 What changed:  medication strength how much to take These instructions start on June 05, 2021. If you are unsure what to do until then, ask your doctor or other care provider.   insulin lispro 100 UNIT/ML KwikPen Commonly known as: HUMALOG Inject 10 Units into the skin daily with lunch.   latanoprost 0.005 % ophthalmic solution Commonly known as: XALATAN Place 1 drop into both eyes at bedtime.   lisinopril 40 MG tablet Commonly known as: ZESTRIL Take 1 tablet (40 mg total) by mouth daily. Start taking on: June 05, 2021 What changed: These instructions start on June 05, 2021. If you are unsure what to do until then, ask your doctor or other care provider.   metFORMIN 500 MG 24 hr tablet Commonly known as: GLUCOPHAGE-XR Take 1,000 mg by mouth 2 (two) times daily.   montelukast 10 MG tablet Commonly known as:  SINGULAIR Take 10 mg by mouth at bedtime.   Paxlovid 20 x 150 MG & 10 x 100MG  Tbpk Generic drug: Nirmatrelvir & Ritonavir Take 2 tablets by mouth in the morning and at bedtime. Starting 8.9.22   predniSONE 20 MG tablet Commonly known as: DELTASONE Take 2 tablets (40 mg total) by mouth daily with breakfast for 5 days.   rosuvastatin 40 MG tablet Commonly known as: CRESTOR Take 40 mg by mouth at bedtime.   Tyler Aas FlexTouch 100 UNIT/ML FlexTouch Pen Generic drug: insulin degludec Inject 10-25 Units into the skin at bedtime. Based on CBG   vitamin B-12 500 MCG tablet Commonly known as: CYANOCOBALAMIN Take 1,000 mcg by mouth daily.   VITAMIN D3 PO Take 1 tablet by mouth 2 (two) times daily.   zinc sulfate 220 (50 Zn) MG capsule Take 1 capsule (220 mg total) by mouth daily. Start taking on: May 31, 2021        Major procedures and Radiology Reports - PLEASE review detailed and final reports for all details, in brief -   CT HEAD WO CONTRAST (5MM)  Result Date: 05/29/2021 CLINICAL DATA:  Head trauma EXAM: CT HEAD WITHOUT CONTRAST TECHNIQUE: Contiguous axial images were obtained from the base of the skull through the vertex without intravenous contrast. COMPARISON:  None. FINDINGS: Brain: No acute territorial infarction, hemorrhage, or intracranial mass. Moderate atrophy. Nonenlarged ventricles Vascular: No hyperdense vessels. Scattered carotid vascular calcification Skull: Normal. Negative for fracture or focal lesion. Sinuses/Orbits: No acute finding. Other: None IMPRESSION: 1. No CT  evidence for acute intracranial abnormality. 2. Atrophy Electronically Signed   By: Donavan Foil M.D.   On: 05/29/2021 17:11   DG Chest Port 1 View  Result Date: 05/29/2021 CLINICAL DATA:  Positive for COVID and strep throat, shortness of breath EXAM: PORTABLE CHEST 1 VIEW COMPARISON:  11/20/2018 FINDINGS: Left chest pacemaker with leads in the right atrium and right ventricle. Unchanged cardiac  and mediastinal silhouettes. No focal consolidative opacity. No pleural effusion. No acute osseous abnormality. IMPRESSION: No acute cardiopulmonary process. Electronically Signed   By: Merilyn Baba MD   On: 05/29/2021 16:21   CUP PACEART REMOTE DEVICE CHECK  Result Date: 05/21/2021 Scheduled remote reviewed. Normal device function.  Next remote 91 days- JBox, RN/CVRS   Micro Results   Recent Results (from the past 240 hour(s))  Resp Panel by RT-PCR (Flu A&B, Covid) Nasopharyngeal Swab     Status: Abnormal   Collection Time: 05/29/21  4:21 PM   Specimen: Nasopharyngeal Swab; Nasopharyngeal(NP) swabs in vial transport medium  Result Value Ref Range Status   SARS Coronavirus 2 by RT PCR POSITIVE (A) NEGATIVE Final    Comment: CRITICAL RESULT CALLED TO, READ BACK BY AND VERIFIED WITH: CRAWFORD,H AT 1740 ON 8.10.22 BY RUCINSKI,B (NOTE) SARS-CoV-2 target nucleic acids are DETECTED.  The SARS-CoV-2 RNA is generally detectable in upper respiratory specimens during the acute phase of infection. Positive results are indicative of the presence of the identified virus, but do not rule out bacterial infection or co-infection with other pathogens not detected by the test. Clinical correlation with patient history and other diagnostic information is necessary to determine patient infection status. The expected result is Negative.  Fact Sheet for Patients: EntrepreneurPulse.com.au  Fact Sheet for Healthcare Providers: IncredibleEmployment.be  This test is not yet approved or cleared by the [place] FDA and  has been authorized for detection and/or diagnosis of SARS-CoV-2 by FDA under an Emergency Use Authorization (EUA).  This EUA will remain in effect (meani ng this test can be used) for the duration of  the COVID-19 declaration under Section 564(b)(1) of the Act, 21 U.S.C. section 360bbb-3(b)(1), unless the authorization is terminated or revoked  sooner.     Influenza A by PCR NEGATIVE NEGATIVE Final   Influenza B by PCR NEGATIVE NEGATIVE Final    Comment: (NOTE) The Xpert Xpress SARS-CoV-2/FLU/RSV plus assay is intended as an aid in the diagnosis of influenza from Nasopharyngeal swab specimens and should not be used as a sole basis for treatment. Nasal washings and aspirates are unacceptable for Xpert Xpress SARS-CoV-2/FLU/RSV testing.  Fact Sheet for Patients: EntrepreneurPulse.com.au  Fact Sheet for Healthcare Providers: IncredibleEmployment.be  This test is not yet approved or cleared by the [place] FDA and has been authorized for detection and/or diagnosis of SARS-CoV-2 by FDA under an Emergency Use Authorization (EUA). This EUA will remain in effect (meaning this test can be used) for the duration of the COVID-19 declaration under Section 564(b)(1) of the Act, 21 U.S.C. section 360bbb-3(b)(1), unless the authorization is terminated or revoked.  Performed at [facility], [address], [place], [place] [address]   Blood Culture (routine x 2)     Status: None (Preliminary result)   Collection Time: 05/29/21  4:21 PM   Specimen: BLOOD LEFT HAND  Result Value Ref Range Status   Specimen Description BLOOD LEFT HAND  Final   Special Requests   Final    BOTTLES DRAWN AEROBIC AND ANAEROBIC Blood Culture adequate volume Performed at [facility],  [address], [place], [place] [address]    Culture PENDING  Incomplete   Report Status PENDING  Incomplete  Blood Culture (routine x 2)     Status: None (Preliminary result)   Collection Time: 05/29/21  4:21 PM   Specimen: Right Antecubital; Blood  Result Value Ref Range Status   Specimen Description RIGHT ANTECUBITAL  Final   Special Requests   Final    BOTTLES DRAWN AEROBIC AND ANAEROBIC Blood Culture results may not be optimal due to an excessive volume of blood received in culture bottles Performed at [facility], [address] [place], [place] [address]    Culture PENDING  Incomplete   Report Status PENDING  Incomplete     Today   Subjective    [place] today has no new complaints        No fever  Or chills   No Nausea, Vomiting or Diarrhea -Plan of care discussed with patient's daughter at bedside, questions answered   Patient has been seen and examined prior to discharge   Objective   Blood pressure (!) 124/42, pulse (!) 58, temperature 98.3 F (36.8 C), temperature source Oral, resp. rate 18, height 5\' 5"  (1.651 m), weight 73.6 kg, SpO2 100 %.   Intake/Output Summary (Last 24 hours) at 05/30/2021 1559 Last data filed at 05/30/2021 1532 Gross per 24 hour  Intake 1863.4 ml  Output --  Net 1863.4 ml    Exam Gen:- Awake Alert, no acute distress  HEENT:- [place].AT, No sclera icterus Neck-Supple Neck,No JVD,.  Lungs-  CTAB , good air movement bilaterally  CV- S1, S2 normal, regular, pacemaker in situ Abd-  +ve B.Sounds, Abd Soft, No tenderness,    Extremity/Skin:- No  edema,   good pulses Psych-affect is appropriate, oriented x3 Neuro-generalized weakness, no new focal deficits, no tremors    Data Review   CBC w Diff:  Lab Results  Component Value Date   WBC 6.3 05/30/2021   HGB 9.5 (L) 05/30/2021   HCT 29.6 (L) 05/30/2021   PLT 145 (L) 05/30/2021   LYMPHOPCT 29 05/30/2021   MONOPCT 15 05/30/2021   EOSPCT 0 05/30/2021   BASOPCT 0 05/30/2021   CMP:  Lab Results  Component Value Date   NA 137 05/30/2021   NA 140 12/14/2020   K 3.8 05/30/2021   CL 106 05/30/2021   CO2 23 05/30/2021   BUN 33 (H) 05/30/2021   BUN 24 12/14/2020   CREATININE 1.48 (H) 05/30/2021   PROT 6.3 (L) 05/30/2021   ALBUMIN 3.3 (L) 05/30/2021   BILITOT 0.4 05/30/2021   ALKPHOS 26 (L) 05/30/2021   AST 26 05/30/2021   ALT 17 05/30/2021    Total Discharge time is about 33 minutes  Roxan Hockey M.D on 05/30/2021 at 3:59 PM  Go to [email or web address] -  for contact info  [facility] - Office   [phone]

## 2021-05-30 NOTE — Progress Notes (Signed)
Nsg Discharge Note  Admit Date:  05/29/2021 Discharge date: 05/30/2021   Patria Mane to be D/C'd Home per MD order.  AVS completed.  Copy for chart, and copy for patient signed, and dated. Patient/caregiver able to verbalize understanding.  Discharge Medication: Allergies as of 05/30/2021       Reactions   Metformin Other (See Comments)   Caused stomach upset (tolerates timed release)        Medication List     STOP taking these medications    Farxiga 10 MG Tabs tablet Generic drug: dapagliflozin propanediol       TAKE these medications    albuterol 108 (90 Base) MCG/ACT inhaler Commonly known as: VENTOLIN HFA Inhale 2 puffs into the lungs every 6 (six) hours as needed for wheezing or shortness of breath (cough).   alclomethasone 0.05 % cream Commonly known as: ACLOVATE Apply topically 2 (two) times daily as needed (Rash).   apixaban 2.5 MG Tabs tablet Commonly known as: ELIQUIS Take 1 tablet (2.5 mg total) by mouth 2 (two) times daily.   ascorbic acid 500 MG tablet Commonly known as: VITAMIN C Take 1 tablet (500 mg total) by mouth daily. Start taking on: May 31, 2021   azithromycin 500 MG tablet Commonly known as: ZITHROMAX Take 1 tablet (500 mg total) by mouth daily. Start taking on: May 31, 2021 What changed:  medication strength how much to take when to take this   benzocaine-menthol 6-10 MG lozenge Commonly known as: CHLORAEPTIC Take 1 lozenge by mouth as needed for sore throat.   brimonidine 0.2 % ophthalmic solution Commonly known as: ALPHAGAN Place 1 drop into both eyes 3 (three) times daily.   CALCIUM 600-D PO Take 1 tablet by mouth 2 (two) times daily.   carvedilol 25 MG tablet Commonly known as: COREG Take 1 tablet (25 mg total) by mouth 2 (two) times daily.   denosumab 60 MG/ML Soln injection Commonly known as: PROLIA Inject 60 mg into the skin every 6 (six) months. Administer in upper arm, thigh, or abdomen   diltiazem 120  MG 24 hr capsule Commonly known as: Cardizem CD Take 1 capsule (120 mg total) by mouth daily.   dorzolamide-timolol 22.3-6.8 MG/ML ophthalmic solution Commonly known as: COSOPT Place 1 drop into both eyes 2 (two) times daily.   esomeprazole 20 MG capsule Commonly known as: NEXIUM Take 20 mg by mouth daily at 12 noon.   gabapentin 100 MG capsule Commonly known as: NEURONTIN Take 1 capsule (100 mg total) by mouth 2 (two) times daily.   guaiFENesin-dextromethorphan 100-10 MG/5ML syrup Commonly known as: ROBITUSSIN DM Take 10 mLs by mouth every 8 (eight) hours.   hydrochlorothiazide 12.5 MG tablet Commonly known as: HYDRODIURIL Take 1 tablet (12.5 mg total) by mouth daily. Start taking on: June 05, 2021 What changed:  medication strength how much to take These instructions start on June 05, 2021. If you are unsure what to do until then, ask your doctor or other care provider.   insulin lispro 100 UNIT/ML KwikPen Commonly known as: HUMALOG Inject 10 Units into the skin daily with lunch.   latanoprost 0.005 % ophthalmic solution Commonly known as: XALATAN Place 1 drop into both eyes at bedtime.   lisinopril 40 MG tablet Commonly known as: ZESTRIL Take 1 tablet (40 mg total) by mouth daily. Start taking on: June 05, 2021 What changed: These instructions start on June 05, 2021. If you are unsure what to do until then, ask your doctor or  other care provider.   metFORMIN 500 MG 24 hr tablet Commonly known as: GLUCOPHAGE-XR Take 1,000 mg by mouth 2 (two) times daily.   montelukast 10 MG tablet Commonly known as: SINGULAIR Take 10 mg by mouth at bedtime.   Paxlovid 20 x 150 MG & 10 x 100MG  Tbpk Generic drug: Nirmatrelvir & Ritonavir Take 2 tablets by mouth in the morning and at bedtime. Starting 8.9.22   predniSONE 20 MG tablet Commonly known as: DELTASONE Take 2 tablets (40 mg total) by mouth daily with breakfast for 5 days.   rosuvastatin 40 MG  tablet Commonly known as: CRESTOR Take 40 mg by mouth at bedtime.   Tyler Aas FlexTouch 100 UNIT/ML FlexTouch Pen Generic drug: insulin degludec Inject 10-25 Units into the skin at bedtime. Based on CBG   vitamin B-12 500 MCG tablet Commonly known as: CYANOCOBALAMIN Take 1,000 mcg by mouth daily.   VITAMIN D3 PO Take 1 tablet by mouth 2 (two) times daily.   zinc sulfate 220 (50 Zn) MG capsule Take 1 capsule (220 mg total) by mouth daily. Start taking on: May 31, 2021        Discharge Assessment: Vitals:   05/30/21 0957 05/30/21 1421  BP: (!) 144/40 (!) 124/42  Pulse: 60 (!) 58  Resp:  18  Temp: 99.2 F (37.3 C) 98.3 F (36.8 C)  SpO2: 98% 100%   Skin clean, dry and intact without evidence of skin break down, no evidence of skin tears noted. IV catheter discontinued intact. Site without signs and symptoms of complications - no redness or edema noted at insertion site, patient denies c/o pain - only slight tenderness at site.  Dressing with slight pressure applied.  D/c Instructions-Education: Discharge instructions given to patient/family with verbalized understanding. D/c education completed with patient/family including follow up instructions, medication list, d/c activities limitations if indicated, with other d/c instructions as indicated by MD - patient able to verbalize understanding, all questions fully answered. Patient instructed to return to ED, call 911, or call MD for any changes in condition.  Patient escorted via [place], and D/C home via private auto.  Zachery Conch, RN 05/30/2021 4:50 PM

## 2021-05-30 NOTE — Discharge Instructions (Signed)
1) You are strongly advised to isolate/quarantine for at least 10 days from the date of your diagnosis with COVID-19 infection--please always wear a mask if you have to go outside the house  2)You advised to get [place] or [place]  Covid Vaccine in about  3 months from now to reduce your chance of getting severe Covid 19 reinfection   3)Please take medications as prescribed  4)Video/Virtual follow-up visit with primary care physician in about a week advised  5)Repeat BMP(kidney and electrolyte test) in 1 to 2 weeks with the primary care physician advised  6)Avoid ibuprofen/Advil/Aleve/Motrin/Goody Powders/Naproxen/BC powders/Meloxicam/Diclofenac/Indomethacin and other Nonsteroidal anti-inflammatory medications as these will make you more likely to bleed and can cause stomach ulcers, can also cause Kidney problems.   7) okay to restart  azithromycin on 05/31/2021  8) if you decide to complete Paxlovid treatment --- you will need to hold Eliquis/apixaban while taking Paxlovid  9)Please Stop HCTZ and Farxiga for now due to concerns about Dehydration  10)May restart HCTZ and Lisinopril about a week from now after your dehydration problems resolves  11) please check your blood sugars 3-4 times a day while taking prednisone

## 2021-05-30 NOTE — Clinical Social Work Note (Signed)
Patient in observation, COVID+, recommended for HHPT. Referred to [facility] as patient lives in [place].    Nikodem Leadbetter, Clydene Pugh, LCSW

## 2021-05-30 NOTE — Clinical Social Work Note (Signed)
Patient Information  Patient Name  Kristen Fox, Kristen Fox ([id number]) Legal Sex  Female DOB  [birth year]  Room Bed  A332 A332-01    Patient Demographics  Address  [address]  [place] [address] Phone  [phone] (Home)  [phone] [facility]) E-mail Address  [email or web address]    Basic Information  Date Of Birth  1933-12-22 Gender Identity  Female Race  White or Caucasian Ethnic Group  Not Hispanic or Latino Preferred Language  English    Patient Contacts  Name Relation Home Work Mobile  Kristen Fox Kristen Fox [phone]      Documents on File   Status Date Received Description  Documents for the Patient  [place] OF PRIVACY - Scanned Not Received    [place] E-Signature HIPAA Notice of Privacy     Driver's License Not Received    Insurance Card Received 06/12/16 MEDICARE/AARP  Advance Directives/Living Will/HCPOA/POA Not Received    Other Photo ID Not Received    Release of Information Received 06/13/16 [facility] DPR SIGNED 06/12/16- OK TO TALK TODAUGHTER- Kristen Fox  AMB Correspondence  03/06/16 OFFICE NOTE [facility] MD, Fox  AMB Correspondence  07/08/16 01/13-06/15 OFFICE NOTE [facility]  AMB Correspondence Received 06/24/19 RECALL ASSESSMENT [facility]  Insurance Card Received 07/21/19 MEDICARE/AARP  AMB Outside Hospital Record Received 07/22/19 [facility] MD, [place] AND  [facility]  Insurance Card Received (Expired) 05/08/20 Medicare   AARP   05/08/2020  [place] E-Signature HIPAA Notice of Privacy Signed 05/08/20   Release of Information Received 05/08/20 DPR   05/08/2020  Insurance Card Received 11/17/20 medicare 11/17/2020  AMB Correspondence Received 04/26/21 LETTER/ATTACH  Patient Photo   Photo of Patient  Documents for the Encounter  AOB  (Assignment of Insurance Benefits) Received 05/29/21 Verbal Consent  E-signature AOB     MEDICARE RIGHTS Not Received    E-signature Medicare Rights      Annual Exam - E-Signature PCP     Waiver of MSE E-sig Signed 05/29/21   ED Patient Billing Extract   ED PB Billing Extract  After Visit Summary   IP After Visit Summary  EKG Received 05/30/21     Admission Information   Current Information  Attending Provider Admitting Provider Admission Type Admission Status  Kristen Hockey, MD Kristen Roys, MD Emergency Confirmed Admission        Admission Date/Time Discharge Date Hospital Service Auth/Cert Status  88/50/27  03:34 PM  [place] [facility] Room/Bed   [facility] AP-DEPT 300 A332/A332-01             Admission  Complaint  Covid Positive; [facility] Account  Name Acct ID Class Status Primary Coverage  Kristen Fox [id number] Observation Open MEDICARE - MEDICARE PART A AND B        Guarantor Account (for Hospital Account [id number])  Name Relation to Pt Service Area Active? Acct Type  Kristen Fox Self [facility]SA Yes Personal/Family  Address Phone    [address] [place] [address] [phone])          Coverage Information (for Hospital Account [id number])   1. [place] PART A AND B  F/O Payor/Plan Precert #  MEDICARE/MEDICARE PART A AND B   Subscriber Subscriber #  Kristen Fox [id number]  Address Phone  [address] [place], [place] [address]    2. AARP/AARP  F/O Payor/Plan Precert #  AARP/AARP   Subscriber Subscriber #  Kristen Fox [id number]  Address Phone  [address]  [place] [address] [phone]          Care Everywhere ID:  [id number]

## 2021-05-30 NOTE — Evaluation (Signed)
Physical Therapy Evaluation Patient Details Name: Kristen Fox MRN: [id number] DOB: 1934-01-03 Today's Date: 05/30/2021   History of Present Illness  Shelma Ackroyd is an 87 yo female positive for covid19 and strep. PMH: breast CA, CKD, diabetes, HTN, OP, PE, basal cell carcinoma, squamous cell carcinoma, pacemaker implant 11/19/20  Clinical Impression  Pt admitted with above diagnosis. Pt independent at baseline, has daughter ~2 miles away who is able to stay with her 24/7 at discharge while she recovers. Pt currently requiring supv for safety with transfers and ambulation using RW to steady. No SOB or LOB noted with mobility. Pt agreeable to HHPT once home to regain strength, endurance and activity tolerance while progressing away from RW. Pt currently with functional limitations due to the deficits listed below (see PT Problem List). Pt will benefit from skilled PT to increase their independence and safety with mobility to allow discharge to the venue listed below.       Follow Up Recommendations Home health PT    Equipment Recommendations  None recommended by PT    Recommendations for Other Services       Precautions / Restrictions Precautions Precautions: Fall Restrictions Weight Bearing Restrictions: No      Mobility  Bed Mobility Overal bed mobility: Modified Independent     Transfers Overall transfer level: Needs assistance Equipment used: Rolling walker (2 wheeled);None Transfers: Sit to/from Stand Sit to Stand: Supervision    General transfer comment: BUE assisting to power to stand, supv for safety, able to perform without DME but reports improved confidence with RW  Ambulation/Gait Ambulation/Gait assistance: Supervision Gait Distance (Feet): 20 Feet (x2) Assistive device: Rolling walker (2 wheeled) Gait Pattern/deviations: Step-through pattern;Decreased stride length Gait velocity: decreased   General Gait Details: slow, step through pattern in room with  RW, able to clear past obstacles safely, limited to room due to covid+, no LOB or SOB noted  Stairs            Wheelchair Mobility    Modified Rankin (Stroke Patients Only)       Balance Overall balance assessment: Mild deficits observed, not formally tested        Pertinent Vitals/Pain Pain Assessment: Faces Faces Pain Scale: Hurts little more Pain Location: throat Pain Descriptors / Indicators: Sore Pain Intervention(s): Limited activity within patient's tolerance;Monitored during session    Home Living Family/patient expects to be discharged to:: Private residence Living Arrangements: Alone Available Help at Discharge: Family;Available 24 hours/day Type of Home: House Home Access: Ramped entrance     Home Layout: Two level;Laundry or work area in [place]: Environmental consultant - 2 wheels Additional Comments: Daughter reports ability to stay with pt while recovering    Prior Function Level of Independence: Independent         Comments: Pt reports independent with ADLs/IADLs, drives, community ambulator without DME, cuts lawn. Pt has RW that she uses if she feels "weak" in the house.     Hand Dominance   Dominant Hand: Right    Extremity/Trunk Assessment   Upper Extremity Assessment Upper Extremity Assessment: Overall WFL for tasks assessed    Lower Extremity Assessment Lower Extremity Assessment: Overall WFL for tasks assessed (AROM WNL, strength 4/5 throughout, denies numbness/tingling)    Cervical / Trunk Assessment Cervical / Trunk Assessment: Normal  Communication   Communication: No difficulties  Cognition Arousal/Alertness: Awake/alert Behavior During Therapy: WFL for tasks assessed/performed;Flat affect Overall Cognitive Status: Within Functional Limits for tasks assessed  General Comments: Pt flat throughout, daughter  assisting with questions, states just feels bad and is tired.      General Comments      Exercises      Assessment/Plan    PT Assessment Patient needs continued PT services  PT Problem List Decreased activity tolerance;Decreased balance;Pain;Cardiopulmonary status limiting activity       PT Treatment Interventions DME instruction;Gait training;Functional mobility training;Therapeutic activities;Therapeutic exercise;Balance training;Patient/family education    PT Goals (Current goals can be found in the Care Plan section)  Acute Rehab PT Goals Patient Stated Goal: home with daughter to assist and HHPT PT Goal Formulation: With patient Time For Goal Achievement: 06/13/21 Potential to Achieve Goals: Good    Frequency Min 3X/week   Barriers to discharge        Co-evaluation               AM-PAC PT "6 Clicks" Mobility  Outcome Measure Help needed turning from your back to your side while in a flat bed without using bedrails?: None Help needed moving from lying on your back to sitting on the side of a flat bed without using bedrails?: None Help needed moving to and from a bed to a chair (including a wheelchair)?: A Little Help needed standing up from a chair using your arms (e.g., wheelchair or bedside chair)?: A Little Help needed to walk in hospital room?: A Little Help needed climbing 3-5 steps with a railing? : A Little 6 Click Score: 20    End of Session Equipment Utilized During Treatment: Gait belt Activity Tolerance: Patient tolerated treatment well Patient left: in bed;with call bell/phone within reach;with family/visitor present Nurse Communication: Mobility status PT Visit Diagnosis: Other abnormalities of gait and mobility (R26.89)    Time: 9476-5465 PT Time Calculation (min) (ACUTE ONLY): 23 min   Charges:   PT Evaluation $PT Eval Low Complexity: 1 Low PT Treatments $Gait Training: 8-22 mins         Tori Avalyn Molino PT, DPT 05/30/21, 11:35 AM

## 2021-06-03 LAB — CULTURE, BLOOD (ROUTINE X 2)
Culture: NO GROWTH
Culture: NO GROWTH
Special Requests: ADEQUATE

## 2021-06-10 ENCOUNTER — Telehealth: Payer: Self-pay | Admitting: Internal Medicine

## 2021-06-10 NOTE — Telephone Encounter (Signed)
Per phone call from pt's daughter Lattie Haw- she called stating that pt had Covid and her BP is dropping.   On Saturday it was 91/63  Would like to speak w/ someone about her BP medications   Please call [phone]

## 2021-06-10 NOTE — Telephone Encounter (Signed)
Spoke to pt's daughter who stated that pt's bp has been dropping since pt started Coreg. Pt's bp has been between 91/63- 93/51. Pt's daughter stopped her coreg for the last two days and pt admittedly felt better, stating that she didn't feel dizzy or lightheaded after stopping Coreg. Please advise.

## 2021-06-11 ENCOUNTER — Encounter (HOSPITAL_COMMUNITY): Payer: Self-pay | Admitting: Student

## 2021-06-11 ENCOUNTER — Inpatient Hospital Stay (HOSPITAL_COMMUNITY)
Admission: EM | Admit: 2021-06-11 | Discharge: 2021-06-13 | DRG: 309 | Disposition: A | Discharge status: Home Health | Payer: Medicare Other | Attending: Internal Medicine | Admitting: Internal Medicine

## 2021-06-11 ENCOUNTER — Emergency Department (HOSPITAL_COMMUNITY): Payer: Medicare Other

## 2021-06-11 ENCOUNTER — Inpatient Hospital Stay (HOSPITAL_COMMUNITY): Payer: Medicare Other

## 2021-06-11 DIAGNOSIS — Z853 Personal history of malignant neoplasm of breast: Secondary | ICD-10-CM | POA: Diagnosis not present

## 2021-06-11 DIAGNOSIS — N179 Acute kidney failure, unspecified: Secondary | ICD-10-CM | POA: Diagnosis present

## 2021-06-11 DIAGNOSIS — Z950 Presence of cardiac pacemaker: Secondary | ICD-10-CM | POA: Diagnosis not present

## 2021-06-11 DIAGNOSIS — Z79899 Other long term (current) drug therapy: Secondary | ICD-10-CM | POA: Diagnosis not present

## 2021-06-11 DIAGNOSIS — Z9013 Acquired absence of bilateral breasts and nipples: Secondary | ICD-10-CM | POA: Diagnosis not present

## 2021-06-11 DIAGNOSIS — Z794 Long term (current) use of insulin: Secondary | ICD-10-CM

## 2021-06-11 DIAGNOSIS — Z8616 Personal history of COVID-19: Secondary | ICD-10-CM | POA: Diagnosis not present

## 2021-06-11 DIAGNOSIS — Z7984 Long term (current) use of oral hypoglycemic drugs: Secondary | ICD-10-CM

## 2021-06-11 DIAGNOSIS — I358 Other nonrheumatic aortic valve disorders: Secondary | ICD-10-CM | POA: Diagnosis present

## 2021-06-11 DIAGNOSIS — I4819 Other persistent atrial fibrillation: Secondary | ICD-10-CM | POA: Diagnosis present

## 2021-06-11 DIAGNOSIS — E1169 Type 2 diabetes mellitus with other specified complication: Secondary | ICD-10-CM

## 2021-06-11 DIAGNOSIS — N184 Chronic kidney disease, stage 4 (severe): Secondary | ICD-10-CM | POA: Diagnosis present

## 2021-06-11 DIAGNOSIS — I959 Hypotension, unspecified: Secondary | ICD-10-CM | POA: Diagnosis present

## 2021-06-11 DIAGNOSIS — Z833 Family history of diabetes mellitus: Secondary | ICD-10-CM

## 2021-06-11 DIAGNOSIS — I495 Sick sinus syndrome: Secondary | ICD-10-CM | POA: Diagnosis present

## 2021-06-11 DIAGNOSIS — E119 Type 2 diabetes mellitus without complications: Secondary | ICD-10-CM

## 2021-06-11 DIAGNOSIS — Z7901 Long term (current) use of anticoagulants: Secondary | ICD-10-CM

## 2021-06-11 DIAGNOSIS — I129 Hypertensive chronic kidney disease with stage 1 through stage 4 chronic kidney disease, or unspecified chronic kidney disease: Secondary | ICD-10-CM | POA: Diagnosis present

## 2021-06-11 DIAGNOSIS — Z823 Family history of stroke: Secondary | ICD-10-CM

## 2021-06-11 DIAGNOSIS — Z888 Allergy status to other drugs, medicaments and biological substances status: Secondary | ICD-10-CM

## 2021-06-11 DIAGNOSIS — Z90710 Acquired absence of both cervix and uterus: Secondary | ICD-10-CM | POA: Diagnosis not present

## 2021-06-11 DIAGNOSIS — I10 Essential (primary) hypertension: Secondary | ICD-10-CM | POA: Diagnosis not present

## 2021-06-11 DIAGNOSIS — Z86711 Personal history of pulmonary embolism: Secondary | ICD-10-CM

## 2021-06-11 DIAGNOSIS — E785 Hyperlipidemia, unspecified: Secondary | ICD-10-CM | POA: Diagnosis present

## 2021-06-11 DIAGNOSIS — J45909 Unspecified asthma, uncomplicated: Secondary | ICD-10-CM | POA: Diagnosis present

## 2021-06-11 DIAGNOSIS — R531 Weakness: Secondary | ICD-10-CM | POA: Diagnosis not present

## 2021-06-11 DIAGNOSIS — E860 Dehydration: Secondary | ICD-10-CM | POA: Diagnosis present

## 2021-06-11 DIAGNOSIS — M810 Age-related osteoporosis without current pathological fracture: Secondary | ICD-10-CM | POA: Diagnosis present

## 2021-06-11 DIAGNOSIS — Z806 Family history of leukemia: Secondary | ICD-10-CM

## 2021-06-11 DIAGNOSIS — I4891 Unspecified atrial fibrillation: Secondary | ICD-10-CM | POA: Diagnosis not present

## 2021-06-11 DIAGNOSIS — Z85828 Personal history of other malignant neoplasm of skin: Secondary | ICD-10-CM

## 2021-06-11 DIAGNOSIS — K219 Gastro-esophageal reflux disease without esophagitis: Secondary | ICD-10-CM | POA: Diagnosis present

## 2021-06-11 DIAGNOSIS — E1122 Type 2 diabetes mellitus with diabetic chronic kidney disease: Secondary | ICD-10-CM | POA: Diagnosis present

## 2021-06-11 DIAGNOSIS — H409 Unspecified glaucoma: Secondary | ICD-10-CM | POA: Diagnosis present

## 2021-06-11 LAB — COMPREHENSIVE METABOLIC PANEL
ALT: 24 U/L (ref 0–44)
AST: 32 U/L (ref 15–41)
Albumin: 3.2 g/dL — ABNORMAL LOW (ref 3.5–5.0)
Alkaline Phosphatase: 27 U/L — ABNORMAL LOW (ref 38–126)
Anion gap: 10 (ref 5–15)
BUN: 47 mg/dL — ABNORMAL HIGH (ref 8–23)
CO2: 23 mmol/L (ref 22–32)
Calcium: 10.4 mg/dL — ABNORMAL HIGH (ref 8.9–10.3)
Chloride: 103 mmol/L (ref 98–111)
Creatinine, Ser: 1.74 mg/dL — ABNORMAL HIGH (ref 0.44–1.00)
GFR, Estimated: 29 mL/min — ABNORMAL LOW (ref >60)
Glucose, Bld: 159 mg/dL — ABNORMAL HIGH (ref 70–99)
Potassium: 5.2 mmol/L — ABNORMAL HIGH (ref 3.5–5.1)
Sodium: 136 mmol/L (ref 135–145)
Total Bilirubin: 0.6 mg/dL (ref 0.3–1.2)
Total Protein: 6.2 g/dL — ABNORMAL LOW (ref 6.5–8.1)

## 2021-06-11 LAB — I-STAT CHEM 8, ED
BUN: 42 mg/dL — ABNORMAL HIGH (ref 8–23)
Calcium, Ion: 1.32 mmol/L (ref 1.15–1.40)
Chloride: 103 mmol/L (ref 98–111)
Creatinine, Ser: 1.8 mg/dL — ABNORMAL HIGH (ref 0.44–1.00)
Glucose, Bld: 152 mg/dL — ABNORMAL HIGH (ref 70–99)
HCT: 30 % — ABNORMAL LOW (ref 36.0–46.0)
Hemoglobin: 10.2 g/dL — ABNORMAL LOW (ref 12.0–15.0)
Potassium: 5.2 mmol/L — ABNORMAL HIGH (ref 3.5–5.1)
Sodium: 135 mmol/L (ref 135–145)
TCO2: 23 mmol/L (ref 22–32)

## 2021-06-11 LAB — LIPASE, BLOOD: Lipase: 44 U/L (ref 11–51)

## 2021-06-11 LAB — CBC WITH DIFFERENTIAL/PLATELET
Abs Immature Granulocytes: 0.03 10*3/uL (ref 0.00–0.07)
Basophils Absolute: 0 10*3/uL (ref 0.0–0.1)
Basophils Relative: 0 %
Eosinophils Absolute: 0 10*3/uL (ref 0.0–0.5)
Eosinophils Relative: 0 %
HCT: 32.5 % — ABNORMAL LOW (ref 36.0–46.0)
Hemoglobin: 10.2 g/dL — ABNORMAL LOW (ref 12.0–15.0)
Immature Granulocytes: 1 %
Lymphocytes Relative: 38 %
Lymphs Abs: 2.4 10*3/uL (ref 0.7–4.0)
MCH: 26.8 pg (ref 26.0–34.0)
MCHC: 31.4 g/dL (ref 30.0–36.0)
MCV: 85.5 fL (ref 80.0–100.0)
Monocytes Absolute: 0.5 10*3/uL (ref 0.1–1.0)
Monocytes Relative: 9 %
Neutro Abs: 3.4 10*3/uL (ref 1.7–7.7)
Neutrophils Relative %: 52 %
Platelets: 195 10*3/uL (ref 150–400)
RBC: 3.8 MIL/uL — ABNORMAL LOW (ref 3.87–5.11)
RDW: 14.8 % (ref 11.5–15.5)
WBC: 6.4 10*3/uL (ref 4.0–10.5)
nRBC: 0 % (ref 0.0–0.2)

## 2021-06-11 LAB — TSH: TSH: 2.059 u[IU]/mL (ref 0.350–4.500)

## 2021-06-11 LAB — CBG MONITORING, ED: Glucose-Capillary: 156 mg/dL — ABNORMAL HIGH (ref 70–99)

## 2021-06-11 MED ORDER — ACETAMINOPHEN 325 MG PO TABS: 650.0000 mg | ORAL_TABLET | Freq: Four times a day (QID) | ORAL | Status: DC | PRN

## 2021-06-11 MED ORDER — LACTATED RINGERS IV SOLN: INTRAVENOUS | Status: DC

## 2021-06-11 MED ORDER — ROSUVASTATIN CALCIUM 20 MG PO TABS
40.0000 mg | ORAL_TABLET | Freq: Every day | ORAL | Status: DC
  Administered 2021-06-12 (×2): 40 mg via ORAL
  Filled 2021-06-11 (×2): qty 2

## 2021-06-11 MED ORDER — DILTIAZEM HCL-DEXTROSE 125-5 MG/125ML-% IV SOLN (PREMIX)
5.0000 mg/h | INTRAVENOUS | Status: DC
  Administered 2021-06-11: 5 mg/h via INTRAVENOUS
  Filled 2021-06-11 (×2): qty 125

## 2021-06-11 MED ORDER — INSULIN ASPART 100 UNIT/ML IJ SOLN
0.0000 [IU] | Freq: Three times a day (TID) | INTRAMUSCULAR | Status: DC
  Administered 2021-06-12: 1 [IU] via SUBCUTANEOUS
  Administered 2021-06-12 (×2): 5 [IU] via SUBCUTANEOUS
  Administered 2021-06-13: 3 [IU] via SUBCUTANEOUS
  Administered 2021-06-13: 2 [IU] via SUBCUTANEOUS

## 2021-06-11 MED ORDER — ACETAMINOPHEN 650 MG RE SUPP: 650.0000 mg | Freq: Four times a day (QID) | RECTAL | Status: DC | PRN

## 2021-06-11 MED ORDER — DORZOLAMIDE HCL-TIMOLOL MAL 2-0.5 % OP SOLN
1.0000 [drp] | Freq: Two times a day (BID) | OPHTHALMIC | Status: DC
  Administered 2021-06-12 – 2021-06-13 (×3): 1 [drp] via OPHTHALMIC
  Filled 2021-06-11: qty 10

## 2021-06-11 MED ORDER — BRIMONIDINE TARTRATE 0.2 % OP SOLN
1.0000 [drp] | Freq: Three times a day (TID) | OPHTHALMIC | Status: DC
  Administered 2021-06-12 – 2021-06-13 (×3): 1 [drp] via OPHTHALMIC
  Filled 2021-06-11: qty 5

## 2021-06-11 MED ORDER — PANTOPRAZOLE SODIUM 40 MG PO TBEC
40.0000 mg | DELAYED_RELEASE_TABLET | Freq: Every day | ORAL | Status: DC
  Administered 2021-06-12 – 2021-06-13 (×2): 40 mg via ORAL
  Filled 2021-06-11 (×2): qty 1

## 2021-06-11 MED ORDER — SODIUM CHLORIDE 0.9 % IV BOLUS
500.0000 mL | Freq: Once | INTRAVENOUS | Status: AC
  Administered 2021-06-11: 500 mL via INTRAVENOUS

## 2021-06-11 MED ORDER — DILTIAZEM LOAD VIA INFUSION
10.0000 mg | Freq: Once | INTRAVENOUS | Status: AC
  Administered 2021-06-11: 10 mg via INTRAVENOUS
  Filled 2021-06-11: qty 10

## 2021-06-11 MED ORDER — APIXABAN 2.5 MG PO TABS
2.5000 mg | ORAL_TABLET | Freq: Two times a day (BID) | ORAL | Status: DC
  Administered 2021-06-11 – 2021-06-13 (×4): 2.5 mg via ORAL
  Filled 2021-06-11 (×4): qty 1

## 2021-06-11 MED ORDER — MONTELUKAST SODIUM 10 MG PO TABS
10.0000 mg | ORAL_TABLET | Freq: Every day | ORAL | Status: DC
  Administered 2021-06-12 (×2): 10 mg via ORAL
  Filled 2021-06-11 (×3): qty 1

## 2021-06-11 MED ORDER — INSULIN GLARGINE-YFGN 100 UNIT/ML ~~LOC~~ SOLN
5.0000 [IU] | Freq: Every day | SUBCUTANEOUS | Status: DC
  Administered 2021-06-12 (×2): 5 [IU] via SUBCUTANEOUS
  Filled 2021-06-11 (×4): qty 0.05

## 2021-06-11 NOTE — Telephone Encounter (Signed)
New message     Daughter called again today about her medication , patient blood pressure is 92/71 and  93/72 , she has cut her medication in half but it is still low .

## 2021-06-11 NOTE — Telephone Encounter (Signed)
New message   Patient is in AFIB , in [facility] office   STAT if HR is under 50 or over 120 (normal HR is 60-100 beats per minute)  What is your heart rate? 150   Do you have a log of your heart rate readings (document readings)? 150  Do you have any other symptoms?   Dr Tawny Asal wants to speak with Dr Lovena Le

## 2021-06-11 NOTE — ED Notes (Signed)
Provider at bedside, aware that pt needs an ultra sound guided IV

## 2021-06-11 NOTE — ED Notes (Signed)
[employer] pacemaker interrogated by this RN.

## 2021-06-11 NOTE — ED Triage Notes (Signed)
Pt hee sent over by office for afib rvr and some nausea , pt had covid and strept on 8/9

## 2021-06-11 NOTE — Consult Note (Signed)
Cardiology Consultation:   Patient ID: Kristen Fox MRN: [id number]; DOB: 1934/06/30  Admit date: 06/11/2021 Date of Consult: 06/11/2021  PCP:  Kristen Kaufmann, MD   [facility] Providers Cardiologist:  Kristen Peru, MD   Patient Profile:   Kristen Fox is a 87 y.o. female with a hx of persistent atrial fibrillation on eliquis, HTN, HLD, DM, CKD who is being seen 06/11/2021 for the evaluation of Afib RVR at the request of Kristen Fox.  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   She had COVID infection early in the pandemic complicated by PE. She is on eliquis. Pt was last seen by cardiology 03/20/21 by Kristen Fox. At that time, lopressor switched to coreg for better pressure control.   She is maintained on coreg 25 mg BID, 120 mg cardizem 12.5 mg HCTZ, 40 mg lisinopril, crestor, and 2.5 mg eliquis.   Pt was recently seen in Urgent Care 05/28/21 with positive COVID-19 and strep throat. She was prescribed paxlovid and Zithromax. She suffered 2 falls since the onset of symptoms and had generalized weakness with poor PO intake. She presented to [facility] 05/29/21 with dehydration (throat pain). She took 2 days of paxlovid and did not hold eliquis. She was treated with IV remdesivir x 1 on 05/30/21. She was also given prednisone for throat discomfort. She had acute on chronic renal insufficiency due to dehydration and lisinopril and HCTZ were temporarily held. She was discharged the following day.   Pt has had hypotension since discharge and her daughter has been reducing her medications at home. Due to concern for ongoing hypotension, she was brought back to the ER 06/11/21. She has reduced coreg to 12.5 mg BID for 2 days with some improvement in dizziness. She was seen by PCP today and was in Afib RVR with rates in the 150s. PCP discussed with Kristen Fox and decision was made to send her to the ER.  Cardiology was consulted for further management of Afib.   K 5.2 Cr 1.80 Hb 10.2  She was started on cardizem gtt which has been titrated to 5 mg/hr with improvement in HR. Pt denies SOB, chest pain, orthopnea, lower extremity edema, and syncope. She sleeps on 2 pillows at baseline. She has had low appetite and weakness that were attributed to COVID infection.   Daughter is at bedside and expressed significant frustration about lack of communication from Kristen Fox office. I explained that she was asked to see PCP to get her in as soon as possible.    Past Medical History:  Diagnosis Date   Asthma due to environmental allergies    Basal cell carcinoma 05/08/2020   bcc ant. mid neck    Breast cancer ([place])    2003   Cataract    Chronic kidney disease    Clotting disorder ([place])    Diabetes mellitus ([place])    type II   Diabetic coma ([place])    GERD (gastroesophageal reflux disease)    Glaucoma    Hyperlipidemia    Hypertension    Osteoporosis    Pneumonia    Pulmonary embolism ([place])    Squamous cell carcinoma of skin 10/18/2014   well diff-right upper arm [id number]   Squamous cell carcinoma of skin 06/02/2019   in situ-upper lip (txpbx)   Tubular adenoma of colon 2017    Past Surgical History:  Procedure Laterality Date  APPENDECTOMY     CATARACT EXTRACTION, BILATERAL     MASTECTOMY     left w/ymph node removal   PACEMAKER IMPLANT N/A 11/19/2020   Procedure: PACEMAKER IMPLANT;  Surgeon: Deboraha Sprang, MD;  Location: [facility];  Service: Cardiovascular;  Laterality: N/A;   SKIN CANCER EXCISION     TONSILLECTOMY     VAGINAL HYSTERECTOMY  1981     Home Medications:  Prior to Admission medications   Medication Sig Start Date End Date Taking? Authorizing Provider  alclomethasone (ACLOVATE) 0.05 % cream Apply topically 2 (two) times daily as needed (Rash). 02/06/21  Yes Sheffield, Vida Roller R, PA-C  apixaban (ELIQUIS) 2.5 MG TABS tablet Take 1 tablet (2.5 mg total) by  mouth 2 (two) times daily. 11/21/20  Yes Annita Brod, MD  ascorbic acid (VITAMIN C) 500 MG tablet Take 1 tablet (500 mg total) by mouth daily. 05/31/21  Yes Emokpae, Courage, MD  benzocaine-menthol (CHLORAEPTIC) 6-10 MG lozenge Take 1 lozenge by mouth as needed for sore throat. 05/30/21  Yes Emokpae, Courage, MD  brimonidine (ALPHAGAN) 0.2 % ophthalmic solution Place 1 drop into both eyes 3 (three) times daily.   Yes [provider]  Calcium Carb-Cholecalciferol (CALCIUM 600-D PO) Take 1 tablet by mouth 2 (two) times daily.   Yes [provider]  carvedilol (COREG) 25 MG tablet Take 1 tablet (25 mg total) by mouth 2 (two) times daily. 05/08/21 08/06/21 Yes Evans Lance, MD  Cholecalciferol (VITAMIN D3 PO) Take 1 tablet by mouth 2 (two) times daily.   Yes [provider]  cholestyramine (QUESTRAN) 4 g packet Take 1 packet by mouth daily. 06/05/21  Yes [provider]  denosumab (PROLIA) 60 MG/ML SOLN injection Inject 60 mg into the skin every 6 (six) months. Administer in upper arm, thigh, or abdomen   Yes [provider]  diltiazem (CARDIZEM CD) 120 MG 24 hr capsule Take 1 capsule (120 mg total) by mouth daily. 03/20/21  Yes Evans Lance, MD  dorzolamide-timolol (COSOPT) 22.3-6.8 MG/ML ophthalmic solution Place 1 drop into both eyes 2 (two) times daily.   Yes [provider]  esomeprazole (NEXIUM) 20 MG capsule Take 20 mg by mouth daily at 12 noon.   Yes [provider]  gabapentin (NEURONTIN) 100 MG capsule Take 1 capsule (100 mg total) by mouth 2 (two) times daily. 11/21/20  Yes Annita Brod, MD  guaiFENesin-dextromethorphan (ROBITUSSIN DM) 100-10 MG/5ML syrup Take 10 mLs by mouth every 8 (eight) hours. Patient taking differently: Take 10 mLs by mouth every 8 (eight) hours as needed for cough. 05/30/21  Yes Emokpae, Courage, MD  hydrochlorothiazide (HYDRODIURIL) 12.5 MG tablet Take 1 tablet (12.5 mg total) by mouth daily. 06/05/21   Yes Emokpae, Courage, MD  insulin degludec (TRESIBA FLEXTOUCH) 100 UNIT/ML FlexTouch Pen Inject 10-25 Units into the skin at bedtime. Based on CBG   Yes [provider]  insulin lispro (HUMALOG) 100 UNIT/ML KwikPen Inject 8-14 Units into the skin daily with lunch. Per sliding scale   Yes [provider]  latanoprost (XALATAN) 0.005 % ophthalmic solution Place 1 drop into both eyes at bedtime. 08/23/20  Yes [provider]  lisinopril (ZESTRIL) 40 MG tablet Take 1 tablet (40 mg total) by mouth daily. 06/05/21  Yes Emokpae, Courage, MD  metFORMIN (GLUCOPHAGE-XR) 500 MG 24 hr tablet Take 1,000 mg by mouth 2 (two) times daily. 06/26/20  Yes [provider]  montelukast (SINGULAIR) 10 MG tablet Take 10 mg  by mouth at bedtime.   Yes [provider]  rosuvastatin (CRESTOR) 40 MG tablet Take 40 mg by mouth at bedtime.   Yes [provider]  vitamin B-12 (CYANOCOBALAMIN) 500 MCG tablet Take 1,000 mcg by mouth daily.   Yes [provider]  albuterol (VENTOLIN HFA) 108 (90 Base) MCG/ACT inhaler Inhale 2 puffs into the lungs every 6 (six) hours as needed for wheezing or shortness of breath (cough). 05/30/21   Roxan Hockey, MD  azithromycin (ZITHROMAX) 500 MG tablet Take 1 tablet (500 mg total) by mouth daily. Patient not taking: Reported on 06/11/2021 05/31/21   Roxan Hockey, MD  PAXLOVID 20 x 150 MG & 10 x 100MG  TBPK Take 2 tablets by mouth in the morning and at bedtime. Starting 8.9.22 05/28/21   [provider]  zinc sulfate 220 (50 Zn) MG capsule Take 1 capsule (220 mg total) by mouth daily. Patient not taking: Reported on 06/11/2021 05/31/21   Roxan Hockey, MD    Inpatient Medications: Scheduled Meds:  diltiazem  10 mg Intravenous Once   Continuous Infusions:  diltiazem (CARDIZEM) infusion     PRN Meds:   Allergies:    Allergies  Allergen Reactions   Metformin Other (See Comments)    Caused stomach upset (tolerates  timed release)    Social History:   Social History   Socioeconomic History   Marital status: Single    Spouse name: Not on file   Number of children: Not on file   Years of education: Not on file   Highest education level: Not on file  Occupational History   Not on file  Tobacco Use   Smoking status: Never   Smokeless tobacco: Never  Vaping Use   Vaping Use: Never used  Substance and Sexual Activity   Alcohol use: No   Drug use: No   Sexual activity: Not on file  Other Topics Concern   Not on file  Social History Narrative   Lives alone   Social Determinants of Health   Financial Resource Strain: Not on file  Food Insecurity: Not on file  Transportation Needs: Not on file  Physical Activity: Not on file  Stress: Not on file  Social Connections: Not on file  Intimate Partner Violence: Not on file    Family History:    Family History  Problem Relation Age of Onset   Diabetes Mother    CVA Mother    Leukemia Father    Colon cancer Neg Hx    Stomach cancer Neg Hx    Pancreatic cancer Neg Hx    Esophageal cancer Neg Hx      ROS:  Please see the history of present illness.   All other ROS reviewed and negative.     Physical Exam/Data:   Vitals:   06/11/21 1631 06/11/21 1715  BP: 107/79 116/81  Pulse: (!) 52 (!) 156  Resp: 20 (!) 21  Temp: 99.1 F (37.3 C)   SpO2: 99% 99%   No intake or output data in the 24 hours ending 06/11/21 1827 Last 3 Weights 05/29/2021 03/20/2021 12/14/2020  Weight (lbs) 162 lb 4.1 oz 162 lb 3.2 oz 166 lb  Weight (kg) 73.6 kg 73.573 kg 75.297 kg     There is no height or weight on file to calculate BMI.  General:  elderly female in NAD Neck: no JVD Vascular: No carotid bruits Cardiac:  irregular rhythm, tachycardic rate Lungs:  respirations unlabored, rhonchi in right lower lobe Abd: soft,  nontender, no hepatomegaly  Ext: no edema Musculoskeletal:  No deformities, BUE and BLE strength normal and equal Skin: warm and dry   Neuro:  CNs 2-12 intact, no focal abnormalities noted Psych:  Normal affect   EKG:  The EKG was personally reviewed and demonstrates:  Afib with ventricular rate 138 Telemetry:  Telemetry was personally reviewed and demonstrates:  Afib in the 100-120s, PVCs, NSVT  Relevant CV Studies:  Echo 11/2020:  1. Left ventricular ejection fraction, by estimation, is 60 to 65%. The  left ventricle has normal function. The left ventricle has no regional  wall motion abnormalities. There is mild left ventricular hypertrophy.  Left ventricular diastolic parameters  are indeterminate.   2. Right ventricular systolic function is mildly reduced. The right  ventricular size is normal. There is mildly elevated pulmonary artery  systolic pressure. The estimated right ventricular systolic pressure is  44.9 mmHg.   3. The mitral valve is normal in structure. Trivial mitral valve  regurgitation.   4. The aortic valve is tricuspid. Aortic valve regurgitation is not  visualized. Mild aortic valve sclerosis is present, with no evidence of  aortic valve stenosis.   5. The inferior vena cava is normal in size with greater than 50%  respiratory variability, suggesting right atrial pressure of 3 mmHg.   Laboratory Data:  High Sensitivity Troponin:  No results for input(s): TROPONINIHS in the last 720 hours.   ChemistryNo results for input(s): NA, K, CL, CO2, GLUCOSE, BUN, CREATININE, CALCIUM, GFRNONAA, GFRAA, ANIONGAP in the last 168 hours.  No results for input(s): PROT, ALBUMIN, AST, ALT, ALKPHOS, BILITOT in the last 168 hours. HematologyNo results for input(s): WBC, RBC, HGB, HCT, MCV, MCH, MCHC, RDW, PLT in the last 168 hours. BNPNo results for input(s): BNP, PROBNP in the last 168 hours.  DDimer No results for input(s): DDIMER in the last 168 hours.   Radiology/Studies:  No results found.   Assessment and Plan:   Afib RVR Persistent Afib - burden historically 20% SSS with [employer] PPM in  place Hypertension - recent COVID and strep throat - Afib RVR with hypotension at home - home regimen includes: coreg 25 mg BID, 120 mg cardizem 12.5 mg HCTZ, and 40 mg lisinopril - hold lisinopril and coreg - continue titration of cardizem at BP tolerates - will interrogate her PPM to determine Afib burden and rate control - discussed that we will focus on rate control for now - pt is not hypotensive here, nevertheless we can transition back to metoprolol for better rate control   Chronic anticoagulation - appropriately on 2.5 mg eliquis BID   CKD IV - baseline creatinine unclear but was 1.42 at discharge on 05/30/21   Hyperlipidemia - continue statin   Risk Assessment/Risk Scores:     CHA2DS2-VASc Score = 5 This indicates a 7.2% annual risk of stroke. The patient's score is based upon: CHF History: No HTN History: Yes Diabetes History: Yes Stroke History: No Vascular Disease History: No Age Score: 2 Gender Score: 1       For questions or updates, please contact [place] Please consult [email or web address] for contact info under    Signed, Ledora Bottcher, [place]  06/11/2021 6:27 PM

## 2021-06-11 NOTE — H&P (Signed)
History and Physical    PLEASE NOTE THAT DRAGON DICTATION SOFTWARE WAS USED IN THE CONSTRUCTION OF THIS NOTE.   Kristen Fox WCB:[id number] DOB: 12-29-1933 DOA: 06/11/2021  PCP: Josem Kaufmann, MD Patient coming from: home   I have personally briefly reviewed patient's old medical records in [place]  Chief Complaint: Generalized weakness  HPI: Kristen Fox is a 87 y.o. female with medical history significant for persistent atrial fibrillation chronically anticoagulated on Eliquis, hypertension, type 2 diabetes mellitus, stage IIIb chronic kidney disease associated with baseline creatinine 1.4-1.6, who is admitted to [facility] on 06/11/2021 with atrial fibrillation with RVR after presenting from home to [facility] ED complaining of generalized weakness.   The patient was recently hospitalized for COVID-19 infection from 05/29/2021 to 05/30/2021 after experiencing 2 days of productive cough, sore throat, with the symptoms starting on 05/27/2021, prompting her to present to local urgent care on 05/28/2021, at which time COVID-19 testing was found to be positive.  She received a dose of pack Slo-Bid on 05/28/2021 followed by another dose on the morning of 05/29/2021, but in the setting of no significant improvement in her cough, presented to [facility] ED on 05/29/21 at which time she was admitted for further evaluation management of COVID infection.  Over the course of her hospitalization there, she received 1 dose of remdesivir and was discharged home on 05/30/2021.   Following discharge from [facility], she reports ensuing complete resolution of her productive cough as well as sore throat.  However, over the last 3 to 4 days, she notes development of generalized weakness in the absence of any acute focal weakness, acute focal numbness, paresthesias, slurred speech, facial droop, acute change in vision, dysphagia, or vertigo.  She felt that her blood pressure was running a relatively  hypotensive a few days ago, noting systolic blood pressures less than 100, which is unusual for her.  At that time, she independently decreased her Coreg 25 mg p.o. twice daily to half of this dose.  Following this measure, the patient reports improvement in her blood pressure, but noted no associated improvement in her generalized weakness, prompting her to present to the office of her PCP earlier today, at which time she was found to be in atrial fibrillation with RVR.  PCP contacted the patient's outpatient cardiologist, Dr. Lovena Le, who recommended that the patient present to the emergency department for further evaluation and management of atrial fibrillation with RVR.  She also noted mild lightheadedness over the last few days, without the sensation of imminent loss of consciousness.  She specifically denies presyncope or syncope, denies any recent fall.   She denies any associated subjective fever, chills, rigors, or generalized myalgias.  Denies any recent headache, neck stiffness, shortness of breath, wheezing, nausea, vomiting, abdominal pain, diarrhea, or rash.  She also denies any acute urinary symptoms, including no recent dysuria, gross hematuria, or change in urinary urgency/frequency.  No recent chest pain, diaphoresis, or palpitations.  No recent orthopnea, PND, or worsening of edema in the bilateral lower extremities.  No recent melena or hematochezia.  In the setting of a history of persistent atrial fibrillation, she is chronically anticoagulated on Eliquis, reporting good associated compliance with this anticoagulant.  This history is complicated by sick sinus syndrome status post pacemaker placement.  Most recent echocardiogram was performed in February 2022, was notable for LVEF 60 to 65%, no evidence of focal wall motion abnormalities, mild LVH, indeterminate diastolic function, and no evidence of significant  valvular pathology.  Outpatient medications with AV nodal blocking  ramifications include Coreg 25 mg p.o. twice daily as well as diltiazem.  She is not currently on any beta-blocker at home.    ED Course:  Vital signs in the ED were notable for the following: Temperature max 99.0; initially found to be in atrial fibrillation with RVR and associated heart rate in the 150s, which is decreased into the low 100s - 120's bpm following initiation of diltiazem drip, as further detailed below; blood pressure initially noted to be 107/79, with interval increase to 145/84 concomitant with improving rate control on diltiazem drip; respiratory rate 15-21, oxygen saturation 97 to 100% on room air.  Labs were notable for the following: CMP was notable for the following: Sodium 136, potassium 5.2, bicarbonate 23, anion gap 10, creatinine 1.80 relative to most recent prior value of 1.48 on 05/30/2021, glucose 159, adjusted calcium 11.0 relative to adjusted calcium of 9.2 on 05/30/2021, albumin 3.2, and otherwise liver enzymes were found to be within normal limits.  CBC notable for white blood cell count 6400, hemoglobin 10.2.  TSH 2.059.  Urinalysis has been ordered with result currently pending.  EKG in comparison to most recent prior from 05/30/2021, shows atrial fibrillation with RVR with ventricular rate 139, and interval development of T wave inversion in lead III and aVF as well as interval development of less than 1 mm ST depression in V4 through V6.  Chest x-ray ordered, with result currently pending.  The patient's case, including the presence of atrial fibrillation with RVR as well as the above EKG findings were discussed with the on-call cardiologist, Dr. Gasper Sells, who will formally consult, with preliminary recommendations including holding home Coreg and lisinopril given recent hypotension and no history of cardiac pathology that would be associated with mortality benefits from these medications.  Additional preliminary cardiology recommendations include the possibility  of initiating metoprolol for optimization of rate control if ensuing blood pressures are amenable.   While in the ED, the following were administered: Diltiazem 10 mg IV x1 followed by initiation of diltiazem drip.  Additionally, 500 cc normal saline bolus was administered.      Review of Systems: As per HPI otherwise 10 point review of systems negative.   Past Medical History:  Diagnosis Date   Asthma due to environmental allergies    Basal cell carcinoma 05/08/2020   bcc ant. mid neck    Breast cancer ([place])    2003   Cataract    Chronic kidney disease    Clotting disorder ([place])    Diabetes mellitus ([place])    type II   Diabetic coma ([place])    GERD (gastroesophageal reflux disease)    Glaucoma    Hyperlipidemia    Hypertension    Osteoporosis    Pneumonia    Pulmonary embolism ([place])    Squamous cell carcinoma of skin 10/18/2014   well diff-right upper arm [id number]   Squamous cell carcinoma of skin 06/02/2019   in situ-upper lip (txpbx)   Tubular adenoma of colon 2017    Past Surgical History:  Procedure Laterality Date   APPENDECTOMY     CATARACT EXTRACTION, BILATERAL     MASTECTOMY     left w/ymph node removal   PACEMAKER IMPLANT N/A 11/19/2020   Procedure: PACEMAKER IMPLANT;  Surgeon: Deboraha Sprang, MD;  Location: [facility];  Service: Cardiovascular;  Laterality: N/A;   SKIN CANCER EXCISION     TONSILLECTOMY  VAGINAL HYSTERECTOMY  1981    Social History:  reports that she has never smoked. She has never used smokeless tobacco. She reports that she does not drink alcohol and does not use drugs.   Allergies  Allergen Reactions   Metformin Other (See Comments)    Caused stomach upset (tolerates timed release)    Family History  Problem Relation Age of Onset   Diabetes Mother    CVA Mother    Leukemia Father    Colon cancer Neg Hx    Stomach cancer Neg Hx    Pancreatic cancer Neg Hx    Esophageal cancer Neg Hx     Family history reviewed  and not pertinent    Prior to Admission medications   Medication Sig Start Date End Date Taking? Authorizing Provider  alclomethasone (ACLOVATE) 0.05 % cream Apply topically 2 (two) times daily as needed (Rash). 02/06/21  Yes Sheffield, Vida Roller R, PA-C  apixaban (ELIQUIS) 2.5 MG TABS tablet Take 1 tablet (2.5 mg total) by mouth 2 (two) times daily. 11/21/20  Yes Annita Brod, MD  ascorbic acid (VITAMIN C) 500 MG tablet Take 1 tablet (500 mg total) by mouth daily. 05/31/21  Yes Emokpae, Courage, MD  benzocaine-menthol (CHLORAEPTIC) 6-10 MG lozenge Take 1 lozenge by mouth as needed for sore throat. 05/30/21  Yes Emokpae, Courage, MD  brimonidine (ALPHAGAN) 0.2 % ophthalmic solution Place 1 drop into both eyes 3 (three) times daily.   Yes [provider]  Calcium Carb-Cholecalciferol (CALCIUM 600-D PO) Take 1 tablet by mouth 2 (two) times daily.   Yes [provider]  carvedilol (COREG) 25 MG tablet Take 1 tablet (25 mg total) by mouth 2 (two) times daily. 05/08/21 08/06/21 Yes Evans Lance, MD  Cholecalciferol (VITAMIN D3 PO) Take 1 tablet by mouth 2 (two) times daily.   Yes [provider]  cholestyramine (QUESTRAN) 4 g packet Take 1 packet by mouth daily. 06/05/21  Yes [provider]  denosumab (PROLIA) 60 MG/ML SOLN injection Inject 60 mg into the skin every 6 (six) months. Administer in upper arm, thigh, or abdomen   Yes [provider]  diltiazem (CARDIZEM CD) 120 MG 24 hr capsule Take 1 capsule (120 mg total) by mouth daily. 03/20/21  Yes Evans Lance, MD  dorzolamide-timolol (COSOPT) 22.3-6.8 MG/ML ophthalmic solution Place 1 drop into both eyes 2 (two) times daily.   Yes [provider]  esomeprazole (NEXIUM) 20 MG capsule Take 20 mg by mouth daily at 12 noon.   Yes [provider]  gabapentin (NEURONTIN) 100 MG capsule Take 1 capsule (100 mg total) by mouth 2 (two) times daily. 11/21/20  Yes Annita Brod, MD   guaiFENesin-dextromethorphan (ROBITUSSIN DM) 100-10 MG/5ML syrup Take 10 mLs by mouth every 8 (eight) hours. Patient taking differently: Take 10 mLs by mouth every 8 (eight) hours as needed for cough. 05/30/21  Yes Emokpae, Courage, MD  hydrochlorothiazide (HYDRODIURIL) 12.5 MG tablet Take 1 tablet (12.5 mg total) by mouth daily. 06/05/21  Yes Emokpae, Courage, MD  insulin degludec (TRESIBA FLEXTOUCH) 100 UNIT/ML FlexTouch Pen Inject 10-25 Units into the skin at bedtime. Based on CBG   Yes [provider]  insulin lispro (HUMALOG) 100 UNIT/ML KwikPen Inject 8-14 Units into the skin daily with lunch. Per sliding scale   Yes [provider]  latanoprost (XALATAN) 0.005 % ophthalmic solution Place 1 drop into both eyes at bedtime. 08/23/20  Yes [provider]  lisinopril (ZESTRIL) 40 MG tablet  Take 1 tablet (40 mg total) by mouth daily. 06/05/21  Yes Emokpae, Courage, MD  metFORMIN (GLUCOPHAGE-XR) 500 MG 24 hr tablet Take 1,000 mg by mouth 2 (two) times daily. 06/26/20  Yes [provider]  montelukast (SINGULAIR) 10 MG tablet Take 10 mg by mouth at bedtime.   Yes [provider]  rosuvastatin (CRESTOR) 40 MG tablet Take 40 mg by mouth at bedtime.   Yes [provider]  vitamin B-12 (CYANOCOBALAMIN) 500 MCG tablet Take 1,000 mcg by mouth daily.   Yes [provider]  albuterol (VENTOLIN HFA) 108 (90 Base) MCG/ACT inhaler Inhale 2 puffs into the lungs every 6 (six) hours as needed for wheezing or shortness of breath (cough). 05/30/21   Roxan Hockey, MD  azithromycin (ZITHROMAX) 500 MG tablet Take 1 tablet (500 mg total) by mouth daily. Patient not taking: Reported on 06/11/2021 05/31/21   Roxan Hockey, MD  PAXLOVID 20 x 150 MG & 10 x 100MG  TBPK Take 2 tablets by mouth in the morning and at bedtime. Starting 8.9.22 05/28/21   [provider]  zinc sulfate 220 (50 Zn) MG capsule Take 1 capsule (220 mg total) by mouth daily. Patient not  taking: Reported on 06/11/2021 05/31/21   Roxan Hockey, MD     Objective    Physical Exam: Vitals:   06/11/21 1840 06/11/21 1913 06/11/21 1930 06/11/21 2000  BP: 120/75 130/71 (!) 145/80 131/64  Pulse: (!) 125 96 61 (!) 106  Resp: 18 17 18 18   Temp:      SpO2: 95% 99% 99% 99%    General: appears to be stated age; alert, oriented Skin: warm, dry, no rash Head:  AT/[place] Mouth:  Oral mucosa membranes appear moist, normal dentition Neck: supple; trachea midline Heart: Irregular; did not appreciate any M/R/G Lungs: CTAB, did not appreciate any wheezes, rales, or rhonchi Abdomen: + BS; soft, ND, NT Vascular: 2+ pedal pulses b/l; 2+ radial pulses b/l Extremities: no peripheral edema, no muscle wasting Neuro: strength and sensation intact in upper and lower extremities b/l     Labs on Admission: I have personally reviewed following labs and imaging studies  CBC: Recent Labs  Lab 06/11/21 1830 06/11/21 1840  WBC 6.4  --   NEUTROABS 3.4  --   HGB 10.2* 10.2*  HCT 32.5* 30.0*  MCV 85.5  --   PLT 195  --    Basic Metabolic Panel: Recent Labs  Lab 06/11/21 1830 06/11/21 1840  NA 136 135  K 5.2* 5.2*  CL 103 103  CO2 23  --   GLUCOSE 159* 152*  BUN 47* 42*  CREATININE 1.74* 1.80*  CALCIUM 10.4*  --    GFR: Estimated Creatinine Clearance: 23.4 mL/min (A) (by C-G formula based on SCr of 1.8 mg/dL (H)). Liver Function Tests: Recent Labs  Lab 06/11/21 1830  AST 32  ALT 24  ALKPHOS 27*  BILITOT 0.6  PROT 6.2*  ALBUMIN 3.2*   Recent Labs  Lab 06/11/21 1830  LIPASE 44   No results for input(s): AMMONIA in the last 168 hours. Coagulation Profile: No results for input(s): INR, PROTIME in the last 168 hours. Cardiac Enzymes: No results for input(s): CKTOTAL, CKMB, CKMBINDEX, TROPONINI in the last 168 hours. BNP (last 3 results) No results for input(s): PROBNP in the last 8760 hours. HbA1C: No results for input(s): HGBA1C in the last 72 hours. CBG: No  results for input(s): GLUCAP in the last 168 hours. Lipid Profile: No results for input(s): CHOL,  HDL, LDLCALC, TRIG, CHOLHDL, LDLDIRECT in the last 72 hours. Thyroid Function Tests: Recent Labs    06/11/21 1830  TSH 2.059   Anemia Panel: No results for input(s): VITAMINB12, FOLATE, FERRITIN, TIBC, IRON, RETICCTPCT in the last 72 hours. Urine analysis:    Component Value Date/Time   COLORURINE YELLOW 11/19/2020 1405   APPEARANCEUR CLOUDY (A) 11/19/2020 1405   LABSPEC 1.014 11/19/2020 1405   PHURINE 6.0 11/19/2020 1405   GLUCOSEU >=500 (A) 11/19/2020 1405   HGBUR LARGE (A) 11/19/2020 1405   BILIRUBINUR NEGATIVE 11/19/2020 [place] 11/19/2020 1405   PROTEINUR 100 (A) 11/19/2020 1405   NITRITE NEGATIVE 11/19/2020 1405   LEUKOCYTESUR LARGE (A) 11/19/2020 1405    Radiological Exams on Admission: No results found.   EKG: Independently reviewed, with result as described above.    Assessment/Plan   Kristen Fox is a 87 y.o. female with medical history significant for persistent atrial fibrillation chronically anticoagulated on Eliquis, hypertension, type 2 diabetes mellitus, stage IIIb chronic kidney disease associated with baseline creatinine 1.4-1.6, who is [facility] to [facility] on 06/11/2021 with atrial fibrillation with RVR after presenting from home to [facility] ED complaining of generalized weakness.    Principal Problem:   Atrial fibrillation with RVR (HCC) Active Problems:   Hypertension   Diabetes mellitus (HCC)   Persistent atrial fibrillation (HCC)   AKI (acute kidney injury) ([place])   Hypercalcemia   Dehydration      #) Atrial fibrillation with RVR: In the setting of a known history of paroxysmal atrial fibrillation, the patient presents today in atrial fibrillation with ventricular rates in the 150's.  She appears mildly symptomatic in the setting of heart rates in the 150s, expressing intermittent lightheadedness and generalized weakness  over the last few days, but denies any associated chest pain, shortness of breath, palpitations, presyncope, or syncope. Of note, blood pressure appears to be tolerating these rates, and have increased slightly following interval improvement in rate control. In terms of factors leading to this exacerbation, physiologic stress from recent COVID-19 infection is suspected.  No evidence of other acute infectious process at this time, although the patient appears to be refusing chest x-ray.  Urinalysis pending.  Presenting EKG demonstrating atrial fibrillation with RVR and interval development of T wave inversion in the inferior leads as well as new less than 1 mm ST depression in V4 through V6, in the absence of any associated chest pain.  Cardiology consulted, as above, with recommendations for optimization of rate control, as further described above, without conveyance of concern for ACS, and without recommendation for checking or trending of troponin per preliminary recommendations.  In terms of outpatient AV nodal blocking regimen, cardiology recommends discontinuation of Coreg in order to reduce risk of development of ensuing hypotension, while recommending further optimization of home oral diltiazem as well as the possibility of introduction of Lopressor, if ensuing blood pressure tolerates.  In the meantime, we will continue rate control measures overnight with diltiazem drip.   As noted above, most recent echocardiogram occurred in February 2022 and demonstrated LVEF 60 to 65%, no focal motion abnormalities, and no significant valvular pathology. In the setting of a CHA2DS2-VASc score of 7, there is an indication for the patient to be on chronic anticoagulation for thromboembolic prophylaxis. Consistent with this, the patient is chronically anticoagulated on Eliquis.  Of note, TSH was checked in the ED today and found to be within normal limits.   Plan: Monitor strict I's and O's  and daily weights. Monitor  on telemetry. Check serum magnesium level with prn supplementation to maintain levels of greater than or equal to 2.0. Repeat BMP in the AM.  Repeat CBC in the AM.  Continue diltiazem drip, titrated to goal heart rate of less than 110 bpm.  Close monitoring of ensuing blood pressure.  Cardiology formally consulted, as above.  Hold home Coreg, per cardiology consult. for now, while on diltiazem drip, will hold home oral diltiazem, but with plan for resumption of this oral medication 1 to 2 hours prior to discontinuation of diltiazem drip in order to reduce risk for rebound tachycardia.  Continue home Eliquis.  Check urinalysis.  Repeat EKG following improvement in rate control, including evaluation of any interval changes in the previously noted nonspecific T wave inversion in inferior leads as well as ST depression V4 through V6.     #) Generalized weakness: 3 to 4 days of generalized weakness in the absence of any acute focal weakness.  Potentially multifactorial in nature, with suspected contributions from dehydration, AKI, atrial fibrillation with RVR, and acute hypercalcemia, as further detailed below.  No evidence of acute infectious process following recent diagnosis of COVID-19, although unable to further evaluate via chest x-ray on the basis of patient's refusal to pursue this radiographic eval. urinalysis currently pending.  TSH within normal limits when checked today.  Potential additional contribution from anticipated increase in circulating serum concentration of gabapentin given interval worsening in renal function.   Plan: Further evaluation and management of presenting atrial fibrillation with RVR, while striving for optimization of rate control, as detailed above.  In setting of dehydration and AKI, will provide continuous IV fluids overnight.  Check urinalysis.  Physical therapy consult placed for the morning.  Further evaluation and management of presenting acute hypercalcemia, as further  detailed below.  Repeat CMP and CBC in the morning.      #) Acute kidney injury superimposed on stage IIIb chronic kidney disease: In the setting of a documented history of CKD 3B with associated baseline creatinine range of 1.4-1.6, presenting serum creatinine noted to be 1.80.  This appears to be prerenal in the setting of dehydration, with clinical appearance of dry oral mucous membranes, as well as the patient's report of recent decline in oral intake of water.  In the setting of suspected dehydration picture, pharmacologic exacerbating factors include HCTZ in addition to home lisinopril.  Plan: Lactated Ringer's at 75 cc/h x 12 hours.  Monitor strict I's and O's and daily weights.  Attempt to avoid nephrotoxic agents.  Hold home lisinopril, HCTZ, and gabapentin for now.  Check urinalysis with microscopy.  Add on random urine sodium as well as random urine creatinine.  Repeat BMP in the morning.      #) Recent COVID-19 infection: Symptoms of mild productive cough and sore throat starting on 05/27/2021, with positive COVID-19 test performed on 05/28/2021, with ensuing complete resolution of the symptoms.  Per patient's report and review of associated recent discharge summary, patient's recent COVID-19 infection did not appear to be associate with any shortness of breath, and overall appears to consistent with a classification of mild severity.,  Given that positive COVID-19 test occurred greater than 10 days ago in the setting of mild COVID-19 infection, with interval improvement in associated symptoms, there is no indication for continuation of airborne precautions at this time.  Plan: Repeat CBC in the morning.  Hold airborne precautions, as above.      #) Hypercalcemia: Labs performed today  reflect an adjusted calcium level of 11.0, representing an acute, interval elevation relative to most recent prior adjusted calcium level of 9.2 on 05/30/2021.  Suspect that interval development of acute  hypercalcemia is multifactorial in nature, with contribution from dehydration in the setting of interval decline in oral intake, as further detailed above, with pharmacologic exacerbation in the setting of use of the following outpatient medications: Scheduled daily calcium carbonate with vitamin D in addition to separate oral vitamin D supplementation on a daily basis as well as use of HCTZ.   Plan: IV fluids as above.  Hold home HCTZ, calcium carbonate plus vitamin D, in addition to dedicated oral vitamin D supplementation.  Repeat CMP in the morning.  If no significant interval improvement in serum calcium level following these pharmacologic and IV fluid interventions, can consider expanding work-up to include intact PTH and phosphorus evaluation.  Check 25-hydroxy vitamin D level.       #) Essential hypertension: Patient hypertensive regimen includes Coreg, diltiazem, HCTZ, and lisinopril.  In the setting of recent outpatient hypotension, and per ensuing cardiology consultation today, will hold home lisinopril and Coreg.  Additionally, in the setting of hypercalcemia, will also hold outpatient HCTZ.  At the moment, also holding oral diltiazem while on diltiazem drip, as further detailed above.  Plan: Currently holding all home antihypertensive medications, as further detailed above, with plan for resumption of outpatient oral diltiazem 1 to 2 hours prior to discontinuation of diltiazem drip in order to reduce risk for ensuing rebound tachycardia.  Close monitoring of ensuing blood pressure via routine vital signs.      #) Type 2 diabetes mellitus: On long-acting insulin 10 units subcu nightly as well as sliding scale Humalog taken once a day with lunch.  Presenting blood sugar noted to be 159.  We will resume approximately half the patient's basal insulin dose initially during this hospitalization.  Plan: Lantus 5 units subcu nightly.  Accu-Cheks before every meal and at bedtime with low-dose  sliding scale insulin.       Hyperlipidemia: On high intensity rosuvastatin.  Plan: Continue home statin.      DVT prophylaxis: Continue home Eliquis, SCDs Code Status: Full code Family Communication: none Disposition Plan: Per Rounding Team Consults called: On-call cardiology consulted, as above;  Admission status: Inpatient; PCU     Of note, this patient was added by me to the following Admit List/Treatment Team: [email or web address].      PLEASE NOTE THAT DRAGON DICTATION SOFTWARE WAS USED IN THE CONSTRUCTION OF THIS NOTE.   [place] [facility] Pager [phone] From [place]  Otherwise, please contact night-coverage  [email or web address] Password [facility]   06/11/2021, 8:44 PM

## 2021-06-11 NOTE — ED Provider Notes (Signed)
[facility] EMERGENCY DEPARTMENT Provider Note   CSN: [id number] Arrival date & time: 06/11/21  1607     History Chief Complaint  Patient presents with   Weakness    Kristen Fox is a 87 y.o. female with a hx of CKD, DM, hypertension, hyperlipidemia, persistent afib on Eliquis, and recent admission who presents to the ED from PCP due to afib w/ RVR in clinic. Per patient and her daughter she has been struggling with waxing/waning weakness since discharge from the hospital 08/11, her weakness is worse with position changes & ambulation, she gets lightheaded as if she might fall/pass out with these activities. Has had some nausea and did vomit once earlier today. They have been checking her BP at home- 70s/50s a few days ago therefore they decided to lower her coreg dose from 25 mg BID 25 mg daily at night. Seen by PCP today for sxs- found to be in afib with rvr and was sent to the ED. She denies chest pain, dyspnea, cough, abdominal pain, or syncope.   Chart review for additional hx- Patient w/ recent hospital admission 05/29/21-05/30/21 due to generalized weakness with recurrent falls in the setting of COVID 19. Received paxlvoid/azithro from UC prior to admission, did not complete.    HPI     Past Medical History:  Diagnosis Date   Asthma due to environmental allergies    Basal cell carcinoma 05/08/2020   bcc ant. mid neck    Breast cancer ([place])    2003   Cataract    Chronic kidney disease    Clotting disorder ([place])    Diabetes mellitus ([place])    type II   Diabetic coma ([place])    GERD (gastroesophageal reflux disease)    Glaucoma    Hyperlipidemia    Hypertension    Osteoporosis    Pneumonia    Pulmonary embolism ([place])    Squamous cell carcinoma of skin 10/18/2014   well diff-right upper arm cx3 22fu   Squamous cell carcinoma of skin 06/02/2019   in situ-upper lip (txpbx)   Tubular adenoma of colon 2017    Patient Active Problem List   Diagnosis  Date Noted   Atrial fibrillation with RVR ([place]) 06/11/2021   Generalized weakness 05/29/2021   COVID-19 virus infection 05/29/2021   Falls 05/29/2021   Persistent atrial fibrillation ([place]) 11/27/2020   Secondary hypercoagulable state ([place]) 11/27/2020   Overweight (BMI 25.0-29.9) 11/21/2020   Acute lower UTI 11/21/2020   Hypertension    Diabetes mellitus ([place])    History of pulmonary embolism    CKD (chronic kidney disease), stage IV ([place])    Complete heart block ([place])    Tachy-brady syndrome ([place]) 11/17/2020    Past Surgical History:  Procedure Laterality Date   APPENDECTOMY     CATARACT EXTRACTION, BILATERAL     MASTECTOMY     left w/ymph node removal   PACEMAKER IMPLANT N/A 11/19/2020   Procedure: PACEMAKER IMPLANT;  Surgeon: Deboraha Sprang, MD;  Location: [facility];  Service: Cardiovascular;  Laterality: N/A;   SKIN CANCER EXCISION     TONSILLECTOMY     VAGINAL HYSTERECTOMY  1981     OB History   No obstetric history on file.     Family History  Problem Relation Age of Onset   Diabetes Mother    CVA Mother    Leukemia Father    Colon cancer Neg Hx    Stomach cancer Neg Hx  Pancreatic cancer Neg Hx    Esophageal cancer Neg Hx     Social History   Tobacco Use   Smoking status: Never   Smokeless tobacco: Never  Vaping Use   Vaping Use: Never used  Substance Use Topics   Alcohol use: No   Drug use: No    Home Medications Prior to Admission medications   Medication Sig Start Date End Date Taking? Authorizing Provider  alclomethasone (ACLOVATE) 0.05 % cream Apply topically 2 (two) times daily as needed (Rash). 02/06/21  Yes Sheffield, Vida Roller R, PA-C  apixaban (ELIQUIS) 2.5 MG TABS tablet Take 1 tablet (2.5 mg total) by mouth 2 (two) times daily. 11/21/20  Yes Annita Brod, MD  ascorbic acid (VITAMIN C) 500 MG tablet Take 1 tablet (500 mg total) by mouth daily. 05/31/21  Yes Emokpae, Courage, MD  benzocaine-menthol (CHLORAEPTIC) 6-10 MG lozenge  Take 1 lozenge by mouth as needed for sore throat. 05/30/21  Yes Emokpae, Courage, MD  brimonidine (ALPHAGAN) 0.2 % ophthalmic solution Place 1 drop into both eyes 3 (three) times daily.   Yes [provider]  Calcium Carb-Cholecalciferol (CALCIUM 600-D PO) Take 1 tablet by mouth 2 (two) times daily.   Yes [provider]  carvedilol (COREG) 25 MG tablet Take 1 tablet (25 mg total) by mouth 2 (two) times daily. 05/08/21 08/06/21 Yes Evans Lance, MD  Cholecalciferol (VITAMIN D3 PO) Take 1 tablet by mouth 2 (two) times daily.   Yes [provider]  cholestyramine (QUESTRAN) 4 g packet Take 1 packet by mouth daily. 06/05/21  Yes [provider]  denosumab (PROLIA) 60 MG/ML SOLN injection Inject 60 mg into the skin every 6 (six) months. Administer in upper arm, thigh, or abdomen   Yes [provider]  diltiazem (CARDIZEM CD) 120 MG 24 hr capsule Take 1 capsule (120 mg total) by mouth daily. 03/20/21  Yes Evans Lance, MD  dorzolamide-timolol (COSOPT) 22.3-6.8 MG/ML ophthalmic solution Place 1 drop into both eyes 2 (two) times daily.   Yes [provider]  esomeprazole (NEXIUM) 20 MG capsule Take 20 mg by mouth daily at 12 noon.   Yes [provider]  gabapentin (NEURONTIN) 100 MG capsule Take 1 capsule (100 mg total) by mouth 2 (two) times daily. 11/21/20  Yes Annita Brod, MD  guaiFENesin-dextromethorphan (ROBITUSSIN DM) 100-10 MG/5ML syrup Take 10 mLs by mouth every 8 (eight) hours. Patient taking differently: Take 10 mLs by mouth every 8 (eight) hours as needed for cough. 05/30/21  Yes Emokpae, Courage, MD  hydrochlorothiazide (HYDRODIURIL) 12.5 MG tablet Take 1 tablet (12.5 mg total) by mouth daily. 06/05/21  Yes Emokpae, Courage, MD  insulin degludec (TRESIBA FLEXTOUCH) 100 UNIT/ML FlexTouch Pen Inject 10-25 Units into the skin at bedtime. Based on CBG   Yes [provider]  insulin lispro (HUMALOG) 100 UNIT/ML KwikPen Inject  8-14 Units into the skin daily with lunch. Per sliding scale   Yes [provider]  latanoprost (XALATAN) 0.005 % ophthalmic solution Place 1 drop into both eyes at bedtime. 08/23/20  Yes [provider]  lisinopril (ZESTRIL) 40 MG tablet Take 1 tablet (40 mg total) by mouth daily. 06/05/21  Yes Emokpae, Courage, MD  metFORMIN (GLUCOPHAGE-XR) 500 MG 24 hr tablet Take 1,000 mg by mouth 2 (two) times daily. 06/26/20  Yes [provider]  montelukast (SINGULAIR) 10 MG tablet Take 10 mg by mouth at bedtime.   Yes [provider]  rosuvastatin (CRESTOR) 40 MG tablet  Take 40 mg by mouth at bedtime.   Yes [provider]  vitamin B-12 (CYANOCOBALAMIN) 500 MCG tablet Take 1,000 mcg by mouth daily.   Yes [provider]  albuterol (VENTOLIN HFA) 108 (90 Base) MCG/ACT inhaler Inhale 2 puffs into the lungs every 6 (six) hours as needed for wheezing or shortness of breath (cough). 05/30/21   Roxan Hockey, MD  azithromycin (ZITHROMAX) 500 MG tablet Take 1 tablet (500 mg total) by mouth daily. Patient not taking: Reported on 06/11/2021 05/31/21   Roxan Hockey, MD  PAXLOVID 20 x 150 MG & 10 x 100MG  TBPK Take 2 tablets by mouth in the morning and at bedtime. Starting 8.9.22 05/28/21   [provider]  zinc sulfate 220 (50 Zn) MG capsule Take 1 capsule (220 mg total) by mouth daily. Patient not taking: Reported on 06/11/2021 05/31/21   Roxan Hockey, MD    Allergies    Metformin  Review of Systems   Review of Systems  Respiratory:  Negative for cough and shortness of breath.   Cardiovascular:  Negative for chest pain.  Gastrointestinal:  Positive for vomiting. Negative for abdominal pain and blood in stool.  Genitourinary:  Negative for dysuria.  Neurological:  Positive for weakness and light-headedness. Negative for syncope.  All other systems reviewed and are negative.  Physical Exam Updated Vital Signs BP (!) 141/79   Pulse 94   Temp  99.1 F (37.3 C)   Resp 16   SpO2 98%   Physical Exam Vitals and nursing note reviewed.  Constitutional:      General: She is not in acute distress.    Appearance: She is well-developed. She is not toxic-appearing.  HENT:     Head: Normocephalic and atraumatic.     Mouth/Throat:     Mouth: Mucous membranes are dry.  Eyes:     General:        Right eye: No discharge.        Left eye: No discharge.     Conjunctiva/sclera: Conjunctivae normal.  Cardiovascular:     Rate and Rhythm: Tachycardia present. Rhythm irregular.  Pulmonary:     Effort: Pulmonary effort is normal. No respiratory distress.     Breath sounds: Normal breath sounds. No wheezing, rhonchi or rales.  Abdominal:     General: There is no distension.     Palpations: Abdomen is soft.     Tenderness: There is no abdominal tenderness. There is no guarding or rebound.  Musculoskeletal:     Cervical back: Neck supple.     Right lower leg: No edema.     Left lower leg: No edema.  Skin:    General: Skin is warm and dry.     Findings: No rash.  Neurological:     Mental Status: She is alert.     Comments: Clear speech.   Psychiatric:        Behavior: Behavior normal.    ED Results / Procedures / Treatments   Labs (all labs ordered are listed, but only abnormal results are displayed) Labs Reviewed  COMPREHENSIVE METABOLIC PANEL - Abnormal; Notable for the following components:      Result Value   Potassium 5.2 (*)    Glucose, Bld 159 (*)    BUN 47 (*)    Creatinine, Ser 1.74 (*)    Calcium 10.4 (*)    Total Protein 6.2 (*)    Albumin 3.2 (*)    Alkaline Phosphatase 27 (*)    GFR,  Estimated 29 (*)    All other components within normal limits  CBC WITH DIFFERENTIAL/PLATELET - Abnormal; Notable for the following components:   RBC 3.80 (*)    Hemoglobin 10.2 (*)    HCT 32.5 (*)    All other components within normal limits  I-STAT CHEM 8, ED - Abnormal; Notable for the following components:   Potassium 5.2  (*)    BUN 42 (*)    Creatinine, Ser 1.80 (*)    Glucose, Bld 152 (*)    Hemoglobin 10.2 (*)    HCT 30.0 (*)    All other components within normal limits  SARS CORONAVIRUS 2 (TAT 6-24 HRS)  TSH  LIPASE, BLOOD  URINALYSIS, ROUTINE W REFLEX MICROSCOPIC  MAGNESIUM  MAGNESIUM  COMPREHENSIVE METABOLIC PANEL  CBC    EKG None  Radiology No results found.  Procedures .Critical Care  Date/Time: 06/11/2021 7:48 PM Performed by: Amaryllis Dyke, PA-C Authorized by: Amaryllis Dyke, PA-C    CRITICAL CARE Performed by: Kennith Maes   Total critical care time: 35 minutes  Critical care time was exclusive of separately billable procedures and treating other patients.  Critical care was necessary to treat or prevent imminent or life-threatening deterioration.  Critical care was time spent personally by me on the following activities: development of treatment plan with patient and/or surrogate as well as nursing, discussions with consultants, evaluation of patient's response to treatment, examination of patient, obtaining history from patient or surrogate, ordering and performing treatments and interventions, ordering and review of laboratory studies, ordering and review of radiographic studies, pulse oximetry and re-evaluation of patient's condition.  Medications Ordered in ED Medications  diltiazem (CARDIZEM) 1 mg/mL load via infusion 10 mg (10 mg Intravenous Bolus from Bag 06/11/21 1842)    And  diltiazem (CARDIZEM) 125 mg in dextrose 5% 125 mL (1 mg/mL) infusion (7.5 mg/hr Intravenous Rate/Dose Change 06/11/21 2122)  acetaminophen (TYLENOL) tablet 650 mg (has no administration in time range)    Or  acetaminophen (TYLENOL) suppository 650 mg (has no administration in time range)  apixaban (ELIQUIS) tablet 2.5 mg (2.5 mg Oral Given 06/11/21 2125)  insulin aspart (novoLOG) injection 0-9 Units (has no administration in time range)  sodium chloride 0.9 % bolus 500  mL (0 mLs Intravenous Stopped 06/11/21 2101)    ED Course  I have reviewed the triage vital signs and the nursing notes.  Pertinent labs & imaging results that were available during my care of the patient were reviewed by me and considered in my medical decision making (see chart for details).    MDM Rules/Calculators/A&P                           Patient presents to the ED with complaints of weakness.  In afib w/ RVR on arrival. No chest pain/dyspnea. Mildly dry MM. Labs ordered. Patient's daughter relays her dexcom cannot be x-rayed or it is deactivated, she is concerned about removal unless absolutely necessary- with clear lungs and no respiratory complaints at this time will hold off on CXR.   EKG: Afib w/ RVR rate in the 130s.    Patient w/ hx of persistent afib, will start on diltiazem bolus and drip, anticipate admission, will discuss w/ cardiology to see patient in consultation for further medication recommendations.   17:27: CONSULT: Discussed with cardiologist Dr. Gasper Sells- will see patient in consultation for med recs given patient's afib w/ RVR with hypotension at home. Appreciate  input. Plan to admit to medicine.   Additional history obtained:  Additional history obtained from chart review & nursing note review.   Lab Tests:  I Ordered, reviewed, and interpreted labs, which included:  CBC: Mild anemia similar to prior CMP: BUN and creatinine again elevated compared to labs at discharge during last hospitalization.  Patient is hyperkalemic and hypercalcemic as well as hypoalbuminemic. Lipase is within normal limits. TSH is within normal limits.  ED Course:  Given patient with hyperkalemia and hypercalcemia with elevated BUN we will give small fluid bolus.  19:35: Heart rate in the 90s to low 100s on 5 mg an hour infusion.  Will discuss with medicine for admission.  20:19: CONSULT: Discussed with hospitalist Dr. Velia Meyer- accepts admission.   This is a shared  visit with supervising physician Dr. Vanita Panda who has independently evaluated patient & provided guidance in evaluation/management/disposition, in agreement with care   Portions of this note were generated with Dragon dictation software. Dictation errors may occur despite best attempts at proofreading.  Final Clinical Impression(s) / ED Diagnoses Final diagnoses:  Atrial fibrillation with rapid ventricular response [facility])    Rx / DC Orders ED Discharge Orders     None        Leafy Kindle 06/11/21 2221    Carmin Muskrat, MD 06/12/21 2349

## 2021-06-11 NOTE — Progress Notes (Signed)
Brief note regarding preliminary plan, with full H&P to follow:  87 year old female with history of persistent atrial fibrillation chronically anticoagulated on Eliquis, who is admitted with atrial fibrillation with RVR presenting with complaint of 2 to 3 days of generalized weakness associated with some lightheadedness in the absence of overt presyncope and no syncope, with presenting finding of A fib RVR and associated heart rates in the 150s. started diltiazem drip, with heart rate improving, into the 110s to 120s, while blood pressure tolerating.  Patient asymptomatic at this time.  Will admit for further evaluation management of atrial fibrillation with RVR, occluding further rate control.  EDP discussed the patient's case with on-call cardiology, who will also evaluate the patient.  Continue diltiazem drip.  Close monitoring on telemetry. Check UA.  Continue home Eliquis.     Babs Bertin, DO Hospitalist

## 2021-06-11 NOTE — Telephone Encounter (Signed)
Dr.Taylor spoke with Dr.Settle regarding patient.

## 2021-06-11 NOTE — ED Provider Notes (Signed)
Emergency Medicine Provider Triage Evaluation Note  Kristen Fox , a 87 y.o. female  was evaluated in triage.  Pt complains of vomiting and weakness.  She is accompanied by family who provides additional information. Patient had COVID and strep throat and was admitted earlier in the month.  Patient's family has been in communication with cardiology.  Patient is in A. fib RVR.  She has had 1 episode of vomiting/dry heaving.  There have been concerns that patient's blood pressure has been low..  Review of Systems  Positive: Nausea, vomiting Negative: Fevers  Physical Exam  BP 107/79 (BP Location: Right Arm)   Pulse (!) 52   Temp 99.1 F (37.3 C)   Resp 20   SpO2 99%  Gen:   Awake, appears to feel unwell Resp:  Normal effort  MSK:   Moves extremities without difficulty  Other:  Heart rate is elevated.  Medical Decision Making  Medically screening exam initiated at 4:41 PM.  Appropriate orders placed.  Trena Bajorek was informed that the remainder of the evaluation will be completed by another provider, this initial triage assessment does not replace that evaluation, and the importance of remaining in the ED until their evaluation is complete.  1640 EKG shows concern for rapid heart rate. I spoke with the charge nurse to request her room.  Note: Portions of this report may have been transcribed using voice recognition software. Every effort was made to ensure accuracy; however, inadvertent computerized transcription errors may be present    Ollen Gross 06/11/21 1642    Godfrey Pick, MD 06/12/21 [id number]

## 2021-06-12 ENCOUNTER — Other Ambulatory Visit: Payer: Self-pay

## 2021-06-12 DIAGNOSIS — E860 Dehydration: Secondary | ICD-10-CM | POA: Diagnosis not present

## 2021-06-12 DIAGNOSIS — I10 Essential (primary) hypertension: Secondary | ICD-10-CM | POA: Diagnosis not present

## 2021-06-12 DIAGNOSIS — I4891 Unspecified atrial fibrillation: Secondary | ICD-10-CM | POA: Diagnosis not present

## 2021-06-12 DIAGNOSIS — N179 Acute kidney failure, unspecified: Secondary | ICD-10-CM | POA: Diagnosis not present

## 2021-06-12 LAB — URINALYSIS, ROUTINE W REFLEX MICROSCOPIC
Bacteria, UA: NONE SEEN
Bilirubin Urine: NEGATIVE
Glucose, UA: NEGATIVE mg/dL
Hgb urine dipstick: NEGATIVE
Ketones, ur: NEGATIVE mg/dL
Nitrite: NEGATIVE
Protein, ur: NEGATIVE mg/dL
Specific Gravity, Urine: 1.008 (ref 1.005–1.030)
pH: 7 (ref 5.0–8.0)

## 2021-06-12 LAB — CBC
HCT: 31.8 % — ABNORMAL LOW (ref 36.0–46.0)
Hemoglobin: 10.1 g/dL — ABNORMAL LOW (ref 12.0–15.0)
MCH: 27.2 pg (ref 26.0–34.0)
MCHC: 31.8 g/dL (ref 30.0–36.0)
MCV: 85.7 fL (ref 80.0–100.0)
Platelets: 201 10*3/uL (ref 150–400)
RBC: 3.71 MIL/uL — ABNORMAL LOW (ref 3.87–5.11)
RDW: 14.6 % (ref 11.5–15.5)
WBC: 6.7 10*3/uL (ref 4.0–10.5)
nRBC: 0 % (ref 0.0–0.2)

## 2021-06-12 LAB — GLUCOSE, CAPILLARY
Glucose-Capillary: 257 mg/dL — ABNORMAL HIGH (ref 70–99)
Glucose-Capillary: 289 mg/dL — ABNORMAL HIGH (ref 70–99)
Glucose-Capillary: 209 mg/dL — ABNORMAL HIGH (ref 70–99)

## 2021-06-12 LAB — COMPREHENSIVE METABOLIC PANEL
ALT: 20 U/L (ref 0–44)
AST: 27 U/L (ref 15–41)
Albumin: 3.1 g/dL — ABNORMAL LOW (ref 3.5–5.0)
Alkaline Phosphatase: 27 U/L — ABNORMAL LOW (ref 38–126)
Anion gap: 8 (ref 5–15)
BUN: 41 mg/dL — ABNORMAL HIGH (ref 8–23)
CO2: 24 mmol/L (ref 22–32)
Calcium: 10 mg/dL (ref 8.9–10.3)
Chloride: 106 mmol/L (ref 98–111)
Creatinine, Ser: 1.44 mg/dL — ABNORMAL HIGH (ref 0.44–1.00)
GFR, Estimated: 36 mL/min — ABNORMAL LOW (ref >60)
Glucose, Bld: 141 mg/dL — ABNORMAL HIGH (ref 70–99)
Potassium: 4.6 mmol/L (ref 3.5–5.1)
Sodium: 138 mmol/L (ref 135–145)
Total Bilirubin: 0.6 mg/dL (ref 0.3–1.2)
Total Protein: 6 g/dL — ABNORMAL LOW (ref 6.5–8.1)

## 2021-06-12 LAB — SARS CORONAVIRUS 2 (TAT 6-24 HRS): SARS Coronavirus 2: POSITIVE — AB

## 2021-06-12 LAB — MAGNESIUM: Magnesium: 1.3 mg/dL — ABNORMAL LOW (ref 1.7–2.4)

## 2021-06-12 LAB — SODIUM, URINE, RANDOM: Sodium, Ur: 46 mmol/L

## 2021-06-12 LAB — CBG MONITORING, ED: Glucose-Capillary: 132 mg/dL — ABNORMAL HIGH (ref 70–99)

## 2021-06-12 LAB — CREATININE, URINE, RANDOM: Creatinine, Urine: 45.85 mg/dL

## 2021-06-12 MED ORDER — MAGNESIUM SULFATE 2 GM/50ML IV SOLN
2.0000 g | Freq: Once | INTRAVENOUS | Status: AC
  Administered 2021-06-12: 2 g via INTRAVENOUS
  Filled 2021-06-12: qty 50

## 2021-06-12 MED ORDER — SODIUM CHLORIDE 0.9 % IV SOLN: INTRAVENOUS | Status: AC

## 2021-06-12 MED ORDER — DILTIAZEM HCL 60 MG PO TABS
60.0000 mg | ORAL_TABLET | Freq: Four times a day (QID) | ORAL | Status: DC
  Administered 2021-06-12 – 2021-06-13 (×3): 60 mg via ORAL
  Filled 2021-06-12 (×3): qty 1

## 2021-06-12 NOTE — Plan of Care (Signed)

## 2021-06-12 NOTE — ED Notes (Signed)
Pt refusing xray due to it "messing with her dexcom".

## 2021-06-12 NOTE — ED Notes (Signed)
En route to 3E, pt's heart rate went down to the 60s. Titrated cardizem to 2.5. Report given to charge nurse that received the patient.

## 2021-06-12 NOTE — Progress Notes (Addendum)
Progress Note  Patient Name: Kristen Fox Date of Encounter: 06/12/2021  [facility] Cardiologist: Cristopher Peru, MD   Subjective   Feeling better this am although still weak.  HR improved on Cardizem gtt at 7.5mg /hr  Inpatient Medications    Scheduled Meds:  apixaban  2.5 mg Oral BID   brimonidine  1 drop Both Eyes TID   dorzolamide-timolol  1 drop Both Eyes BID   insulin aspart  0-9 Units Subcutaneous TID WC   insulin glargine-yfgn  5 Units Subcutaneous QHS   montelukast  10 mg Oral QHS   pantoprazole  40 mg Oral Daily   rosuvastatin  40 mg Oral QHS   Continuous Infusions:  diltiazem (CARDIZEM) infusion 2.5 mg/hr (06/12/21 1155)   magnesium sulfate bolus IVPB     PRN Meds: acetaminophen **OR** acetaminophen   Vital Signs    Vitals:   06/12/21 1230 06/12/21 1300 06/12/21 1400 06/12/21 1500  BP:  131/64 (!) 122/95 (!) 128/59  Pulse:      Resp: 20  20 19   Temp:      SpO2:   99% 98%  Height: 5\' 5"  (1.651 m)       Intake/Output Summary (Last 24 hours) at 06/12/2021 1557 Last data filed at 06/11/2021 2101 Gross per 24 hour  Intake 500 ml  Output --  Net 500 ml   Last 3 Weights 05/29/2021 03/20/2021 12/14/2020  Weight (lbs) 162 lb 4.1 oz 162 lb 3.2 oz 166 lb  Weight (kg) 73.6 kg 73.573 kg 75.297 kg      Telemetry    Atrial fibrillation with occasional ventricular paced beat - Personally Reviewed  ECG    No further EKG to review - Personally Reviewed  Physical Exam   GEN: No acute distress.   Neck: No JVD Cardiac: irregularly irregular no murmurs, rubs, or gallops.  Respiratory: Clear to auscultation bilaterally. GI: Soft, nontender, non-distended  MS: No edema; No deformity. Neuro:  Nonfocal  Psych: Normal affect   Labs    High Sensitivity Troponin:  No results for input(s): TROPONINIHS in the last 720 hours.    Chemistry Recent Labs  Lab 06/11/21 1830 06/11/21 1840 06/12/21 0242  NA 136 135 138  K 5.2* 5.2* 4.6  CL 103 103 106  CO2 23   --  24  GLUCOSE 159* 152* 141*  BUN 47* 42* 41*  CREATININE 1.74* 1.80* 1.44*  CALCIUM 10.4*  --  10.0  PROT 6.2*  --  6.0*  ALBUMIN 3.2*  --  3.1*  AST 32  --  27  ALT 24  --  20  ALKPHOS 27*  --  27*  BILITOT 0.6  --  0.6  GFRNONAA 29*  --  36*  ANIONGAP 10  --  8     Hematology Recent Labs  Lab 06/11/21 1830 06/11/21 1840 06/12/21 0242  WBC 6.4  --  6.7  RBC 3.80*  --  3.71*  HGB 10.2* 10.2* 10.1*  HCT 32.5* 30.0* 31.8*  MCV 85.5  --  85.7  MCH 26.8  --  27.2  MCHC 31.4  --  31.8  RDW 14.8  --  14.6  PLT 195  --  201    BNPNo results for input(s): BNP, PROBNP in the last 168 hours.   DDimer No results for input(s): DDIMER in the last 168 hours.   CHA2DS2-VASc Score = 5  This indicates a 7.2% annual risk of stroke. The patient's score is based upon: CHF History: No  HTN History: Yes Diabetes History: Yes Stroke History: No Vascular Disease History: No Age Score: 2 Gender Score: 1    Radiology    No results found.  Cardiac Studies   2D echo 11/2020 IMPRESSIONS    1. Left ventricular ejection fraction, by estimation, is 60 to 65%. The  left ventricle has normal function. The left ventricle has no regional  wall motion abnormalities. There is mild left ventricular hypertrophy.  Left ventricular diastolic parameters  are indeterminate.   2. Right ventricular systolic function is mildly reduced. The right  ventricular size is normal. There is mildly elevated pulmonary artery  systolic pressure. The estimated right ventricular systolic pressure is  86.5 mmHg.   3. The mitral valve is normal in structure. Trivial mitral valve  regurgitation.   4. The aortic valve is tricuspid. Aortic valve regurgitation is not  visualized. Mild aortic valve sclerosis is present, with no evidence of  aortic valve stenosis.   5. The inferior vena cava is normal in size with greater than 50%  respiratory variability, suggesting right atrial pressure of 3 mmHg.    Patient Profile     87 y.o. female  with a hx of persistent atrial fibrillation on eliquis, HTN, HLD, DM, CKD who is being seen 06/11/2021 for the evaluation of Afib RVR at the request of Dr. Vanita Panda.  Assessment & Plan    Afib RVR/Persistent Afib  - burden historically 20% -SSS with [employer] PPM in place -HR currently controlled in the 80's on IV Cardizem gtt at 7.5mg /hr -will stop Cardizem IV and start Cardizem PO 60mg  q6 hours (she was on Cardizem CD 120mg  at home) -stopped Carvedilol and if further HR control needed then would place back on Lopressor -TSH normal and K+ repleated -continue Eliquis 2.5mg  BID  Hypertension - recent COVID and strep throat - Afib RVR with hypotension at home related to poor PO intake, dehydration and decrease in carvedilol dose - home regimen includes: coreg 25 mg BID, 120 mg cardizem 12.5 mg HCTZ, and 40 mg lisinopril - hold lisinopril and coreg - restarting Cardizem at higher dose - will interrogate her PPM to determine Afib burden and rate control - discussed that we will focus on rate control for now and Dr. Lovena Le can address afib burden as outpt - pt is not hypotensive here, nevertheless we can transition back to metoprolol for better rate control if needed and no further Carvedilol   Chronic anticoagulation - appropriately on 2.5 mg eliquis BID   CKD IV - baseline creatinine unclear but was 1.42 at discharge on 05/30/21 - 1.8 on admit and down to 1.44 today -continue to hold ACE I   Hyperlipidemia - continue statin    I have spent a total of 35 minutes with patient reviewing 2D echo , telemetry, EKGs, labs and examining patient as well as establishing an assessment and plan that was discussed with the patient.  > 50% of time was spent in direct patient care.     For questions or updates, please contact [place] Please consult [email or web address] for contact info under        Signed, Fransico Him, MD  06/12/2021, 3:57 PM

## 2021-06-12 NOTE — Progress Notes (Signed)
PROGRESS NOTE  Kristen Fox:[id number] DOB: Feb 06, 1934 DOA: 06/11/2021 PCP: Josem Kaufmann, MD   LOS: 1 day   Brief narrative:  Kristen Fox is a 87 y.o. female with medical history significant for persistent atrial fibrillation chronically anticoagulated on Eliquis, hypertension, type 2 diabetes mellitus, stage IIIb chronic kidney disease associated with baseline creatinine 1.4-1.6, was admitted to hospital for generalized weakness and was noted to have atrial fibrillation with RVR.  Patient did have recent COVID infection more than 10 days back.  PCP had contacted the patient's outpatient cardiology Dr. Lovena Le who recommended to come to the ED for further evaluation and treatment. In the ED, patient was noted to have atrial fibrillation with RVR.  Patient was started on Cardizem drip.  Assessment/Plan:  Principal Problem:   Atrial fibrillation with RVR (HCC) Active Problems:   Hypertension   Diabetes mellitus ([place])   Persistent atrial fibrillation (HCC)   AKI (acute kidney injury) ([place])   Hypercalcemia   Dehydration   Atrial fibrillation with RVR:  Cardiology on board.  Currently on Cardizem drip.  Recent 2D echocardiogram on February 2022 and demonstrated LVEF 60 to 65%, no focal motion abnormalities, and no significant valvular pathology. In the setting of a CHA2DS2-VASc score of 7, there is an indication for the patient to be on chronic anticoagulation for thromboembolic prophylaxis.  Continue IV magnesium sulfate.  Coreg on hold.  Continue Eliquis for anticoagulation.  Patient does have a pacemaker which will need to be interrogated as per cardiology.     Generalized weakness:  Continue to monitor closely.  Replenish IV fluids.  Physical therapy has been discontinued.      Acute kidney injury superimposed on stage IIIb chronic kidney disease 3B:  Creatinine of 1.4.    Likely secondary to volume depletion.  Continue IV fluids.   Hypomagnesemia. Will replace with IV  magnesium sulfate.  Potassium level of 4.6..    Recent COVID-19 infection:  More than 10 days ago.  No need for isolation precautions.    Hypercalcemia:  adjusted calcium level of 11.0 on presentation.  Possibly from volume depletion.  Patient was also on calcium carbonate and vitamin D from outpatient.  Hold HCTZ.  Hold vitamin D and calcium carbonate.  Calcium level has improved at this time.    Essential hypertension:  Patient is on Coreg, Cardizem, HCTZ and lisinopril from home.  Currently on Cardizem drip.  Cardiology on board.     Type 2 diabetes mellitus:  Continue long-acting and sliding scale insulin.  Diabetic diet.   Hyperlipidemia:  On statin.    DVT prophylaxis: apixaban (ELIQUIS) tablet 2.5 mg Start: 06/11/21 2200 SCDs Start: 06/11/21 2039 apixaban (ELIQUIS) tablet 2.5 mg    Code Status: Full code  Family Communication: None  Status is: Inpatient  Remains inpatient appropriate because:Unsafe d/c plan, IV treatments appropriate due to intensity of illness or inability to take PO, and Inpatient level of care appropriate due to severity of illness  Dispo: The patient is from: Home              Anticipated d/c is to: Home              Patient currently is not medically stable to d/c.   Difficult to place patient No   Consultants: Cardiology  Procedures: None  Anti-infectives:  None  Anti-infectives (From admission, onward)    None      Subjective: Today, patient was seen and examined at bedside.  Patient states that  she feels little short winded today, has less dyspnea.  Denies chest pain  Objective: Vitals:   06/12/21 1300 06/12/21 1400  BP: 131/64 (!) 122/95  Pulse:    Resp:  20  Temp:    SpO2:  99%    Intake/Output Summary (Last 24 hours) at 06/12/2021 1422 Last data filed at 06/11/2021 2101 Gross per 24 hour  Intake 500 ml  Output --  Net 500 ml   There were no vitals filed for this visit. There is no height or weight on file to  calculate BMI.   Physical Exam: GENERAL: Patient is alert awake and oriented x1.  Not in obvious distress. HENT: No scleral pallor or icterus. Pupils equally reactive to light. Oral mucosa is moist NECK: is supple, no gross swelling noted. CHEST: Diminished breath sounds bilaterally CVS: S1 and S2 heard, no murmur.  Irregularly irregular rhythm with tachycardia ABDOMEN: Soft, non-tender, bowel sounds are present. EXTREMITIES: No edema. CNS: Cranial nerves are intact. No focal motor deficits. SKIN: warm and dry without rashes.  Data Review: I have personally reviewed the following laboratory data and studies,  CBC: Recent Labs  Lab 06/11/21 1830 06/11/21 1840 06/12/21 0242  WBC 6.4  --  6.7  NEUTROABS 3.4  --   --   HGB 10.2* 10.2* 10.1*  HCT 32.5* 30.0* 31.8*  MCV 85.5  --  85.7  PLT 195  --  417   Basic Metabolic Panel: Recent Labs  Lab 06/11/21 1830 06/11/21 1840 06/12/21 0242  NA 136 135 138  K 5.2* 5.2* 4.6  CL 103 103 106  CO2 23  --  24  GLUCOSE 159* 152* 141*  BUN 47* 42* 41*  CREATININE 1.74* 1.80* 1.44*  CALCIUM 10.4*  --  10.0  MG  --   --  1.3*   Liver Function Tests: Recent Labs  Lab 06/11/21 1830 06/12/21 0242  AST 32 27  ALT 24 20  ALKPHOS 27* 27*  BILITOT 0.6 0.6  PROT 6.2* 6.0*  ALBUMIN 3.2* 3.1*   Recent Labs  Lab 06/11/21 1830  LIPASE 44   No results for input(s): AMMONIA in the last 168 hours. Cardiac Enzymes: No results for input(s): CKTOTAL, CKMB, CKMBINDEX, TROPONINI in the last 168 hours. BNP (last 3 results) No results for input(s): BNP in the last 8760 hours.  ProBNP (last 3 results) No results for input(s): PROBNP in the last 8760 hours.  CBG: Recent Labs  Lab 06/11/21 2225 06/12/21 0818 06/12/21 1305  GLUCAP 156* 132* 257*   Recent Results (from the past 240 hour(s))  SARS CORONAVIRUS 2 (TAT 6-24 HRS) Nasopharyngeal Nasopharyngeal Swab     Status: Abnormal   Collection Time: 06/11/21  9:29 PM   Specimen:  Nasopharyngeal Swab  Result Value Ref Range Status   SARS Coronavirus 2 POSITIVE (A) NEGATIVE Final    Comment: (NOTE) SARS-CoV-2 target nucleic acids are DETECTED.  The SARS-CoV-2 RNA is generally detectable in upper and lower respiratory specimens during the acute phase of infection. Positive results are indicative of the presence of SARS-CoV-2 RNA. Clinical correlation with patient history and other diagnostic information is  necessary to determine patient infection status. Positive results do not rule out bacterial infection or co-infection with other viruses.  The expected result is Negative.  Fact Sheet for Patients: SugarRoll.be  Fact Sheet for Healthcare Providers: https://www.woods-mathews.com/  This test is not yet approved or cleared by the [place] FDA and  has been authorized for detection and/or diagnosis of  SARS-CoV-2 by FDA under an Emergency Use Authorization (EUA). This EUA will remain  in effect (meaning this test can be used) for the duration of the COVID-19 declaration under Section 564(b)(1) of the Act, 21 U. S.C. section 360bbb-3(b)(1), unless the authorization is terminated or revoked sooner.   Performed at [facility], [place] [address], [place], [place] [address]      Studies: No results found.    Flora Lipps, MD  [facility] 06/12/2021  If 7PM-7AM, please contact night-coverage

## 2021-06-13 DIAGNOSIS — I10 Essential (primary) hypertension: Secondary | ICD-10-CM | POA: Diagnosis not present

## 2021-06-13 DIAGNOSIS — N179 Acute kidney failure, unspecified: Secondary | ICD-10-CM | POA: Diagnosis not present

## 2021-06-13 DIAGNOSIS — I4891 Unspecified atrial fibrillation: Secondary | ICD-10-CM | POA: Diagnosis not present

## 2021-06-13 DIAGNOSIS — E860 Dehydration: Secondary | ICD-10-CM | POA: Diagnosis not present

## 2021-06-13 LAB — GLUCOSE, CAPILLARY
Glucose-Capillary: 182 mg/dL — ABNORMAL HIGH (ref 70–99)
Glucose-Capillary: 231 mg/dL — ABNORMAL HIGH (ref 70–99)

## 2021-06-13 MED ORDER — DILTIAZEM HCL ER COATED BEADS 240 MG PO CP24
240.0000 mg | ORAL_CAPSULE | Freq: Every day | ORAL | Status: DC
  Administered 2021-06-13: 240 mg via ORAL
  Filled 2021-06-13: qty 1

## 2021-06-13 MED ORDER — CARVEDILOL 6.25 MG PO TABS: 6.2500 mg | ORAL_TABLET | Freq: Two times a day (BID) | ORAL | Status: DC

## 2021-06-13 MED ORDER — CARVEDILOL 6.25 MG PO TABS: 6.2500 mg | ORAL_TABLET | Freq: Two times a day (BID) | ORAL | 0 refills | Status: DC

## 2021-06-13 MED ORDER — DILTIAZEM HCL ER COATED BEADS 240 MG PO CP24: 240.0000 mg | ORAL_CAPSULE | Freq: Every day | ORAL | 2 refills | Status: DC

## 2021-06-13 NOTE — TOC Transition Note (Addendum)
Transition of Care [facility]) - CM/SW Discharge Note   Patient Details  Name: Kristen Fox MRN: [id number] Date of Birth: [birth year]  Transition of [facility] CM/SW Contact:  Zenon Mayo, RN Phone Number: 06/13/2021, 1:06 PM   Clinical Narrative:    NCM spoke with patient and daughter at bedside, she states she was suppose to have [facility] set up a while back but no one came.  NCM offered choice, she states [facility] is ok since she lives in [place].  NCM notified MD need HHPT order.  She has transportation home today.  NCM will need to fax the information to [facility] to make sure they can take the referral. NCM spoke with Kathlee Nations she states the soc will be next Wednesday.     Final next level of care: [place] Barriers to Discharge: No Barriers Identified   Patient Goals and CMS Choice Patient states their goals for this hospitalization and ongoing recovery are:: return home with [facility] CMS Medicare.gov Compare Post Acute Care list provided to:: Patient Choice offered to / list presented to : Patient  Discharge Placement                       Discharge Plan and Services                  DME Agency: NA       HH Arranged: PT [place] Agency: [place] Date [place]: 06/13/21 Time [place]: 4128 Representative spoke with at [place]: [place] ([place]) Interventions     Readmission Risk Interventions No flowsheet data found.

## 2021-06-13 NOTE — Evaluation (Signed)
Physical Therapy Evaluation Patient Details Name: Kristen Fox MRN: [id number] DOB: 1933-11-08 Today's Date: 06/13/2021   History of Present Illness  Pt is an 87 y.o. female admitted 06/11/21 with c/o generalized weakness; workup for afib with RVR. Of note, recent admission to [facility] (8/10-8/11/22) for COVID-19. Other PMH includes CKD IV, HTN, afib on Eliquis, DM, HLD.   Clinical Impression  Pt presents with an overall decrease in functional mobility secondary to above. PTA, pt mod indep with RW, daughter has been available to assist with ADLs/iADLs since recent admission for COVID-19; prior to this, pt independent, active, drives, mows lawn. Today, pt ambulatory with RW and supervision for safety; limited by c/o fatigue and DOE 2-3/4. Pt would benefit from continued acute PT services to maximize functional mobility and independence prior to d/c with HHPT services.   HR 81-118 SpO2 100% on RA    Follow Up Recommendations Home health PT;Supervision - Intermittent    Equipment Recommendations  None recommended by PT    Recommendations for Other Services       Precautions / Restrictions Precautions Precautions: Fall Restrictions Weight Bearing Restrictions: No      Mobility  Bed Mobility Overal bed mobility: Modified Independent             General bed mobility comments: HOB elevated    Transfers Overall transfer level: Modified independent Equipment used: Rolling walker (2 wheeled) Transfers: Sit to/from Stand           General transfer comment: Able to stand from EOB and low toilet height mod indep with RW  Ambulation/Gait Ambulation/Gait assistance: Supervision Gait Distance (Feet): 40 Feet Assistive device: Rolling walker (2 wheeled) Gait Pattern/deviations: Step-through pattern;Decreased stride length Gait velocity: Decreased   General Gait Details: Slow, steady gait with RW and supervision for safety; pt with DOE 2-3/4, endorses fatigue after ADL  tasks  Stairs            Wheelchair Mobility    Modified Rankin (Stroke Patients Only)       Balance Overall balance assessment: Needs assistance   Sitting balance-Leahy Scale: Good       Standing balance-Leahy Scale: Fair Standing balance comment: can static stand without UE support; single UE support for pericare while standing                             Pertinent Vitals/Pain Pain Assessment: No/denies pain Pain Intervention(s): Monitored during session    [place] expects to be discharged to:: Private residence Living Arrangements: Alone Available Help at Discharge: Family;Available 24 hours/day Type of Home: House Home Access: Ramped entrance     Home Layout: Two level;Laundry or work area in basement;Able to live on main level with bedroom/bathroom Home Equipment: Environmental consultant - 2 wheels Additional Comments: Daughter has been in and out staying with patient since recent admission; available for 24/7 support as needed    Prior Function Level of Independence: Independent         Comments: Prior to initial admission 3 wks prior, pt independent without DME, drives, mows lawn. Since admission beginning of 05/2021, pt has been home, ambulating with RW, intermittent assist from daughter for ADL/iADLs, limited by weakness/fatigue     Hand Dominance   Dominant Hand: Right    Extremity/Trunk Assessment   Upper Extremity Assessment Upper Extremity Assessment: Overall WFL for tasks assessed    Lower Extremity Assessment Lower Extremity Assessment: Generalized weakness  Communication   Communication: No difficulties  Cognition Arousal/Alertness: Awake/alert Behavior During Therapy: WFL for tasks assessed/performed Overall Cognitive Status: Within Functional Limits for tasks assessed                                        General Comments General comments (skin integrity, edema, etc.): HR 81-118 (afib),  SpO2 100% on RA    Exercises     Assessment/Plan    PT Assessment Patient needs continued PT services  PT Problem List Decreased activity tolerance;Decreased balance;Cardiopulmonary status limiting activity;Decreased strength;Decreased mobility       PT Treatment Interventions DME instruction;Gait training;Functional mobility training;Therapeutic activities;Therapeutic exercise;Balance training;Patient/family education    PT Goals (Current goals can be found in the Care Plan section)  Acute Rehab PT Goals Patient Stated Goal: home with daughter to assist and HHPT PT Goal Formulation: With patient Time For Goal Achievement: 06/27/21 Potential to Achieve Goals: Good    Frequency Min 3X/week   Barriers to discharge        Co-evaluation               AM-PAC PT "6 Clicks" Mobility  Outcome Measure Help needed turning from your back to your side while in a flat bed without using bedrails?: None Help needed moving from lying on your back to sitting on the side of a flat bed without using bedrails?: None Help needed moving to and from a bed to a chair (including a wheelchair)?: None Help needed standing up from a chair using your arms (e.g., wheelchair or bedside chair)?: None Help needed to walk in hospital room?: A Little Help needed climbing 3-5 steps with a railing? : A Little 6 Click Score: 22    End of Session   Activity Tolerance: Patient tolerated treatment well Patient left: in chair;with call bell/phone within reach;with chair alarm set Nurse Communication: Mobility status PT Visit Diagnosis: Other abnormalities of gait and mobility (R26.89)    Time: 5300-5110 PT Time Calculation (min) (ACUTE ONLY): 26 min   Charges:   PT Evaluation $PT Eval Low Complexity: [place], PT, DPT Acute Rehabilitation Services  Pager [phone] Office [place] 06/13/2021, 9:51 AM

## 2021-06-13 NOTE — Plan of Care (Signed)
  Problem: Education: Goal: Knowledge of General Education information will improve Description: Including pain rating scale, medication(s)/side effects and non-pharmacologic comfort measures Outcome: Adequate for Discharge   Problem: Health Behavior/Discharge Planning: Goal: Ability to manage health-related needs will improve Outcome: Adequate for Discharge   Problem: Clinical Measurements: Goal: Ability to maintain clinical measurements within normal limits will improve Outcome: Adequate for Discharge Goal: Will remain free from infection Outcome: Adequate for Discharge Goal: Diagnostic test results will improve Outcome: Adequate for Discharge Goal: Respiratory complications will improve Outcome: Adequate for Discharge Goal: Cardiovascular complication will be avoided Outcome: Adequate for Discharge   Problem: Activity: Goal: Risk for activity intolerance will decrease Outcome: Adequate for Discharge   Problem: Nutrition: Goal: Adequate nutrition will be maintained Outcome: Adequate for Discharge   Problem: Coping: Goal: Level of anxiety will decrease Outcome: Adequate for Discharge   Problem: Elimination: Goal: Will not experience complications related to bowel motility Outcome: Adequate for Discharge Goal: Will not experience complications related to urinary retention Outcome: Adequate for Discharge   Problem: Pain Managment: Goal: General experience of comfort will improve Outcome: Adequate for Discharge   Problem: Safety: Goal: Ability to remain free from injury will improve Outcome: Adequate for Discharge   Problem: Skin Integrity: Goal: Risk for impaired skin integrity will decrease Outcome: Adequate for Discharge   Problem: Education: Goal: Knowledge of disease or condition will improve Outcome: Adequate for Discharge Goal: Understanding of medication regimen will improve Outcome: Adequate for Discharge Goal: Individualized Educational  Video(s) Outcome: Adequate for Discharge   Problem: Activity: Goal: Ability to tolerate increased activity will improve Outcome: Adequate for Discharge   Problem: Cardiac: Goal: Ability to achieve and maintain adequate cardiopulmonary perfusion will improve Outcome: Adequate for Discharge   Problem: Health Behavior/Discharge Planning: Goal: Ability to safely manage health-related needs after discharge will improve Outcome: Adequate for Discharge   Problem: Acute Rehab PT Goals(only PT should resolve) Goal: Pt Will Ambulate Outcome: Adequate for Discharge

## 2021-06-13 NOTE — Discharge Summary (Signed)
Physician Discharge Summary  Kristen Fox RWE:[id number] DOB: 04/25/1934 DOA: 06/11/2021  PCP: Kristen Kaufmann, MD  Admit date: 06/11/2021 Discharge date: 06/13/2021  Admitted From: Home  Discharge disposition: Home PT  Recommendations for Outpatient Follow-Up:   Follow up with your primary care provider in one week.  Check CBC, BMP, magnesium in the next visit Follow up with the cardiology as outpatient in a week, (office to schedule)  Discharge Diagnosis:   Principal Problem:   Atrial fibrillation with RVR ([place]) Active Problems:   Hypertension   Diabetes mellitus ([place])   Persistent atrial fibrillation ([place])   AKI (acute kidney injury) ([place])   Hypercalcemia   Dehydration   Discharge Condition: Improved.  Diet recommendation: Low sodium, heart healthy.  Carbohydrate-modified.    Wound care: None.  Code status: Full.   History of Present Illness:   Kristen Fox is a 87 y.o. female with medical history significant for persistent atrial fibrillation chronically anticoagulated on Eliquis, hypertension, type 2 diabetes mellitus, stage IIIb chronic kidney disease associated with baseline creatinine 1.4-1.6, was admitted to hospital for generalized weakness and was noted to have atrial fibrillation with RVR.  Patient did have recent COVID infection more than 10 days back.  PCP had contacted the patient's outpatient cardiology Dr. Lovena Le who recommended to come to the ED for further evaluation and treatment. In the ED, patient was noted to have atrial fibrillation with RVR.  Patient was started on Cardizem drip and was admitted to the hospital.  Hospital Course:   Following conditions were addressed during hospitalization as listed below,  Atrial fibrillation with RVR:  Cardiology was consulted.  Initially the patient was on Cardizem drip which was changed to oral Cardizem.  Dose of Cardizem has been increased at this time.  Coreg dose has been decreased.  Recent 2D  echocardiogram on February 2022 and demonstrated LVEF 60 to 65%, no focal motion abnormalities, and no significant valvular pathology.  Pacemaker was interrogated with a high A. fib burden.  At this time patient will follow up with cardiology as outpatient.  Currently heart rate has been controlled.  Patient will continue anticoagulation with Eliquis.    Generalized weakness:  Soft IV fluids.  Heart rate much better controlled.  Physical therapy has seen the patient and recommended home PT on discharge.   Stage IIIb chronic kidney disease  Received gentle IV fluids.  Latest creatinine of 1.4 could be monitored as outpatient   Hypomagnesemia. Replenished with IV magnesium sulfate    Recent COVID-19 infection:  More than 10 days ago.  No need for isolation precautions.    Hypercalcemia:  adjusted calcium level of 11.0 on presentation.  Improved with IV fluids.  Patient was on HCTZ vitamin D and calcium carbonate as outpatient.  Essential hypertension:  Patient is on Coreg, Cardizem, HCTZ and lisinopril from home.  Dose of Cardizem has been increased and Coreg has been decreased at this time.  Type 2 diabetes mellitus:  Continue diabetic diet and metformin and Tresiba at home.   Hyperlipidemia:  Crestor on discharge  Disposition.  At this time, patient is stable for disposition with outpatient PCP and cardiology follow-up.  Spoke with the patient's daughter Ms. Kristen Fox on the phone prior to disposition.  Medical Consultants:   Cardiology  Procedures:    Pacemaker check Subjective:   Today, patient was seen and examined at bedside.  Denies any dizziness, lightheadedness, chest pain, shortness of breath or fever.  Discharge Exam:   Vitals:  06/13/21 0443 06/13/21 1148  BP: 131/82 (!) 125/48  Pulse: 81 72  Resp: 17   Temp: 97.8 F (36.6 C) 98.5 F (36.9 C)  SpO2: 98% 100%   Vitals:   06/12/21 2354 06/13/21 0443 06/13/21 0500 06/13/21 1148  BP: (!) 105/49 131/82  (!)  125/48  Pulse: 75 81  72  Resp: 16 17    Temp: 98 F (36.7 C) 97.8 F (36.6 C)  98.5 F (36.9 C)  TempSrc: Oral Oral  Oral  SpO2: 100% 98%  100%  Weight:   69.8 kg   Height:       General: Alert awake, not in obvious distress HENT: pupils equally reacting to light,  No scleral pallor or icterus noted. Oral mucosa is moist.  Chest: Diminished breath sounds bilaterally.   CVS: S1 &S2 heard. No murmur.  Irregularly irregular rhythm but controlled. Abdomen: Soft, nontender, nondistended.  Bowel sounds are heard.   Extremities: No cyanosis, clubbing or edema.  Peripheral pulses are palpable. Psych: Alert, awake and communicative. CNS:  No cranial nerve deficits.  Power equal in all extremities.   Skin: Warm and dry.  No rashes noted.  The results of significant diagnostics from this hospitalization (including imaging, microbiology, ancillary and laboratory) are listed below for reference.     Diagnostic Studies:   No results found.   Labs:   Basic Metabolic Panel: Recent Labs  Lab 06/11/21 1830 06/11/21 1840 06/12/21 0242  NA 136 135 138  K 5.2* 5.2* 4.6  CL 103 103 106  CO2 23  --  24  GLUCOSE 159* 152* 141*  BUN 47* 42* 41*  CREATININE 1.74* 1.80* 1.44*  CALCIUM 10.4*  --  10.0  MG  --   --  1.3*   GFR Estimated Creatinine Clearance: 28.5 mL/min (A) (by C-G formula based on SCr of 1.44 mg/dL (H)). Liver Function Tests: Recent Labs  Lab 06/11/21 1830 06/12/21 0242  AST 32 27  ALT 24 20  ALKPHOS 27* 27*  BILITOT 0.6 0.6  PROT 6.2* 6.0*  ALBUMIN 3.2* 3.1*   Recent Labs  Lab 06/11/21 1830  LIPASE 44   No results for input(s): AMMONIA in the last 168 hours. Coagulation profile No results for input(s): INR, PROTIME in the last 168 hours.  CBC: Recent Labs  Lab 06/11/21 1830 06/11/21 1840 06/12/21 0242  WBC 6.4  --  6.7  NEUTROABS 3.4  --   --   HGB 10.2* 10.2* 10.1*  HCT 32.5* 30.0* 31.8*  MCV 85.5  --  85.7  PLT 195  --  201   Cardiac  Enzymes: No results for input(s): CKTOTAL, CKMB, CKMBINDEX, TROPONINI in the last 168 hours. BNP: Invalid input(s): POCBNP CBG: Recent Labs  Lab 06/12/21 1305 06/12/21 1613 06/12/21 2122 06/13/21 0622 06/13/21 1157  GLUCAP 257* 289* 209* 182* 231*   D-Dimer No results for input(s): DDIMER in the last 72 hours. Hgb A1c No results for input(s): HGBA1C in the last 72 hours. Lipid Profile No results for input(s): CHOL, HDL, LDLCALC, TRIG, CHOLHDL, LDLDIRECT in the last 72 hours. Thyroid function studies Recent Labs    06/11/21 1830  TSH 2.059   Anemia work up No results for input(s): VITAMINB12, FOLATE, FERRITIN, TIBC, IRON, RETICCTPCT in the last 72 hours. Microbiology Recent Results (from the past 240 hour(s))  SARS CORONAVIRUS 2 (TAT 6-24 HRS) Nasopharyngeal Nasopharyngeal Swab     Status: Abnormal   Collection Time: 06/11/21  9:29 PM   Specimen: Nasopharyngeal Swab  Result Value Ref Range Status   SARS Coronavirus 2 POSITIVE (A) NEGATIVE Final    Comment: (NOTE) SARS-CoV-2 target nucleic acids are DETECTED.  The SARS-CoV-2 RNA is generally detectable in upper and lower respiratory specimens during the acute phase of infection. Positive results are indicative of the presence of SARS-CoV-2 RNA. Clinical correlation with patient history and other diagnostic information is  necessary to determine patient infection status. Positive results do not rule out bacterial infection or co-infection with other viruses.  The expected result is Negative.  Fact Sheet for Patients: SugarRoll.be  Fact Sheet for Healthcare Providers: https://www.woods-mathews.com/  This test is not yet approved or cleared by the [place] FDA and  has been authorized for detection and/or diagnosis of SARS-CoV-2 by FDA under an Emergency Use Authorization (EUA). This EUA will remain  in effect (meaning this test can be used) for the duration of  the COVID-19 declaration under Section 564(b)(1) of the Act, 21 U. S.C. section 360bbb-3(b)(1), unless the authorization is terminated or revoked sooner.   Performed at [facility], [place] [address] [place], [place] [address]      Discharge Instructions:   Discharge Instructions     Diet - low sodium heart healthy   Complete by: As directed    Discharge instructions   Complete by: As directed    Follow-up with cardiology as scheduled by the clinic.  Follow-up with your primary care provider in 1 week.  Dose of Coreg has been changed to 6.25 mg twice a day, Cardizem dose has been increased please take a note.  If you experience worsening symptoms, please seek medical attention.   Increase activity slowly   Complete by: As directed       Allergies as of 06/13/2021       Reactions   Metformin Other (See Comments)   Caused stomach upset (tolerates timed release)        Medication List     STOP taking these medications    azithromycin 500 MG tablet Commonly known as: ZITHROMAX       TAKE these medications    albuterol 108 (90 Base) MCG/ACT inhaler Commonly known as: VENTOLIN HFA Inhale 2 puffs into the lungs every 6 (six) hours as needed for wheezing or shortness of breath (cough).   alclomethasone 0.05 % cream Commonly known as: ACLOVATE Apply topically 2 (two) times daily as needed (Rash).   apixaban 2.5 MG Tabs tablet Commonly known as: ELIQUIS Take 1 tablet (2.5 mg total) by mouth 2 (two) times daily.   ascorbic acid 500 MG tablet Commonly known as: VITAMIN C Take 1 tablet (500 mg total) by mouth daily.   benzocaine-menthol 6-10 MG lozenge Commonly known as: CHLORAEPTIC Take 1 lozenge by mouth as needed for sore throat.   brimonidine 0.2 % ophthalmic solution Commonly known as: ALPHAGAN Place 1 drop into both eyes 3 (three) times daily.   CALCIUM 600-D PO Take 1 tablet by mouth 2 (two) times daily.   carvedilol 6.25 MG tablet Commonly  known as: COREG Take 1 tablet (6.25 mg total) by mouth 2 (two) times daily. What changed:  medication strength how much to take   cholestyramine 4 g packet Commonly known as: QUESTRAN Take 1 packet by mouth daily.   denosumab 60 MG/ML Soln injection Commonly known as: PROLIA Inject 60 mg into the skin every 6 (six) months. Administer in upper arm, thigh, or abdomen   diltiazem 240 MG 24 hr capsule Commonly known as: Cardizem  CD Take 1 capsule (240 mg total) by mouth daily. What changed:  medication strength how much to take   dorzolamide-timolol 22.3-6.8 MG/ML ophthalmic solution Commonly known as: COSOPT Place 1 drop into both eyes 2 (two) times daily.   esomeprazole 20 MG capsule Commonly known as: NEXIUM Take 20 mg by mouth daily at 12 noon.   gabapentin 100 MG capsule Commonly known as: NEURONTIN Take 1 capsule (100 mg total) by mouth 2 (two) times daily.   guaiFENesin-dextromethorphan 100-10 MG/5ML syrup Commonly known as: ROBITUSSIN DM Take 10 mLs by mouth every 8 (eight) hours. What changed:  when to take this reasons to take this   hydrochlorothiazide 12.5 MG tablet Commonly known as: HYDRODIURIL Take 1 tablet (12.5 mg total) by mouth daily.   insulin lispro 100 UNIT/ML KwikPen Commonly known as: HUMALOG Inject 8-14 Units into the skin daily with lunch. Per sliding scale   latanoprost 0.005 % ophthalmic solution Commonly known as: XALATAN Place 1 drop into both eyes at bedtime.   lisinopril 40 MG tablet Commonly known as: ZESTRIL Take 1 tablet (40 mg total) by mouth daily.   metFORMIN 500 MG 24 hr tablet Commonly known as: GLUCOPHAGE-XR Take 1,000 mg by mouth 2 (two) times daily.   montelukast 10 MG tablet Commonly known as: SINGULAIR Take 10 mg by mouth at bedtime.   Paxlovid 20 x 150 MG & 10 x 100MG  Tbpk Generic drug: nirmatrelvir & ritonavir Take 2 tablets by mouth in the morning and at bedtime. Starting 8.9.22   rosuvastatin 40 MG  tablet Commonly known as: CRESTOR Take 40 mg by mouth at bedtime.   Tyler Aas FlexTouch 100 UNIT/ML FlexTouch Pen Generic drug: insulin degludec Inject 10-25 Units into the skin at bedtime. Based on CBG   vitamin B-12 500 MCG tablet Commonly known as: CYANOCOBALAMIN Take 1,000 mcg by mouth daily.   VITAMIN D3 PO Take 1 tablet by mouth 2 (two) times daily.   zinc sulfate 220 (50 Zn) MG capsule Take 1 capsule (220 mg total) by mouth daily.        Follow-up Information     Kristen Kaufmann, MD. Go on 06/25/2021.   Specialty: Family Medicine Why: @11 :[place]ane information: [address] [place] [address] [phone]         Evans Lance, MD .   Specialty: Cardiology Why: Dr. Tanna Furry office will call you to arrange follow-up visit. Contact information: [address] [place] [address] [phone]                  Time coordinating discharge: 39 minutes  Signed:  Gladyes Kudo  [facility] 06/13/2021, 1:25 PM

## 2021-06-13 NOTE — Progress Notes (Addendum)
Progress Note  Patient Name: Kristen Fox Date of Encounter: 06/13/2021  [facility] Cardiologist: Cristopher Peru, MD   Subjective   Remains weak.  Denies any chest pain or SOB.  HR controlled on Cardizem  Inpatient Medications    Scheduled Meds:  apixaban  2.5 mg Oral BID   brimonidine  1 drop Both Eyes TID   diltiazem  60 mg Oral Q6H   dorzolamide-timolol  1 drop Both Eyes BID   insulin aspart  0-9 Units Subcutaneous TID WC   insulin glargine-yfgn  5 Units Subcutaneous QHS   montelukast  10 mg Oral QHS   pantoprazole  40 mg Oral Daily   rosuvastatin  40 mg Oral QHS   Continuous Infusions:   PRN Meds: acetaminophen **OR** acetaminophen   Vital Signs    Vitals:   06/12/21 2007 06/12/21 2354 06/13/21 0443 06/13/21 0500  BP: (!) 141/92 (!) 105/49 131/82   Pulse: 94 75 81   Resp: 20 16 17    Temp: 98.5 F (36.9 C) 98 F (36.7 C) 97.8 F (36.6 C)   TempSrc: Oral Oral Oral   SpO2: 98% 100% 98%   Weight:    69.8 kg  Height:        Intake/Output Summary (Last 24 hours) at 06/13/2021 5631 Last data filed at 06/13/2021 0900 Gross per 24 hour  Intake 685.32 ml  Output 1150 ml  Net -464.68 ml    Last 3 Weights 06/13/2021 06/12/2021 05/29/2021  Weight (lbs) 153 lb 12.8 oz 156 lb 4.9 oz 162 lb 4.1 oz  Weight (kg) 69.763 kg 70.9 kg 73.6 kg      Telemetry  Atrial fibrillation with occasionally V pacing Personally Reviewed  ECG    No new EKG to review- Personally Reviewed  Physical Exam   GEN: Well nourished, well developed in no acute distress HEENT: Normal NECK: No JVD; No carotid bruits LYMPHATICS: No lymphadenopathy CARDIAC:irregularly irregular, no murmurs, rubs, gallops RESPIRATORY:  Clear to auscultation without rales, wheezing or rhonchi  ABDOMEN: Soft, non-tender, non-distended MUSCULOSKELETAL:  No edema; No deformity  SKIN: Warm and dry NEUROLOGIC:  Alert and oriented x 3 PSYCHIATRIC:  Normal affect    Labs    High Sensitivity Troponin:  No  results for input(s): TROPONINIHS in the last 720 hours.    Chemistry Recent Labs  Lab 06/11/21 1830 06/11/21 1840 06/12/21 0242  NA 136 135 138  K 5.2* 5.2* 4.6  CL 103 103 106  CO2 23  --  24  GLUCOSE 159* 152* 141*  BUN 47* 42* 41*  CREATININE 1.74* 1.80* 1.44*  CALCIUM 10.4*  --  10.0  PROT 6.2*  --  6.0*  ALBUMIN 3.2*  --  3.1*  AST 32  --  27  ALT 24  --  20  ALKPHOS 27*  --  27*  BILITOT 0.6  --  0.6  GFRNONAA 29*  --  36*  ANIONGAP 10  --  8      Hematology Recent Labs  Lab 06/11/21 1830 06/11/21 1840 06/12/21 0242  WBC 6.4  --  6.7  RBC 3.80*  --  3.71*  HGB 10.2* 10.2* 10.1*  HCT 32.5* 30.0* 31.8*  MCV 85.5  --  85.7  MCH 26.8  --  27.2  MCHC 31.4  --  31.8  RDW 14.8  --  14.6  PLT 195  --  201     BNPNo results for input(s): BNP, PROBNP in the last 168 hours.  DDimer No results for input(s): DDIMER in the last 168 hours.   CHA2DS2-VASc Score = 5  This indicates a 7.2% annual risk of stroke. The patient's score is based upon: CHF History: No HTN History: Yes Diabetes History: Yes Stroke History: No Vascular Disease History: No Age Score: 2 Gender Score: 1    Radiology    No results found.  Cardiac Studies   2D echo 11/2020 IMPRESSIONS    1. Left ventricular ejection fraction, by estimation, is 60 to 65%. The  left ventricle has normal function. The left ventricle has no regional  wall motion abnormalities. There is mild left ventricular hypertrophy.  Left ventricular diastolic parameters  are indeterminate.   2. Right ventricular systolic function is mildly reduced. The right  ventricular size is normal. There is mildly elevated pulmonary artery  systolic pressure. The estimated right ventricular systolic pressure is  42.5 mmHg.   3. The mitral valve is normal in structure. Trivial mitral valve  regurgitation.   4. The aortic valve is tricuspid. Aortic valve regurgitation is not  visualized. Mild aortic valve sclerosis is  present, with no evidence of  aortic valve stenosis.   5. The inferior vena cava is normal in size with greater than 50%  respiratory variability, suggesting right atrial pressure of 3 mmHg.   Patient Profile     87 y.o. female  with a hx of persistent atrial fibrillation on eliquis, HTN, HLD, DM, CKD who is being seen 06/11/2021 for the evaluation of Afib RVR at the request of Dr. Vanita Panda.  Assessment & Plan    Afib RVR/Persistent Afib  - burden historically 20% - SSS with [employer] PPM in place - HR currently controlled in the 80's on PO Cardizem - change Cardizem PO 60mg  q6 hours to Cardizem CD 240mg  daily - stopped Carvedilol due to low BP but now BP is elevated so will restart at 6.25mg  BID and titrate as needed for BP control -TSH normal and K+ repleated - continue Eliquis 2.5mg  BID  Hypertension - BP now trending up and was 151/126mmhg yesterday  - recent COVID and strep throat patient developed hypotension from poor PO intake - Afib RVR with hypotension at home related to poor PO intake, dehydration and decrease in carvedilol dose - home regimen included: coreg 25 mg BID, 120 mg cardizem 12.5 mg HCTZ, and 40 mg lisinopril - Lisinopril held due to AKI - Carvedilol held due to hypotension - now BP trending upwards - Changing short acting Cardizem to Cardizem CD 240mg  daily - restart Carvedilol 6.25mg  BID and titrate for elevated BP - discussed that we will focus on rate control for now and Dr. Lovena Le can address afib burden as outpt   Chronic anticoagulation - appropriately on 2.5 mg eliquis BID   CKD IV - baseline creatinine unclear but was 1.42 at discharge on 05/30/21 - 1.8 on admit and down to 1.44 today -continue to hold ACE I   Hyperlipidemia - continue statin  [facility] will sign off.   Medication Recommendations:  Eliquis 2.5mg  BID, Cardizem CD 240mg  daily.  Restarting Carvedilol at 6.25mg  BID but final discharge dose to be determined by [facility] based on BP  at discharge Other recommendations (labs, testing, etc):  [facility] in 1 week Follow up as an outpatient:  Dr. Lovena Le in 1-2 weeks     I have spent a total of 35 minutes with patient reviewing 2D echo , telemetry, EKGs, labs and examining patient as well as establishing an assessment and  plan that was discussed with the patient.  > 50% of time was spent in direct patient care.     For questions or updates, please contact [place] Please consult [email or web address] for contact info under        Signed, Fransico Him, MD  06/13/2021, 9:24 AM

## 2021-06-13 NOTE — Progress Notes (Signed)
Dr. Radford Pax requested arranging f/u 1 week with Dr. Lovena Le - msg sent to EP scheduler to arrange f/u with [facility].

## 2021-06-15 NOTE — Progress Notes (Signed)
Remote pacemaker transmission.   

## 2021-06-20 LAB — 25-HYDROXY VITAMIN D LCMS D2+D3
25-Hydroxy, Vitamin D-2: 3.5 ng/mL
25-Hydroxy, Vitamin D-3: 72 ng/mL
25-Hydroxy, Vitamin D: 76 ng/mL

## 2021-06-26 NOTE — Progress Notes (Signed)
Electrophysiology Office Note Date: 06/27/2021  ID:  Kristen Fox, Kristen Fox 10-17-1934, MRN [id number]  PCP: Josem Kaufmann, MD Primary Cardiologist: Cristopher Peru, MD Electrophysiologist: Cristopher Peru, MD   CC: Pacemaker follow-up  Kristen Fox is a 87 y.o. female seen today for Cristopher Peru, MD for post hospital follow up.    Admitted 8/23 - 8/25 with generalized weakness and rapid HRs at home in setting of recent COVID diagnosis. Presented to ED in AF RVR with hypotension and AKI.   Since discharge from hospital the patient reports doing better gradually. By device, she has been back in NSR for about a week. She remains fatigued. No undue SOB. BP and HRs have been stable for past week. Long discussion with daughter over phone about getting through to triage and how best to handle concerns in the future, including sending manual transmission and calling office for resting HRs > 100.   Device History: Medtronic Dual Chamber PPM implanted 10/2020 for SND  Past Medical History:  Diagnosis Date   Asthma due to environmental allergies    Basal cell carcinoma 05/08/2020   bcc ant. mid neck    Breast cancer ([place])    2003   Cataract    Chronic kidney disease    Clotting disorder ([place])    Diabetes mellitus ([place])    type II   Diabetic coma ([place])    GERD (gastroesophageal reflux disease)    Glaucoma    Hyperlipidemia    Hypertension    Osteoporosis    Pneumonia    Pulmonary embolism ([place])    Squamous cell carcinoma of skin 10/18/2014   well diff-right upper arm cx3 36fu   Squamous cell carcinoma of skin 06/02/2019   in situ-upper lip (txpbx)   Tubular adenoma of colon 2017   Past Surgical History:  Procedure Laterality Date   APPENDECTOMY     CATARACT EXTRACTION, BILATERAL     MASTECTOMY     left w/ymph node removal   PACEMAKER IMPLANT N/A 11/19/2020   Procedure: PACEMAKER IMPLANT;  Surgeon: Deboraha Sprang, MD;  Location: [facility];  Service: Cardiovascular;   Laterality: N/A;   SKIN CANCER EXCISION     TONSILLECTOMY     VAGINAL HYSTERECTOMY  1981    Current Outpatient Medications  Medication Sig Dispense Refill   albuterol (VENTOLIN HFA) 108 (90 Base) MCG/ACT inhaler Inhale 2 puffs into the lungs every 6 (six) hours as needed for wheezing or shortness of breath (cough). 18 g 1   alclomethasone (ACLOVATE) 0.05 % cream Apply topically 2 (two) times daily as needed (Rash). 180 g 3   apixaban (ELIQUIS) 2.5 MG TABS tablet Take 1 tablet (2.5 mg total) by mouth 2 (two) times daily. 60 tablet 0   ascorbic acid (VITAMIN C) 500 MG tablet Take 1 tablet (500 mg total) by mouth daily. 30 tablet 3   benzocaine-menthol (CHLORAEPTIC) 6-10 MG lozenge Take 1 lozenge by mouth as needed for sore throat. 18 tablet 0   brimonidine (ALPHAGAN) 0.2 % ophthalmic solution Place 1 drop into both eyes 3 (three) times daily.     Calcium Carb-Cholecalciferol (CALCIUM 600-D PO) Take 1 tablet by mouth 2 (two) times daily.     carvedilol (COREG) 6.25 MG tablet Take 1 tablet (6.25 mg total) by mouth 2 (two) times daily. 180 tablet 0   Cholecalciferol (VITAMIN D3 PO) Take 1 tablet by mouth 2 (two) times daily.     cholestyramine (QUESTRAN) 4 g packet Take 1  packet by mouth daily.     denosumab (PROLIA) 60 MG/ML SOLN injection Inject 60 mg into the skin every 6 (six) months. Administer in upper arm, thigh, or abdomen     diltiazem (CARDIZEM CD) 240 MG 24 hr capsule Take 1 capsule (240 mg total) by mouth daily. 30 capsule 2   dorzolamide-timolol (COSOPT) 22.3-6.8 MG/ML ophthalmic solution Place 1 drop into both eyes 2 (two) times daily.     esomeprazole (NEXIUM) 20 MG capsule Take 20 mg by mouth daily at 12 noon.     gabapentin (NEURONTIN) 100 MG capsule Take 1 capsule (100 mg total) by mouth 2 (two) times daily. 60 capsule 1   hydrochlorothiazide (HYDRODIURIL) 12.5 MG tablet Take 1 tablet (12.5 mg total) by mouth daily. 30 tablet 1   insulin degludec (TRESIBA FLEXTOUCH) 100 UNIT/ML  FlexTouch Pen Inject 10-25 Units into the skin at bedtime. Based on CBG     insulin lispro (HUMALOG) 100 UNIT/ML KwikPen Inject 8-14 Units into the skin daily with lunch. Per sliding scale     latanoprost (XALATAN) 0.005 % ophthalmic solution Place 1 drop into both eyes at bedtime.     lisinopril (ZESTRIL) 40 MG tablet Take 1 tablet (40 mg total) by mouth daily. 30 tablet 2   metFORMIN (GLUCOPHAGE-XR) 500 MG 24 hr tablet Take 1,000 mg by mouth 2 (two) times daily.     montelukast (SINGULAIR) 10 MG tablet Take 10 mg by mouth at bedtime.     rosuvastatin (CRESTOR) 40 MG tablet Take 40 mg by mouth at bedtime.     vitamin B-12 (CYANOCOBALAMIN) 500 MCG tablet Take 1,000 mcg by mouth daily.     No current facility-administered medications for this visit.    Allergies:   Metformin   Social History: Social History   Socioeconomic History   Marital status: Single    Spouse name: Not on file   Number of children: Not on file   Years of education: Not on file   Highest education level: Not on file  Occupational History   Not on file  Tobacco Use   Smoking status: Never   Smokeless tobacco: Never  Vaping Use   Vaping Use: Never used  Substance and Sexual Activity   Alcohol use: No   Drug use: No   Sexual activity: Not on file  Other Topics Concern   Not on file  Social History Narrative   Lives alone   Social Determinants of Health   Financial Resource Strain: Not on file  Food Insecurity: Not on file  Transportation Needs: Not on file  Physical Activity: Not on file  Stress: Not on file  Social Connections: Not on file  Intimate Partner Violence: Not on file    Family History: Family History  Problem Relation Age of Onset   Diabetes Mother    CVA Mother    Leukemia Father    Colon cancer Neg Hx    Stomach cancer Neg Hx    Pancreatic cancer Neg Hx    Esophageal cancer Neg Hx      Review of Systems: All other systems reviewed and are otherwise negative except as  noted above.  Physical Exam: Vitals:   06/27/21 0946  BP: 138/68  Pulse: 70  SpO2: 96%  Weight: 162 lb (73.5 kg)  Height: 5\' 5"  (1.651 m)     GEN- The patient is well appearing, alert and oriented x 3 today.   HEENT: normocephalic, atraumatic; sclera clear, conjunctiva pink; hearing intact; oropharynx  clear; neck supple  Lungs- Clear to ausculation bilaterally, normal work of breathing.  No wheezes, rales, rhonchi Heart- Regular rate and rhythm, no murmurs, rubs or gallops  GI- soft, non-tender, non-distended, bowel sounds present  Extremities- no clubbing or cyanosis. No edema MS- no significant deformity or atrophy Skin- warm and dry, no rash or lesion; PPM pocket well healed Psych- euthymic mood, full affect Neuro- strength and sensation are intact  PPM Interrogation- reviewed in detail today,  See PACEART report  EKG:  EKG is not ordered today.  Recent Labs: 06/11/2021: TSH 2.059 06/12/2021: ALT 20; BUN 41; Creatinine, Ser 1.44; Hemoglobin 10.1; Magnesium 1.3; Platelets 201; Potassium 4.6; Sodium 138   Wt Readings from Last 3 Encounters:  06/27/21 162 lb (73.5 kg)  06/13/21 153 lb 12.8 oz (69.8 kg)  05/29/21 162 lb 4.1 oz (73.6 kg)     Other studies Reviewed: Additional studies/ records that were reviewed today include: Previous EP office notes, Previous remote checks, Most recent labwork.   Assessment and Plan:  1. SND s/p [employer] PPM  Normal PPM function See Pace Art report No changes today  2. Paroxysmal atrial fibrillation Recent illness complicated by AF with RVR with hypotension She has converted back to NSR for the past week. Will continue to follow burden  She is on Eliquis 2.5 mg BID currently. Her Creatinine fluctuates around the 1.5 mark recently, more chronically, Cr is 1.8 - 2.0. BMET today.  CBC today.   Current medicines are reviewed at length with the patient today.    Labs/ tests ordered today include:  Orders Placed This Encounter   Procedures   Basic metabolic panel   CBC   Disposition:   Follow up with EP APP in 3 Months. Sooner with further issue.    Jacalyn Lefevre, PA-C  06/27/2021 10:14 AM  [facility] [address] [place] [address] [phone] (office) [phone] (fax)

## 2021-06-27 ENCOUNTER — Ambulatory Visit (INDEPENDENT_AMBULATORY_CARE_PROVIDER_SITE_OTHER): Payer: Medicare Other | Admitting: Student

## 2021-06-27 ENCOUNTER — Other Ambulatory Visit: Payer: Self-pay

## 2021-06-27 ENCOUNTER — Encounter: Payer: Self-pay | Admitting: Student

## 2021-06-27 VITALS — BP 138/68 | HR 70 | Ht 65.0 in | Wt 162.0 lb

## 2021-06-27 DIAGNOSIS — I480 Paroxysmal atrial fibrillation: Secondary | ICD-10-CM | POA: Diagnosis not present

## 2021-06-27 DIAGNOSIS — I10 Essential (primary) hypertension: Secondary | ICD-10-CM | POA: Diagnosis not present

## 2021-06-27 DIAGNOSIS — I495 Sick sinus syndrome: Secondary | ICD-10-CM

## 2021-06-27 LAB — CUP PACEART INCLINIC DEVICE CHECK
Battery Remaining Longevity: 168 mo
Battery Voltage: 3.17 V
Brady Statistic AP VP Percent: 0.03 %
Brady Statistic AP VS Percent: 47.4 %
Brady Statistic AS VP Percent: 0.1 %
Brady Statistic AS VS Percent: 52.53 %
Brady Statistic RA Percent Paced: 20.11 %
Brady Statistic RV Percent Paced: 8.03 %
Date Time Interrogation Session: 20220908111810
Implantable Lead Implant Date: 20220131
Implantable Lead Implant Date: 20220131
Implantable Lead Location: 753859
Implantable Lead Location: 753860
Implantable Lead Model: 5076
Implantable Lead Model: 5076
Implantable Pulse Generator Implant Date: 20220131
Lead Channel Impedance Value: 323 Ohm
Lead Channel Impedance Value: 361 Ohm
Lead Channel Impedance Value: 380 Ohm
Lead Channel Impedance Value: 646 Ohm
Lead Channel Pacing Threshold Amplitude: 0.625 V
Lead Channel Pacing Threshold Amplitude: 0.875 V
Lead Channel Pacing Threshold Pulse Width: 0.4 ms
Lead Channel Pacing Threshold Pulse Width: 0.4 ms
Lead Channel Sensing Intrinsic Amplitude: 0.875 mV
Lead Channel Sensing Intrinsic Amplitude: 1.375 mV
Lead Channel Sensing Intrinsic Amplitude: 4.75 mV
Lead Channel Sensing Intrinsic Amplitude: 5.125 mV
Lead Channel Setting Pacing Amplitude: 1.75 V
Lead Channel Setting Pacing Amplitude: 2 V
Lead Channel Setting Pacing Pulse Width: 0.4 ms
Lead Channel Setting Sensing Sensitivity: 0.9 mV

## 2021-06-27 LAB — CBC
Hematocrit: 29.2 % — ABNORMAL LOW (ref 34.0–46.6)
Hemoglobin: 9.2 g/dL — ABNORMAL LOW (ref 11.1–15.9)
MCH: 26.8 pg (ref 26.6–33.0)
MCHC: 31.5 g/dL (ref 31.5–35.7)
MCV: 85 fL (ref 79–97)
Platelets: 243 10*3/uL (ref 150–450)
RBC: 3.43 x10E6/uL — ABNORMAL LOW (ref 3.77–5.28)
RDW: 15.4 % (ref 11.7–15.4)
WBC: 5.6 10*3/uL (ref 3.4–10.8)

## 2021-06-27 LAB — BASIC METABOLIC PANEL
BUN/Creatinine Ratio: 20 (ref 12–28)
BUN: 29 mg/dL — ABNORMAL HIGH (ref 8–27)
CO2: 22 mmol/L (ref 20–29)
Calcium: 9.9 mg/dL (ref 8.7–10.3)
Chloride: 100 mmol/L (ref 96–106)
Creatinine, Ser: 1.48 mg/dL — ABNORMAL HIGH (ref 0.57–1.00)
Glucose: 139 mg/dL — ABNORMAL HIGH (ref 65–99)
Potassium: 4.6 mmol/L (ref 3.5–5.2)
Sodium: 138 mmol/L (ref 134–144)
eGFR: 35 mL/min/{1.73_m2} — ABNORMAL LOW (ref >59)

## 2021-06-27 NOTE — Patient Instructions (Signed)
Medication Instructions:  Your physician recommends that you continue on your current medications as directed. Please refer to the Current Medication list given to you today.  *If you need a refill on your cardiac medications before your next appointment, please call your pharmacy*   Lab Work: TODAY: BMET, CBC  If you have labs (blood work) drawn today and your tests are completely normal, you will receive your results only by: [place] (if you have MyChart) OR A paper copy in the mail If you have any lab test that is abnormal or we need to change your treatment, we will call you to review the results.   Follow-Up: At [facility], you and your health needs are our priority.  As part of our continuing mission to provide you with exceptional heart care, we have created designated Provider Care Teams.  These Care Teams include your primary Cardiologist (physician) and Advanced Practice Providers (APPs -  Physician Assistants and Nurse Practitioners) who all work together to provide you with the care you need, when you need it.  Your next appointment:   09/26/2021 with Oda Kilts, PA-C

## 2021-07-16 ENCOUNTER — Telehealth: Payer: Self-pay | Admitting: Physician Assistant

## 2021-07-16 NOTE — Telephone Encounter (Signed)
Reports lesion on left medial wrist, raised area, sore when touched. Nurse, Estill Bamberg , phone is [phone]. She's already scheduled with [facility] on 08/08/21. This is a Pharmacist, hospital

## 2021-07-16 NOTE — Telephone Encounter (Deleted)
Reports lesion on left medial wrist, raised area, sore when touched. Nurse, Estill Bamberg , phone is [phone].

## 2021-08-08 ENCOUNTER — Ambulatory Visit: Payer: Medicare Other | Admitting: Physician Assistant

## 2021-08-15 ENCOUNTER — Ambulatory Visit (INDEPENDENT_AMBULATORY_CARE_PROVIDER_SITE_OTHER): Payer: Medicare Other | Admitting: Physician Assistant

## 2021-08-15 ENCOUNTER — Other Ambulatory Visit: Payer: Self-pay

## 2021-08-15 DIAGNOSIS — Z85828 Personal history of other malignant neoplasm of skin: Secondary | ICD-10-CM

## 2021-08-15 DIAGNOSIS — D485 Neoplasm of uncertain behavior of skin: Secondary | ICD-10-CM

## 2021-08-15 DIAGNOSIS — C44629 Squamous cell carcinoma of skin of left upper limb, including shoulder: Secondary | ICD-10-CM | POA: Diagnosis not present

## 2021-08-15 NOTE — Patient Instructions (Signed)

## 2021-08-19 ENCOUNTER — Encounter: Payer: Self-pay | Admitting: Physician Assistant

## 2021-08-19 NOTE — Progress Notes (Signed)
   Follow-Up Visit   Subjective  Kristen Fox is a 87 y.o. female who presents for the following: Follow-up (Here to have left wrist looked at. X 2 months. History of non mole skin cancers. ).   The following portions of the chart were reviewed this encounter and updated as appropriate:  Tobacco  Allergies  Meds  Problems  Med Hx  Surg Hx  Fam Hx      Objective  Well appearing patient in no apparent distress; mood and affect are within normal limits.  A focused examination was performed including face and arms. Relevant physical exam findings are noted in the Assessment and Plan.  Left Forearm - Posterior [place] growth.       Assessment & Plan  SCC (squamous cell carcinoma), arm, left Left Forearm - Posterior  Skin / nail biopsy Type of biopsy: tangential   Informed consent: discussed and consent obtained   Timeout: patient name, date of birth, surgical site, and procedure verified   Anesthesia: the lesion was anesthetized in a standard fashion   Anesthetic:  1% lidocaine w/ epinephrine 1-100,000 local infiltration Instrument used: flexible razor blade   Hemostasis achieved with: aluminum chloride and electrodesiccation   Outcome: patient tolerated procedure well   Post-procedure details: wound care instructions given    Destruction of lesion Complexity: simple   Destruction method: electrodesiccation and curettage   Informed consent: discussed and consent obtained   Timeout:  patient name, date of birth, surgical site, and procedure verified Anesthesia: the lesion was anesthetized in a standard fashion   Anesthetic:  1% lidocaine w/ epinephrine 1-100,000 local infiltration Curettage performed in three different directions: Yes   Curettage cycles:  3 Margin per side (cm):  0.1 Final wound size (cm):  1.5 Hemostasis achieved with:  aluminum chloride Outcome: patient tolerated procedure well with no complications   Post-procedure details: wound care  instructions given    Specimen 1 - Surgical pathology Differential Diagnosis: bcc scc tx with bx   Check Margins: No    I, Arias Weinert, PA-C, have reviewed all documentation's for this visit.  The documentation on 08/19/21 for the exam, diagnosis, procedures and orders are all accurate and complete.

## 2021-08-20 ENCOUNTER — Ambulatory Visit (INDEPENDENT_AMBULATORY_CARE_PROVIDER_SITE_OTHER): Payer: Medicare Other

## 2021-08-20 DIAGNOSIS — I495 Sick sinus syndrome: Secondary | ICD-10-CM

## 2021-08-20 LAB — CUP PACEART REMOTE DEVICE CHECK
Battery Remaining Longevity: 165 mo
Battery Voltage: 3.15 V
Brady Statistic AP VP Percent: 0.05 %
Brady Statistic AP VS Percent: 82.09 %
Brady Statistic AS VP Percent: 0.02 %
Brady Statistic AS VS Percent: 17.84 %
Brady Statistic RA Percent Paced: 82.17 %
Brady Statistic RV Percent Paced: 0.07 %
Date Time Interrogation Session: 20221101002936
Implantable Lead Implant Date: 20220131
Implantable Lead Implant Date: 20220131
Implantable Lead Location: 753859
Implantable Lead Location: 753860
Implantable Lead Model: 5076
Implantable Lead Model: 5076
Implantable Pulse Generator Implant Date: 20220131
Lead Channel Impedance Value: 304 Ohm
Lead Channel Impedance Value: 304 Ohm
Lead Channel Impedance Value: 342 Ohm
Lead Channel Impedance Value: 532 Ohm
Lead Channel Pacing Threshold Amplitude: 0.625 V
Lead Channel Pacing Threshold Amplitude: 0.75 V
Lead Channel Pacing Threshold Pulse Width: 0.4 ms
Lead Channel Pacing Threshold Pulse Width: 0.4 ms
Lead Channel Sensing Intrinsic Amplitude: 0.875 mV
Lead Channel Sensing Intrinsic Amplitude: 0.875 mV
Lead Channel Sensing Intrinsic Amplitude: 4.5 mV
Lead Channel Sensing Intrinsic Amplitude: 4.5 mV
Lead Channel Setting Pacing Amplitude: 1.5 V
Lead Channel Setting Pacing Amplitude: 2 V
Lead Channel Setting Pacing Pulse Width: 0.4 ms
Lead Channel Setting Sensing Sensitivity: 0.9 mV

## 2021-08-27 NOTE — Progress Notes (Signed)
Remote pacemaker transmission.   

## 2021-09-03 ENCOUNTER — Ambulatory Visit (INDEPENDENT_AMBULATORY_CARE_PROVIDER_SITE_OTHER): Payer: Medicare Other | Admitting: Physician Assistant

## 2021-09-03 ENCOUNTER — Other Ambulatory Visit: Payer: Self-pay

## 2021-09-03 ENCOUNTER — Encounter: Payer: Self-pay | Admitting: Physician Assistant

## 2021-09-03 DIAGNOSIS — D0439 Carcinoma in situ of skin of other parts of face: Secondary | ICD-10-CM | POA: Diagnosis not present

## 2021-09-03 DIAGNOSIS — C44629 Squamous cell carcinoma of skin of left upper limb, including shoulder: Secondary | ICD-10-CM

## 2021-09-03 DIAGNOSIS — L570 Actinic keratosis: Secondary | ICD-10-CM | POA: Diagnosis not present

## 2021-09-03 DIAGNOSIS — D485 Neoplasm of uncertain behavior of skin: Secondary | ICD-10-CM

## 2021-09-03 DIAGNOSIS — D0430 Carcinoma in situ of skin of unspecified part of face: Secondary | ICD-10-CM

## 2021-09-03 DIAGNOSIS — D099 Carcinoma in situ, unspecified: Secondary | ICD-10-CM

## 2021-09-03 NOTE — Patient Instructions (Addendum)
Wynonia Sours, MD SPECIALTIES Orthopaedics Hand and Upper Extremity Clinical Adjunct Faculty, Orthopaedic Surgery Physician 4.7 [phone] ratings) Insurance Accepted [place], [place] [address] [phone]     Biopsy, Surgery (Curettage) & Surgery (Excision) Aftercare Instructions  1. Okay to remove bandage in 24 hours  2. Wash area with soap and water  3. Apply Vaseline to area twice daily until healed (Not Neosporin)  4. Okay to cover with a Band-Aid to decrease the chance of infection or prevent irritation from clothing; also it's okay to uncover lesion at home.  5. Suture instructions: return to our office in 7-10 or 10-14 days for a nurse visit for suture removal. Variable healing with sutures, if pain or itching occurs call our office. It's okay to shower or bathe 24 hours after sutures are given.  6. The following risks may occur after a biopsy, curettage or excision: bleeding, scarring, discoloration, recurrence, infection (redness, yellow drainage, pain or swelling).  7. For questions, concerns and results call our office at [place] before 4pm & Friday before 3pm. Biopsy results will be available in 1 week.

## 2021-09-03 NOTE — Progress Notes (Addendum)
Follow-Up Visit   Subjective  Kristen Fox is a 87 y.o. female who presents for the following: Procedure (Here for treatment- left forearm-posterior & left zygomatic area- scc x 2).   The following portions of the chart were reviewed this encounter and updated as appropriate:  Tobacco  Allergies  Meds  Problems  Med Hx  Surg Hx  Fam Hx      Objective  Well appearing patient in no apparent distress; mood and affect are within normal limits.  All skin waist up examined.  Left Zygomatic Area Pink macule  Left Forearm - Posterior Pink macule  Left Upper Cutaneous Lip Erythematous patches with gritty scale.  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  Destruction of lesion Complexity: simple   Destruction method: electrodesiccation and curettage   Informed consent: discussed and consent obtained   Timeout:  patient name, date of birth, surgical site, and procedure verified Anesthesia: the lesion was anesthetized in a standard fashion   Anesthetic:  1% lidocaine w/ epinephrine 1-100,000 local infiltration Curettage performed in three different directions: Yes   Electrodesiccation performed over the curetted area: Yes   Curettage cycles:  3 Final wound size (cm):  1.2 Hemostasis achieved with:  ferric subsulfate Outcome: patient tolerated procedure well with no complications   Additional details:  Wound innoculated with 5 fluorouracil solution.  Squamous cell carcinoma of skin of left upper limb, including shoulder Left Forearm - Posterior  Destruction of lesion Complexity: simple   Destruction method: electrodesiccation and curettage   Informed consent: discussed and consent obtained   Timeout:  patient name, date of birth, surgical site, and procedure verified Anesthesia: the lesion was anesthetized in a standard fashion   Anesthetic:  1% lidocaine w/ epinephrine  1-100,000 local infiltration Curettage performed in three different directions: Yes   Electrodesiccation performed over the curetted area: Yes   Curettage cycles:  3 Final wound size (cm):  1.2 Hemostasis achieved with:  ferric subsulfate Outcome: patient tolerated procedure well with no complications   Additional details:  Wound innoculated with 5 fluorouracil solution.  AK (actinic keratosis) Left Upper Cutaneous Lip  Destruction of lesion - Left Upper Cutaneous Lip Complexity: simple   Destruction method: cryotherapy   Informed consent: discussed and consent obtained   Timeout:  patient name, date of birth, surgical site, and procedure verified Lesion destroyed using liquid nitrogen: Yes   Cryotherapy cycles:  3 Outcome: patient tolerated procedure well with no complications    Carcinoma in situ of skin of face, unspecified location Left Malar Cheek  Skin / nail biopsy Type of biopsy: tangential   Informed consent: discussed and consent obtained   Timeout: patient name, date of birth, surgical site, and procedure verified   Procedure prep:  Patient was prepped and draped in usual sterile fashion (Non sterile) Prep type:  Chlorhexidine Anesthesia: the lesion was anesthetized in a standard fashion   Anesthetic:  1% lidocaine w/ epinephrine 1-100,000 local infiltration Instrument used: flexible razor blade   Outcome: patient tolerated procedure well   Post-procedure details: wound care instructions given    Destruction of lesion Complexity: simple   Destruction method: electrodesiccation and curettage   Informed consent: discussed and consent obtained   Timeout:  patient name, date of birth, surgical site, and procedure verified Anesthesia: the lesion was anesthetized in a standard fashion   Anesthetic:  1% lidocaine w/  epinephrine 1-100,000 local infiltration Curettage performed in three different directions: Yes   Electrodesiccation performed over the curetted area: Yes    Curettage cycles:  3 Margin per side (cm):  0.1 Final wound size (cm):  1 Hemostasis achieved with:  aluminum chloride Outcome: patient tolerated procedure well with no complications   Post-procedure details: wound care instructions given    Specimen 1 - Surgical pathology Differential Diagnosis: bcc vs scc- txpbx  Check Margins: No   I, Kristen Oliveto, PA-C, have reviewed all documentation's for this visit.  The documentation on 09/25/21 for the exam, diagnosis, procedures and orders are all accurate and complete.

## 2021-09-25 NOTE — Progress Notes (Signed)
Electrophysiology Office Note Date: 09/25/2021  ID:  Keaja, Reaume Aug 16, 1934, MRN [id number]  PCP: Josem Kaufmann, MD Primary Cardiologist: Cristopher Peru, MD Electrophysiologist: Cristopher Peru, MD   CC: Pacemaker follow-up  Kristen Fox is a 87 y.o. female seen today for Cristopher Peru, MD for routine electrophysiology followup.  Since last being seen in our clinic the patient reports doing well overall.  she denies chest pain, palpitations, dyspnea, PND, orthopnea, nausea, vomiting, dizziness, syncope, edema, weight gain, or early satiety.  Device History: Medtronic Dual Chamber PPM implanted 10/2020 for SND  Past Medical History:  Diagnosis Date   Asthma due to environmental allergies    Basal cell carcinoma 05/08/2020   bcc ant. mid neck    Breast cancer ([place])    2003   Cataract    Chronic kidney disease    Clotting disorder ([place])    Diabetes mellitus ([place])    type II   Diabetic coma ([place])    GERD (gastroesophageal reflux disease)    Glaucoma    Hyperlipidemia    Hypertension    Osteoporosis    Pneumonia    Pulmonary embolism (HCC)    SCC (squamous cell carcinoma) 05/08/2021   well diff- left forearm-posterior (CX35FU)   SCC (squamous cell carcinoma) 05/08/2021   in situ- left zygomatic area (CX35FU)   Squamous cell carcinoma of skin 10/18/2014   well diff-right upper arm cx3 40fu   Squamous cell carcinoma of skin 06/02/2019   in situ-upper lip (txpbx)   Tubular adenoma of colon 2017   Past Surgical History:  Procedure Laterality Date   APPENDECTOMY     CATARACT EXTRACTION, BILATERAL     MASTECTOMY     left w/ymph node removal   PACEMAKER IMPLANT N/A 11/19/2020   Procedure: PACEMAKER IMPLANT;  Surgeon: Deboraha Sprang, MD;  Location: [facility];  Service: Cardiovascular;  Laterality: N/A;   SKIN CANCER EXCISION     TONSILLECTOMY     VAGINAL HYSTERECTOMY  1981    Current Outpatient Medications  Medication Sig Dispense Refill   albuterol  (VENTOLIN HFA) 108 (90 Base) MCG/ACT inhaler Inhale 2 puffs into the lungs every 6 (six) hours as needed for wheezing or shortness of breath (cough). 18 g 1   alclomethasone (ACLOVATE) 0.05 % cream Apply topically 2 (two) times daily as needed (Rash). 180 g 3   apixaban (ELIQUIS) 2.5 MG TABS tablet Take 1 tablet (2.5 mg total) by mouth 2 (two) times daily. 60 tablet 0   ascorbic acid (VITAMIN C) 500 MG tablet Take 1 tablet (500 mg total) by mouth daily. 30 tablet 3   benzocaine-menthol (CHLORAEPTIC) 6-10 MG lozenge Take 1 lozenge by mouth as needed for sore throat. 18 tablet 0   brimonidine (ALPHAGAN) 0.2 % ophthalmic solution Place 1 drop into both eyes 3 (three) times daily.     Calcium Carb-Cholecalciferol (CALCIUM 600-D PO) Take 1 tablet by mouth 2 (two) times daily.     carvedilol (COREG) 6.25 MG tablet Take 1 tablet (6.25 mg total) by mouth 2 (two) times daily. 180 tablet 0   Cholecalciferol (VITAMIN D3 PO) Take 1 tablet by mouth 2 (two) times daily.     cholestyramine (QUESTRAN) 4 g packet Take 1 packet by mouth daily.     denosumab (PROLIA) 60 MG/ML SOLN injection Inject 60 mg into the skin every 6 (six) months. Administer in upper arm, thigh, or abdomen     diltiazem (CARDIZEM CD) 240 MG 24 hr  capsule Take 1 capsule (240 mg total) by mouth daily. 30 capsule 2   dorzolamide-timolol (COSOPT) 22.3-6.8 MG/ML ophthalmic solution Place 1 drop into both eyes 2 (two) times daily.     esomeprazole (NEXIUM) 20 MG capsule Take 20 mg by mouth daily at 12 noon.     gabapentin (NEURONTIN) 100 MG capsule Take 1 capsule (100 mg total) by mouth 2 (two) times daily. 60 capsule 1   hydrochlorothiazide (HYDRODIURIL) 12.5 MG tablet Take 1 tablet (12.5 mg total) by mouth daily. 30 tablet 1   insulin degludec (TRESIBA FLEXTOUCH) 100 UNIT/ML FlexTouch Pen Inject 10-25 Units into the skin at bedtime. Based on CBG     insulin lispro (HUMALOG) 100 UNIT/ML KwikPen Inject 8-14 Units into the skin daily with lunch. Per  sliding scale     latanoprost (XALATAN) 0.005 % ophthalmic solution Place 1 drop into both eyes at bedtime.     lisinopril (ZESTRIL) 40 MG tablet Take 1 tablet (40 mg total) by mouth daily. 30 tablet 2   metFORMIN (GLUCOPHAGE-XR) 500 MG 24 hr tablet Take 1,000 mg by mouth 2 (two) times daily.     montelukast (SINGULAIR) 10 MG tablet Take 10 mg by mouth at bedtime.     rosuvastatin (CRESTOR) 40 MG tablet Take 40 mg by mouth at bedtime.     vitamin B-12 (CYANOCOBALAMIN) 500 MCG tablet Take 1,000 mcg by mouth daily.     No current facility-administered medications for this visit.    Allergies:   Metformin   Social History: Social History   Socioeconomic History   Marital status: Single    Spouse name: Not on file   Number of children: Not on file   Years of education: Not on file   Highest education level: Not on file  Occupational History   Not on file  Tobacco Use   Smoking status: Never   Smokeless tobacco: Never  Vaping Use   Vaping Use: Never used  Substance and Sexual Activity   Alcohol use: No   Drug use: No   Sexual activity: Not on file  Other Topics Concern   Not on file  Social History Narrative   Lives alone   Social Determinants of Health   Financial Resource Strain: Not on file  Food Insecurity: Not on file  Transportation Needs: Not on file  Physical Activity: Not on file  Stress: Not on file  Social Connections: Not on file  Intimate Partner Violence: Not on file    Family History: Family History  Problem Relation Age of Onset   Diabetes Mother    CVA Mother    Leukemia Father    Colon cancer Neg Hx    Stomach cancer Neg Hx    Pancreatic cancer Neg Hx    Esophageal cancer Neg Hx      Review of Systems: All other systems reviewed and are otherwise negative except as noted above.  Physical Exam: There were no vitals filed for this visit.   GEN- The patient is well appearing, alert and oriented x 3 today.   HEENT: normocephalic,  atraumatic; sclera clear, conjunctiva pink; hearing intact; oropharynx clear; neck supple  Lungs- Clear to ausculation bilaterally, normal work of breathing.  No wheezes, rales, rhonchi Heart- Regular rate and rhythm, no murmurs, rubs or gallops  GI- soft, non-tender, non-distended, bowel sounds present  Extremities- no clubbing or cyanosis. No edema MS- no significant deformity or atrophy Skin- warm and dry, no rash or lesion; PPM pocket well healed  Psych- euthymic mood, full affect Neuro- strength and sensation are intact  PPM Interrogation- reviewed in detail today,  See PACEART report  EKG:  EKG is not ordered today.  Recent Labs: 06/11/2021: TSH 2.059 06/12/2021: ALT 20; Magnesium 1.3 06/27/2021: BUN 29; Creatinine, Ser 1.48; Hemoglobin 9.2; Platelets 243; Potassium 4.6; Sodium 138   Wt Readings from Last 3 Encounters:  06/27/21 162 lb (73.5 kg)  06/13/21 153 lb 12.8 oz (69.8 kg)  05/29/21 162 lb 4.1 oz (73.6 kg)     Other studies Reviewed: Additional studies/ records that were reviewed today include: Previous EP office notes, Previous remote checks, Most recent labwork.   Assessment and Plan:  1. SND s/p Medtronic PPM  Normal PPM function See Pace Art report No changes today  2. Paroxysmal atrial fibrillation Recent illness complicated by AF with RVR with hypotension 0% burden since last check Recent labs stable and PCP visit next week.  Continue Eliquis 2.5 mg BID ( Age, Cr)  Current medicines are reviewed at length with the patient today.    Disposition:   Follow up with Dr. Lovena Le in [place] 6 months   Signed, Shirley Friar, PA-C  09/25/2021 1:55 PM  [place] [address] [place] [address] [phone] (office) [phone] (fax)

## 2021-09-26 ENCOUNTER — Other Ambulatory Visit: Payer: Self-pay

## 2021-09-26 ENCOUNTER — Encounter: Payer: Self-pay | Admitting: Student

## 2021-09-26 ENCOUNTER — Ambulatory Visit (INDEPENDENT_AMBULATORY_CARE_PROVIDER_SITE_OTHER): Payer: Medicare Other | Admitting: Student

## 2021-09-26 VITALS — BP 120/56 | HR 70 | Ht 65.0 in | Wt 161.4 lb

## 2021-09-26 DIAGNOSIS — I495 Sick sinus syndrome: Secondary | ICD-10-CM | POA: Diagnosis not present

## 2021-09-26 DIAGNOSIS — I480 Paroxysmal atrial fibrillation: Secondary | ICD-10-CM

## 2021-09-26 LAB — CUP PACEART INCLINIC DEVICE CHECK
Battery Remaining Longevity: 160 mo
Battery Voltage: 3.14 V
Brady Statistic AP VP Percent: 0.05 %
Brady Statistic AP VS Percent: 81.64 %
Brady Statistic AS VP Percent: 0.02 %
Brady Statistic AS VS Percent: 18.29 %
Brady Statistic RA Percent Paced: 81.73 %
Brady Statistic RV Percent Paced: 0.07 %
Date Time Interrogation Session: 20221208102141
Implantable Lead Implant Date: 20220131
Implantable Lead Implant Date: 20220131
Implantable Lead Location: 753859
Implantable Lead Location: 753860
Implantable Lead Model: 5076
Implantable Lead Model: 5076
Implantable Pulse Generator Implant Date: 20220131
Lead Channel Impedance Value: 323 Ohm
Lead Channel Impedance Value: 361 Ohm
Lead Channel Impedance Value: 361 Ohm
Lead Channel Impedance Value: 551 Ohm
Lead Channel Pacing Threshold Amplitude: 0.625 V
Lead Channel Pacing Threshold Amplitude: 1 V
Lead Channel Pacing Threshold Pulse Width: 0.4 ms
Lead Channel Pacing Threshold Pulse Width: 0.4 ms
Lead Channel Sensing Intrinsic Amplitude: 0.875 mV
Lead Channel Sensing Intrinsic Amplitude: 1 mV
Lead Channel Sensing Intrinsic Amplitude: 4.375 mV
Lead Channel Sensing Intrinsic Amplitude: 4.625 mV
Lead Channel Setting Pacing Amplitude: 2 V
Lead Channel Setting Pacing Amplitude: 2 V
Lead Channel Setting Pacing Pulse Width: 0.4 ms
Lead Channel Setting Sensing Sensitivity: 0.9 mV

## 2021-09-26 NOTE — Patient Instructions (Signed)
Medication Instructions:  Your physician recommends that you continue on your current medications as directed. Please refer to the Current Medication list given to you today.  *If you need a refill on your cardiac medications before your next appointment, please call your pharmacy*   Lab Work: None If you have labs (blood work) drawn today and your tests are completely normal, you will receive your results only by: MyChart Message (if you have MyChart) OR A paper copy in the mail If you have any lab test that is abnormal or we need to change your treatment, we will call you to review the results.   Follow-Up: At [facility], you and your health needs are our priority.  As part of our continuing mission to provide you with exceptional heart care, we have created designated Provider Care Teams.  These Care Teams include your primary Cardiologist (physician) and Advanced Practice Providers (APPs -  Physician Assistants and Nurse Practitioners) who all work together to provide you with the care you need, when you need it.   Your next appointment:   6 month(s)  The format for your next appointment:   In Person  Provider:   You may see Gregg Taylor, MD or one of the following Advanced Practice Providers on your designated Care Team:   Renee Ursuy, PA-C Michael "Andy" Tillery, PA-C    

## 2021-11-19 ENCOUNTER — Ambulatory Visit (INDEPENDENT_AMBULATORY_CARE_PROVIDER_SITE_OTHER): Payer: Medicare Other

## 2021-11-19 DIAGNOSIS — I495 Sick sinus syndrome: Secondary | ICD-10-CM

## 2021-11-19 LAB — CUP PACEART REMOTE DEVICE CHECK
Battery Remaining Longevity: 160 mo
Battery Voltage: 3.11 V
Brady Statistic AP VP Percent: 0.05 %
Brady Statistic AP VS Percent: 87.76 %
Brady Statistic AS VP Percent: 0.03 %
Brady Statistic AS VS Percent: 12.16 %
Brady Statistic RA Percent Paced: 80.69 %
Brady Statistic RV Percent Paced: 2.73 %
Date Time Interrogation Session: 20230131032135
Implantable Lead Implant Date: 20220131
Implantable Lead Implant Date: 20220131
Implantable Lead Location: 753859
Implantable Lead Location: 753860
Implantable Lead Model: 5076
Implantable Lead Model: 5076
Implantable Pulse Generator Implant Date: 20220131
Lead Channel Impedance Value: 304 Ohm
Lead Channel Impedance Value: 323 Ohm
Lead Channel Impedance Value: 361 Ohm
Lead Channel Impedance Value: 551 Ohm
Lead Channel Pacing Threshold Amplitude: 0.5 V
Lead Channel Pacing Threshold Amplitude: 0.875 V
Lead Channel Pacing Threshold Pulse Width: 0.4 ms
Lead Channel Pacing Threshold Pulse Width: 0.4 ms
Lead Channel Sensing Intrinsic Amplitude: 0.625 mV
Lead Channel Sensing Intrinsic Amplitude: 0.625 mV
Lead Channel Sensing Intrinsic Amplitude: 5.125 mV
Lead Channel Sensing Intrinsic Amplitude: 5.125 mV
Lead Channel Setting Pacing Amplitude: 1.75 V
Lead Channel Setting Pacing Amplitude: 2 V
Lead Channel Setting Pacing Pulse Width: 0.4 ms
Lead Channel Setting Sensing Sensitivity: 0.9 mV

## 2021-11-27 NOTE — Progress Notes (Signed)
Remote pacemaker transmission.   

## 2022-01-02 ENCOUNTER — Encounter: Payer: Self-pay | Admitting: Physician Assistant

## 2022-01-02 ENCOUNTER — Other Ambulatory Visit: Payer: Self-pay

## 2022-01-02 ENCOUNTER — Ambulatory Visit (INDEPENDENT_AMBULATORY_CARE_PROVIDER_SITE_OTHER): Payer: Medicare Other | Admitting: Physician Assistant

## 2022-01-02 DIAGNOSIS — Z86007 Personal history of in-situ neoplasm of skin: Secondary | ICD-10-CM | POA: Diagnosis not present

## 2022-01-02 DIAGNOSIS — Z1283 Encounter for screening for malignant neoplasm of skin: Secondary | ICD-10-CM | POA: Diagnosis not present

## 2022-01-02 DIAGNOSIS — L570 Actinic keratosis: Secondary | ICD-10-CM | POA: Diagnosis not present

## 2022-01-02 NOTE — Progress Notes (Signed)
? ?  Follow-Up Visit ?  ?Subjective  ?Kristen Fox is a 88 y.o. female who presents for the following: Follow-up (F/u for left zygomatic area, no issues with this spot. Personal history of bcc and scc. ). ? ? ?The following portions of the chart were reviewed this encounter and updated as appropriate:  Tobacco  Allergies  Meds  Problems  Med Hx  Surg Hx  Fam Hx   ?  ? ?Objective  ?Well appearing patient in no apparent distress; mood and affect are within normal limits. ? ?All skin waist up examined. ? ?No signs of non-mole skin cancer. No atypical nevi  ? ?Left Zygomatic Area ?Clear scar ? ?Mid Forehead ?Erythematous patches with gritty scale. ? ? ?Assessment & Plan  ?History of squamous cell carcinoma in situ (SCCIS) ?Left Zygomatic Area ? ?Yearly skin exams ? ?Actinic keratosis ?Mid Forehead ? ?Will use klisyri when samples come in for diffuse actinic damage.  ? ?Encounter for screening for malignant neoplasm of skin ? ?Yearly skin examination  ? ? ?No atypical nevi noted at the time of the visit. ? ?I, Judene Logue, PA-C, have reviewed all documentation's for this visit.  The documentation on 01/02/22 for the exam, diagnosis, procedures and orders are all accurate and complete. ?

## 2022-02-18 ENCOUNTER — Ambulatory Visit (INDEPENDENT_AMBULATORY_CARE_PROVIDER_SITE_OTHER): Payer: Medicare Other

## 2022-02-18 DIAGNOSIS — I495 Sick sinus syndrome: Secondary | ICD-10-CM | POA: Diagnosis not present

## 2022-02-18 LAB — CUP PACEART REMOTE DEVICE CHECK
Battery Remaining Longevity: 155 mo
Battery Voltage: 3.06 V
Brady Statistic RA Percent Paced: 1.09 %
Brady Statistic RV Percent Paced: 41.26 %
Date Time Interrogation Session: 20230502060523
Implantable Lead Implant Date: 20220131
Implantable Lead Implant Date: 20220131
Implantable Lead Location: 753859
Implantable Lead Location: 753860
Implantable Lead Model: 5076
Implantable Lead Model: 5076
Implantable Pulse Generator Implant Date: 20220131
Lead Channel Impedance Value: 285 Ohm
Lead Channel Impedance Value: 285 Ohm
Lead Channel Impedance Value: 342 Ohm
Lead Channel Impedance Value: 475 Ohm
Lead Channel Pacing Threshold Amplitude: 0.625 V
Lead Channel Pacing Threshold Amplitude: 0.875 V
Lead Channel Pacing Threshold Pulse Width: 0.4 ms
Lead Channel Pacing Threshold Pulse Width: 0.4 ms
Lead Channel Sensing Intrinsic Amplitude: 0.375 mV
Lead Channel Sensing Intrinsic Amplitude: 0.375 mV
Lead Channel Sensing Intrinsic Amplitude: 3.875 mV
Lead Channel Sensing Intrinsic Amplitude: 3.875 mV
Lead Channel Setting Pacing Amplitude: 1.75 V
Lead Channel Setting Pacing Amplitude: 2 V
Lead Channel Setting Pacing Pulse Width: 0.4 ms
Lead Channel Setting Sensing Sensitivity: 0.9 mV

## 2022-03-05 NOTE — Progress Notes (Signed)
Remote pacemaker transmission.   

## 2022-04-01 ENCOUNTER — Ambulatory Visit (INDEPENDENT_AMBULATORY_CARE_PROVIDER_SITE_OTHER): Payer: Medicare Other | Admitting: Internal Medicine

## 2022-04-01 ENCOUNTER — Encounter: Payer: Self-pay | Admitting: Internal Medicine

## 2022-04-01 VITALS — BP 148/88 | HR 69 | Ht 65.0 in | Wt 167.2 lb

## 2022-04-01 DIAGNOSIS — I442 Atrioventricular block, complete: Secondary | ICD-10-CM | POA: Diagnosis not present

## 2022-04-01 MED ORDER — CARVEDILOL 12.5 MG PO TABS: 12.5000 mg | ORAL_TABLET | Freq: Two times a day (BID) | ORAL | 3 refills | Status: DC

## 2022-04-01 NOTE — Patient Instructions (Signed)
Medication Instructions:  Your physician has recommended you make the following change in your medication:   Stop Taking Cardizem  Increase Coreg to 12.5 mg Two Times Daily   *If you need a refill on your cardiac medications before your next appointment, please call your pharmacy*   Lab Work: NONE   If you have labs (blood work) drawn today and your tests are completely normal, you will receive your results only by: [place] (if you have MyChart) OR A paper copy in the mail If you have any lab test that is abnormal or we need to change your treatment, we will call you to review the results.   Testing/Procedures: NONE    Follow-Up: At [facility], you and your health needs are our priority.  As part of our continuing mission to provide you with exceptional heart care, we have created designated Provider Care Teams.  These Care Teams include your primary Cardiologist (physician) and Advanced Practice Providers (APPs -  Physician Assistants and Nurse Practitioners) who all work together to provide you with the care you need, when you need it.  We recommend signing up for the patient portal called "MyChart".  Sign up information is provided on this After Visit Summary.  MyChart is used to connect with patients for Virtual Visits (Telemedicine).  Patients are able to view lab/test results, encounter notes, upcoming appointments, etc.  Non-urgent messages can be sent to your provider as well.   To learn more about what you can do with MyChart, go to [email or web address].    Your next appointment:   1 year(s)  The format for your next appointment:   In Person  Provider:   Cristopher Peru, MD    Other Instructions Thank you for choosing [facility]!    Important Information About Sugar

## 2022-04-01 NOTE — Progress Notes (Signed)
HPI Kristen Fox returns today for followup. She is a pleasant 88 yo woman with a h/o HTN, sinus node dysfunction, and PAF. She presents today for PM followup. She notes non-exertional chest pressure. No edema. No syncope. She does not have palpitations. Allergies  Allergen Reactions   Metformin Other (See Comments)    Caused stomach upset (tolerates timed release)     Current Outpatient Medications  Medication Sig Dispense Refill   apixaban (ELIQUIS) 2.5 MG TABS tablet Take 1 tablet (2.5 mg total) by mouth 2 (two) times daily. 60 tablet 0   brimonidine (ALPHAGAN) 0.2 % ophthalmic solution Place 1 drop into both eyes 3 (three) times daily.     carvedilol (COREG) 6.25 MG tablet Take 1 tablet (6.25 mg total) by mouth 2 (two) times daily. 180 tablet 0   dapagliflozin propanediol (FARXIGA) 10 MG TABS tablet Take by mouth.     denosumab (PROLIA) 60 MG/ML SOLN injection Inject 60 mg into the skin every 6 (six) months. Administer in upper arm, thigh, or abdomen     diltiazem (CARDIZEM CD) 240 MG 24 hr capsule Take 1 capsule (240 mg total) by mouth daily. 30 capsule 2   dorzolamide-timolol (COSOPT) 22.3-6.8 MG/ML ophthalmic solution Place 1 drop into both eyes 2 (two) times daily.     esomeprazole (NEXIUM) 20 MG capsule Take 20 mg by mouth daily at 12 noon.     gabapentin (NEURONTIN) 100 MG capsule Take 1 capsule (100 mg total) by mouth 2 (two) times daily. 60 capsule 1   hydrochlorothiazide (HYDRODIURIL) 12.5 MG tablet Take 1 tablet (12.5 mg total) by mouth daily. 30 tablet 1   insulin degludec (TRESIBA FLEXTOUCH) 100 UNIT/ML FlexTouch Pen Inject 10-25 Units into the skin at bedtime. Based on CBG     insulin lispro (HUMALOG) 100 UNIT/ML KwikPen Inject 8-14 Units into the skin daily with lunch. Per sliding scale     latanoprost (XALATAN) 0.005 % ophthalmic solution Place 1 drop into both eyes at bedtime.     lisinopril (ZESTRIL) 40 MG tablet Take 1 tablet (40 mg total) by mouth daily. 30  tablet 2   mirtazapine (REMERON) 7.5 MG tablet Take 7.5 mg by mouth at bedtime.     montelukast (SINGULAIR) 10 MG tablet Take 10 mg by mouth at bedtime.     Multiple Vitamins-Calcium (DAILY VITAMINS FOR WOMEN PO) Take by mouth. Women vitamin w/iron taking daily     oxybutynin (DITROPAN) 5 MG tablet Take 5 mg by mouth 3 (three) times daily.     rosuvastatin (CRESTOR) 40 MG tablet Take 40 mg by mouth at bedtime.     albuterol (VENTOLIN HFA) 108 (90 Base) MCG/ACT inhaler Inhale 2 puffs into the lungs every 6 (six) hours as needed for wheezing or shortness of breath (cough). (Patient not taking: Reported on 09/26/2021) 18 g 1   Calcium Carb-Cholecalciferol (CALCIUM 600-D PO) Take 1 tablet by mouth 2 (two) times daily. (Patient not taking: Reported on 04/01/2022)     No current facility-administered medications for this visit.     Past Medical History:  Diagnosis Date   Asthma due to environmental allergies    Basal cell carcinoma 05/08/2020   bcc ant. mid neck    Breast cancer ([place])    2003   Cataract    Chronic kidney disease    Clotting disorder ([place])    Diabetes mellitus ([place])    type II   Diabetic coma ([place])    GERD (gastroesophageal  reflux disease)    Glaucoma    Hyperlipidemia    Hypertension    Osteoporosis    Pneumonia    Pulmonary embolism (HCC)    SCC (squamous cell carcinoma) 05/08/2021   well diff- left forearm-posterior (CX35FU)   SCC (squamous cell carcinoma) 05/08/2021   in situ- left zygomatic area (CX35FU)   Squamous cell carcinoma of skin 10/18/2014   well diff-right upper arm cx3 79f   Squamous cell carcinoma of skin 06/02/2019   in situ-upper lip (txpbx)   Tubular adenoma of colon 2017    ROS:   All systems reviewed and negative except as noted in the HPI.   Past Surgical History:  Procedure Laterality Date   APPENDECTOMY     CATARACT EXTRACTION, BILATERAL     MASTECTOMY     left w/ymph node removal   PACEMAKER IMPLANT N/A 11/19/2020   Procedure:  PACEMAKER IMPLANT;  Surgeon: KDeboraha Sprang MD;  Location[facility];  Service: Cardiovascular;  Laterality: N/A;   SKIN CANCER EXCISION     TONSILLECTOMY     VAGINAL HYSTERECTOMY  1981     Family History  Problem Relation Age of Onset   Diabetes Mother    CVA Mother    Leukemia Father    Colon cancer Neg Hx    Stomach cancer Neg Hx    Pancreatic cancer Neg Hx    Esophageal cancer Neg Hx      Social History   Socioeconomic History   Marital status: Single    Spouse name: Not on file   Number of children: Not on file   Years of education: Not on file   Highest education level: Not on file  Occupational History   Not on file  Tobacco Use   Smoking status: Never   Smokeless tobacco: Never  Vaping Use   Vaping Use: Never used  Substance and Sexual Activity   Alcohol use: No   Drug use: No   Sexual activity: Not on file  Other Topics Concern   Not on file  Social History Narrative   Lives alone   Social Determinants of Health   Financial Resource Strain: Not on file  Food Insecurity: Not on file  Transportation Needs: Not on file  Physical Activity: Not on file  Stress: Not on file  Social Connections: Not on file  Intimate Partner Violence: Not on file     BP (!) 148/88   Pulse 69   Ht '5\' 5"'$  (1.651 m)   Wt 167 lb 3.2 oz (75.8 kg)   SpO2 96%   BMI 27.82 kg/m   Physical Exam:  Well appearing NAD HEENT: Unremarkable Neck:  No JVD, no thyromegally Lymphatics:  No adenopathy Back:  No CVA tenderness Lungs:  Clear HEART:  Regular rate rhythm, no murmurs, no rubs, no clicks Abd:  soft, positive bowel sounds, no organomegally, no rebound, no guarding Ext:  2 plus pulses, no edema, no cyanosis, no clubbing Skin:  No rashes no nodules Neuro:  CN II through XII intact, motor grossly intact  EKG  DEVICE  Normal device function.  See PaceArt for details.   Assess/Plan:  1. PAF - she is now persistent but does not feel any different.  2.  HTN -her bp is not well controlled. I asked her to stop the cardizem and increase coreg to 12.5 bid. 3. PPM - her [employer] device is working normally. We will recheck in several months. 4. Sinus node dysfunction -  she is asymptomatic after PPM insertion. 5. Peripheral edema - I asked her to stop cardizem and increase the coreg and keep her legs elevated.   Carleene Overlie Gurnie Duris,MD

## 2022-04-17 ENCOUNTER — Telehealth: Payer: Self-pay | Admitting: Internal Medicine

## 2022-04-17 NOTE — Telephone Encounter (Signed)
Pt notified and voiced understanding 

## 2022-04-17 NOTE — Telephone Encounter (Signed)
Pt c/o medication issue:  1. Name of Medication: carvedilol (COREG) 12.5 MG tablet  2. How are you currently taking this medication (dosage and times per day)? Take 1 tablet (12.5 mg total) by mouth 2 (two) times daily  3. Are you having a reaction (difficulty breathing--STAT)?   4. What is your medication issue? Patient states she has been feeling dizzy since the dosage of the medication has been changed.  Her BP was 76/53 and 89/58

## 2022-04-17 NOTE — Telephone Encounter (Signed)
Spoke with pt who states that after starting coreg 12.5 mg she has started having dizzy spells. She states that she has been taking Coreg 6.125 mg and Coreg 12.5 mg tablets two times daily. Pt informed that she is to only take Coreg 12.5 mg two times daily. Current BP is 104/63 Hr 91. Please advise.

## 2022-04-18 ENCOUNTER — Telehealth: Payer: Self-pay | Admitting: Internal Medicine

## 2022-04-18 MED ORDER — CARVEDILOL 6.25 MG PO TABS: 9.7500 mg | ORAL_TABLET | Freq: Two times a day (BID) | ORAL | 11 refills | Status: DC

## 2022-04-18 NOTE — Telephone Encounter (Signed)
Patient's daughter calling to verify the patient's medications.

## 2022-04-18 NOTE — Telephone Encounter (Signed)
Pt c/o BP issue: STAT if pt c/o blurred vision, one-sided weakness or slurred speech  1. What are your last 5 BP readings?  92/62 -   This morning 86/56  81/55 -  Last night  2. Are you having any other symptoms (ex. Dizziness, headache, blurred vision, passed out)? No energy. She states she is sluggish.   3. What is your BP issue? Pt states that she believes that the medicine carvedilol 12.5 MG may be causing this and is requesting a call back.

## 2022-04-18 NOTE — Telephone Encounter (Signed)
Pt notified to decrease coreg to 9.75 mg two Times daily.

## 2022-04-18 NOTE — Telephone Encounter (Signed)
Pt's daughter and grandson notified of medication changes.

## 2022-04-18 NOTE — Telephone Encounter (Signed)
Called pt in regards to low BP.  Pt reports todays BP 92/62-81.   Took carvedilol 12.5 mg as ordered last night.  BP 81/55-66 prior to taking medication.  Pt reports SOB and dizziness.  Advised pt not to take AM dose of carvedilol, eat a salty snack (pt has chips) and drink extra fluids.  Again advised pt not to take carvedilol and to call 911 if symptoms worsen.  Will route to MD nurse to f/u up.

## 2022-04-18 NOTE — Telephone Encounter (Signed)
Per Dr. Lovena Le-  MAKE SURE Pt has discontinued her cardizem.  If she has, then reduce carvedilol to 9.75 mg PO BID.  Forwarding to [facility] triage.

## 2022-04-20 ENCOUNTER — Telehealth: Payer: Self-pay | Admitting: Cardiology

## 2022-04-20 NOTE — Telephone Encounter (Signed)
Patient's daughter called in requesting advice regarding medications.  States she was recently seen in the office and had been instructed to stop her diltiazem and her Coreg was increased to 12.5 mg twice daily.  Patient was confused and actually taking multiple doses of Coreg which was causing her blood pressure to drop.  Called into the office and was instructed to reduce the carvedilol to 9.75 mg twice daily.  Daughter reports her blood pressure has continued to fluctuate over the weekend, even down in the 93X systolic at times.  I advised it could take several days for the higher doses of blood pressure medication to wash out of her system.  Instructed to reduce her dose back to 6.25 mg twice daily for now as she has a history of persistent atrial fibrillation, in order to maintain rate control. If her systolic pressure is less than 100 to hold the medication at that time.  Informed I suspect her blood pressures will regulate over the next 2 to 3 days, but to attempt to keep Coreg on board.  Daughter was agreeable to this plan and thanked me for call back.

## 2022-04-23 ENCOUNTER — Telehealth: Payer: Self-pay | Admitting: Internal Medicine

## 2022-04-23 NOTE — Telephone Encounter (Signed)
Spoke to pt's daughter who stated that bp has not gotten better since the weekend. Pt is still struggling to keep systolic pressure above 025- today was 103.  Per daughter, pt had taken coreg wrong in the beginning, but has now gotten it straightened out. Pt's daughter is questioning if pt needs to be on Coreg with pressures this low. Pt's daughter did not have specific bp/hr readings on hand. Advised to take bp/hr and log it. Read back to pt's daughter instructions from Reino Bellis, NP from 04/20/2022 and pt's daughter verbalized that they were following these instructions, but bp is still too low. Please advise.

## 2022-04-23 NOTE — Telephone Encounter (Signed)
Pt c/o BP issue: STAT if pt c/o blurred vision, one-sided weakness or slurred speech  1. What are your last 5 BP readings? BP ranging in low 100's  2. Are you having any other symptoms (ex. Dizziness, headache, blurred vision, passed out)? "seems really washed out"  3. What is your BP issue? Hypotension. Wanting to know if there is something they can do to raise BP.

## 2022-04-27 NOTE — Telephone Encounter (Signed)
If BP is symptomatically low on Coreg, then ask the patient to stop the coreg. Thanks. GT

## 2022-04-28 ENCOUNTER — Encounter: Payer: Self-pay | Admitting: Internal Medicine

## 2022-04-28 NOTE — Telephone Encounter (Signed)
Patient states she has only taken coreg 3.125 mg bid twice in the last week and feels so fatigued.   She asked what else can she do.   She is taking Lisinopril 40 mg qd

## 2022-04-28 NOTE — Telephone Encounter (Signed)
error 

## 2022-04-28 NOTE — Telephone Encounter (Signed)
   Pt is calling back to f/u. She said her BP today is 95/72. She would like to see Dr. Lovena Le but can only go to [place] office

## 2022-04-29 NOTE — Telephone Encounter (Signed)
Pt notified to stop Coreg and monitor BP.

## 2022-05-20 ENCOUNTER — Ambulatory Visit (INDEPENDENT_AMBULATORY_CARE_PROVIDER_SITE_OTHER): Payer: Medicare Other

## 2022-05-20 DIAGNOSIS — I495 Sick sinus syndrome: Secondary | ICD-10-CM

## 2022-05-20 LAB — CUP PACEART REMOTE DEVICE CHECK
Battery Remaining Longevity: 151 mo
Battery Voltage: 3.04 V
Brady Statistic AP VP Percent: 0.04 %
Brady Statistic AP VS Percent: 20.7 %
Brady Statistic AS VP Percent: 0.07 %
Brady Statistic AS VS Percent: 79.19 %
Brady Statistic RA Percent Paced: 1.06 %
Brady Statistic RV Percent Paced: 30.5 %
Date Time Interrogation Session: 20230731225414
Implantable Lead Implant Date: 20220131
Implantable Lead Implant Date: 20220131
Implantable Lead Location: 753859
Implantable Lead Location: 753860
Implantable Lead Model: 5076
Implantable Lead Model: 5076
Implantable Pulse Generator Implant Date: 20220131
Lead Channel Impedance Value: 304 Ohm
Lead Channel Impedance Value: 304 Ohm
Lead Channel Impedance Value: 361 Ohm
Lead Channel Impedance Value: 475 Ohm
Lead Channel Pacing Threshold Amplitude: 0.625 V
Lead Channel Pacing Threshold Amplitude: 0.875 V
Lead Channel Pacing Threshold Pulse Width: 0.4 ms
Lead Channel Pacing Threshold Pulse Width: 0.4 ms
Lead Channel Sensing Intrinsic Amplitude: 1.25 mV
Lead Channel Sensing Intrinsic Amplitude: 1.25 mV
Lead Channel Sensing Intrinsic Amplitude: 3 mV
Lead Channel Sensing Intrinsic Amplitude: 3 mV
Lead Channel Setting Pacing Amplitude: 1.75 V
Lead Channel Setting Pacing Amplitude: 2 V
Lead Channel Setting Pacing Pulse Width: 0.4 ms
Lead Channel Setting Sensing Sensitivity: 0.9 mV

## 2022-05-20 IMAGING — CT CT HEAD W/O CM
3 series · 16 of 47 positions shown, 19 images · non-contrast
Comparison: None.

CLINICAL DATA: Head trauma

EXAM:
CT HEAD WITHOUT CONTRAST
TECHNIQUE: Contiguous axial images were obtained from the base of the skull
through the vertex without intravenous contrast.

[Series 2: head w o · axial · 0.42mm/px · z∈[+26,+161]mm · 10 of 33 slices shown, 13 images]
[im 3/33  brain]
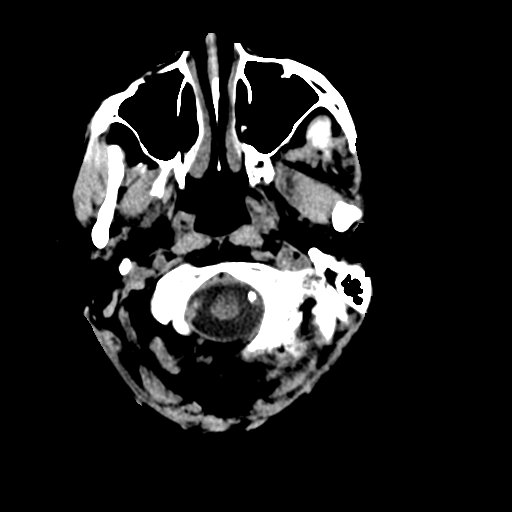
[im 3/33  bone]
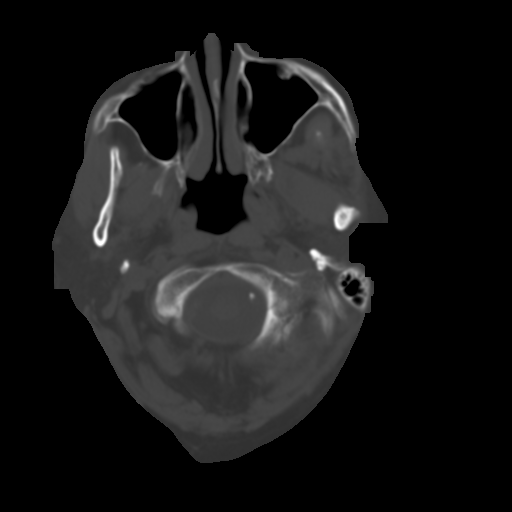
[im 6/33  brain]
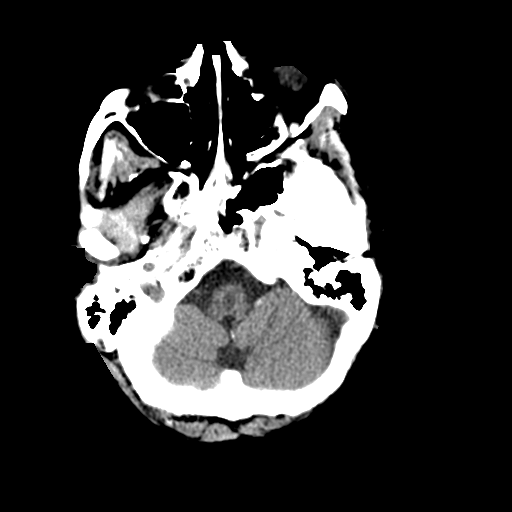
[im 9/33  brain]
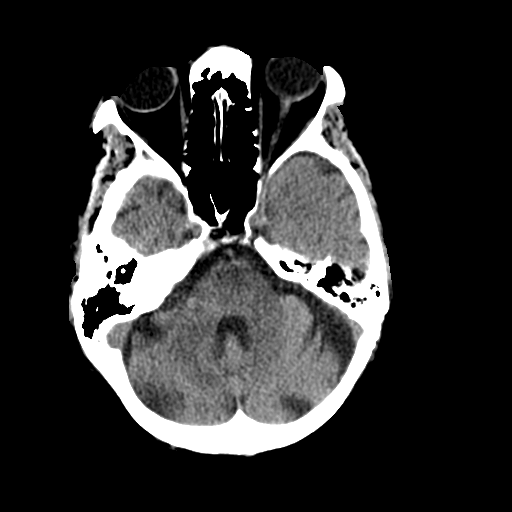
[im 12/33  brain]
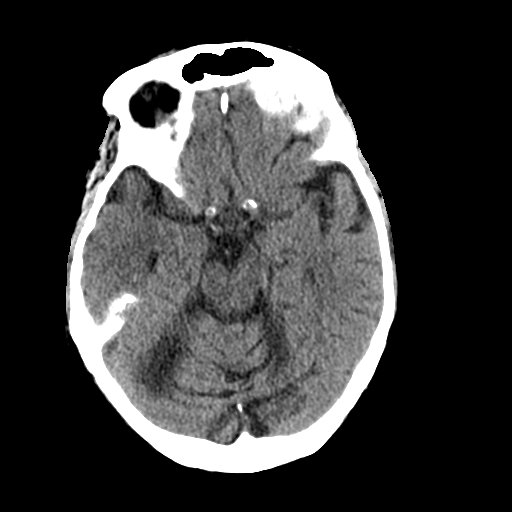
[im 15/33  brain]
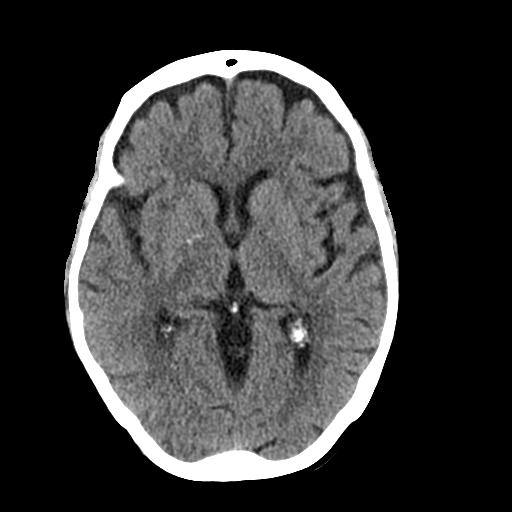
[im 15/33  bone]
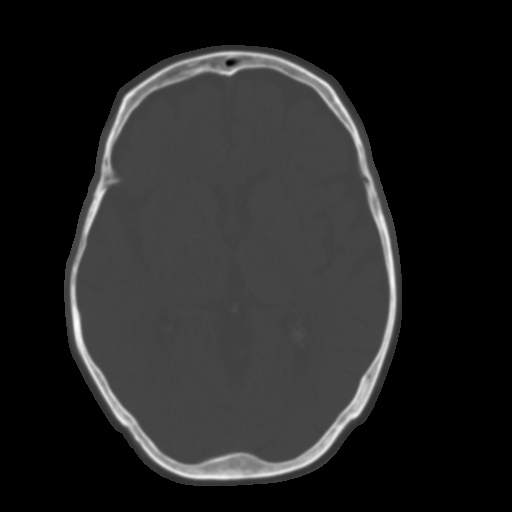
[im 18/33  brain]
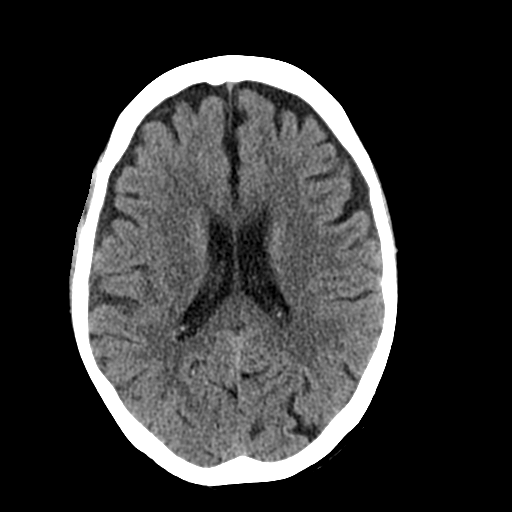
[im 21/33  brain]
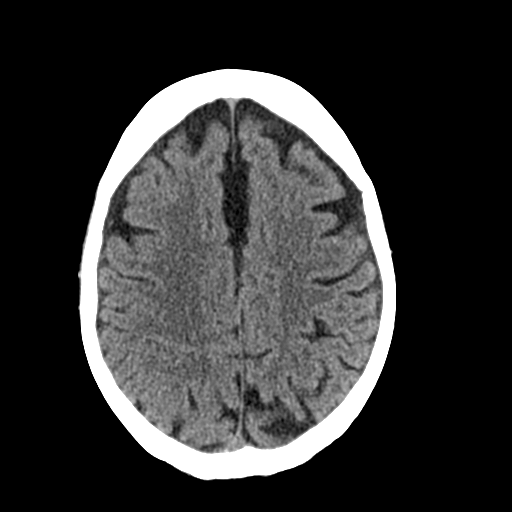
[im 25/33  brain]
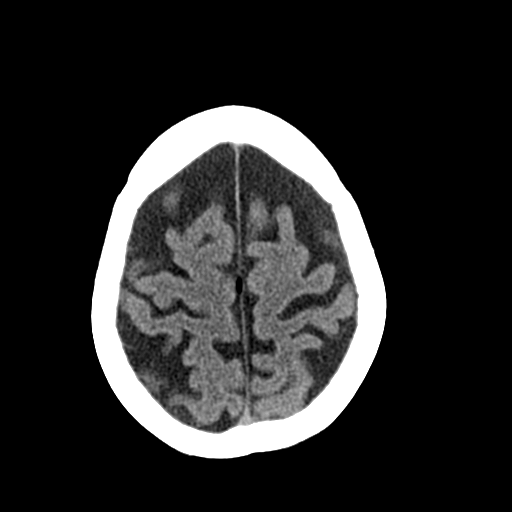
[im 27/33  brain]
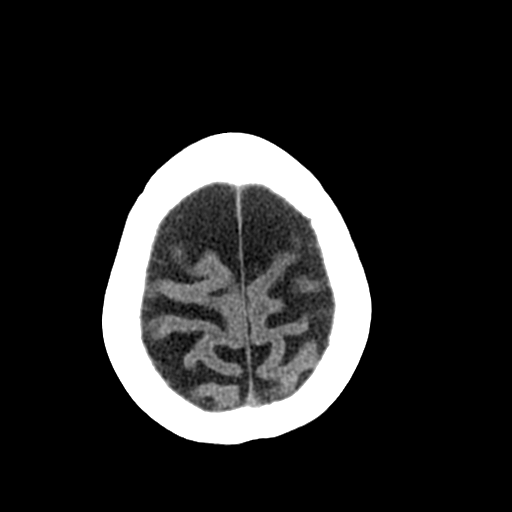
[im 27/33  bone]
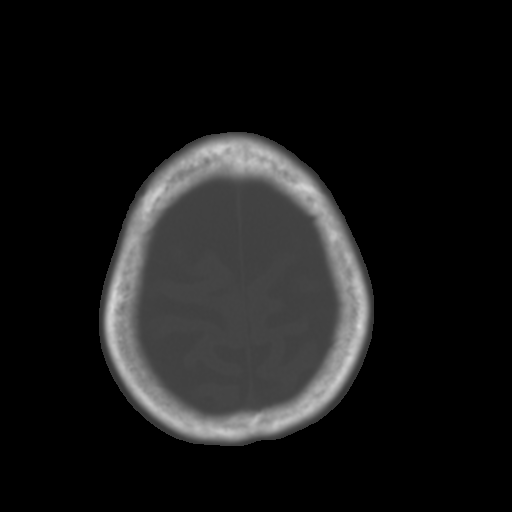
[im 30/33  brain]
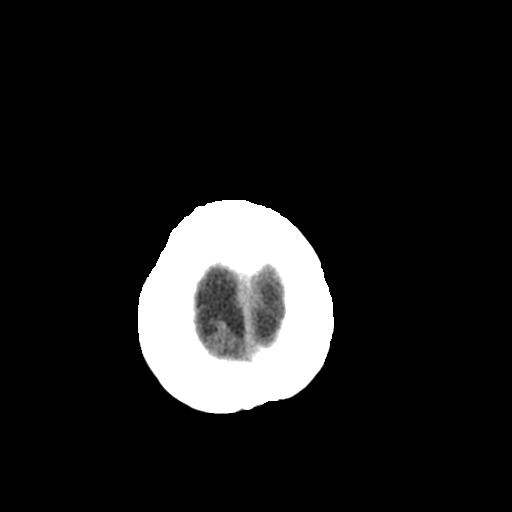

[Series 4: coronal soft · coronal · 0.32mm/px · 3 of 67 slices shown]
[im 23/67  brain]
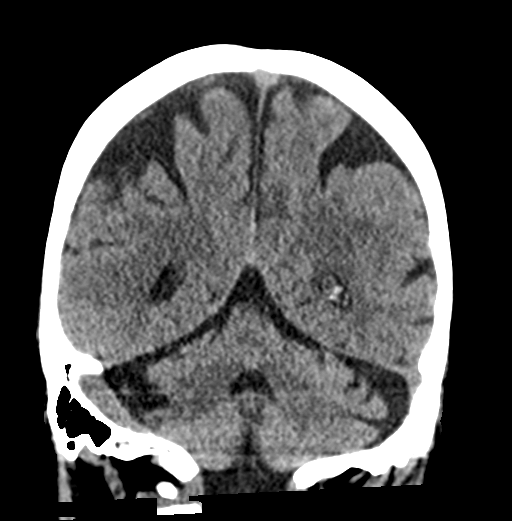
[im 30/67  brain]
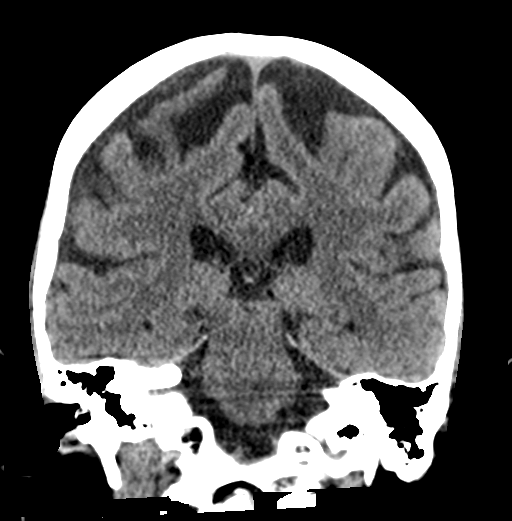
[im 37/67  brain]
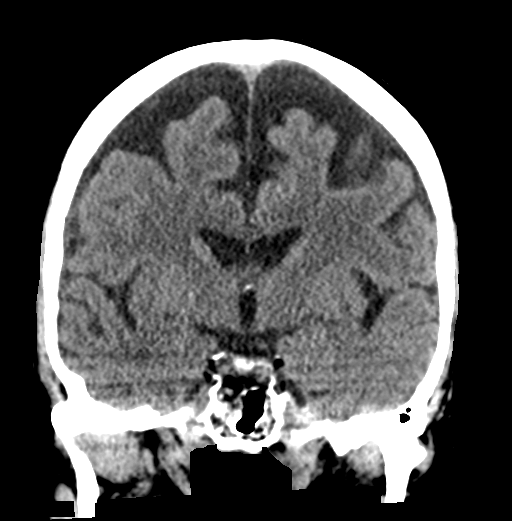

[Series 5: sagittal soft · sagittal · 0.34mm/px · 3 of 53 slices shown]
[im 18/53  brain]
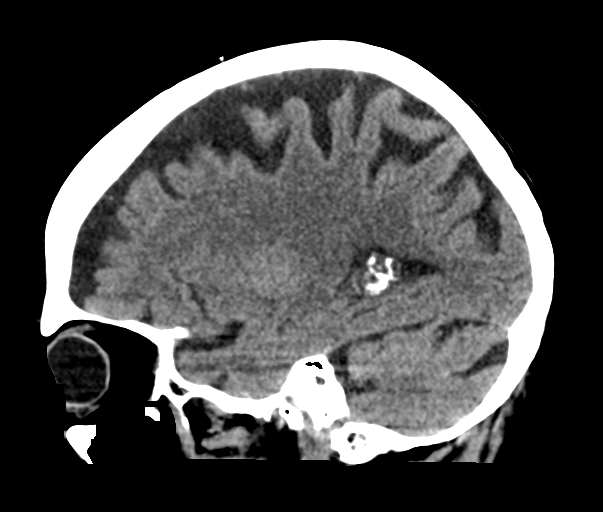
[im 27/53  brain]
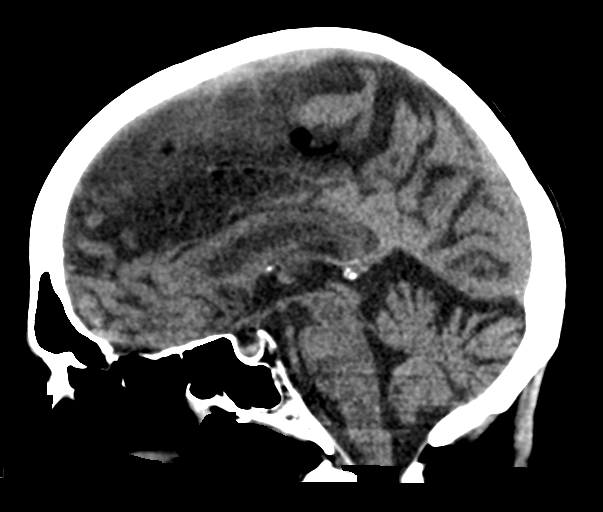
[im 35/53  brain]
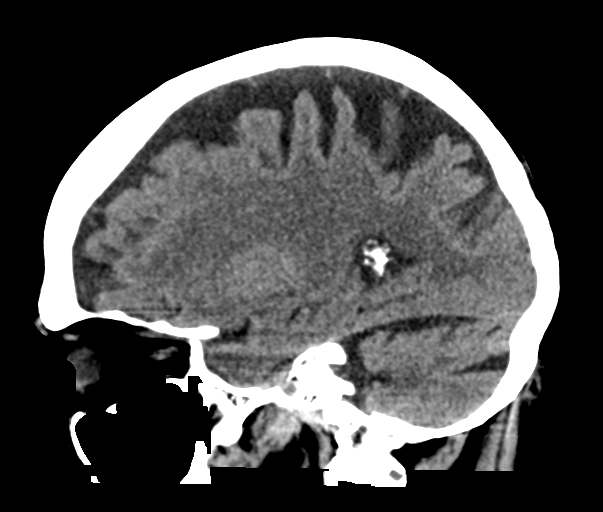

[16 of 47 positions shown; findings below may reference images not displayed]

FINDINGS: Brain: No acute territorial infarction, hemorrhage, or intracranial
mass. Moderate atrophy. Nonenlarged ventricles

Vascular: No hyperdense vessels. Scattered carotid vascular
calcification

Skull: Normal. Negative for fracture or focal lesion.

Sinuses/Orbits: No acute finding.

Other: None
IMPRESSION: 1. No CT evidence for acute intracranial abnormality.
2. Atrophy

## 2022-05-20 IMAGING — DX DG CHEST 1V PORT
1 series · 1 of 1 positions shown · non-contrast
Comparison: 11/20/2018

CLINICAL DATA: Positive for COVID and strep throat, shortness of
breath

EXAM:
PORTABLE CHEST 1 VIEW

[chest ap]
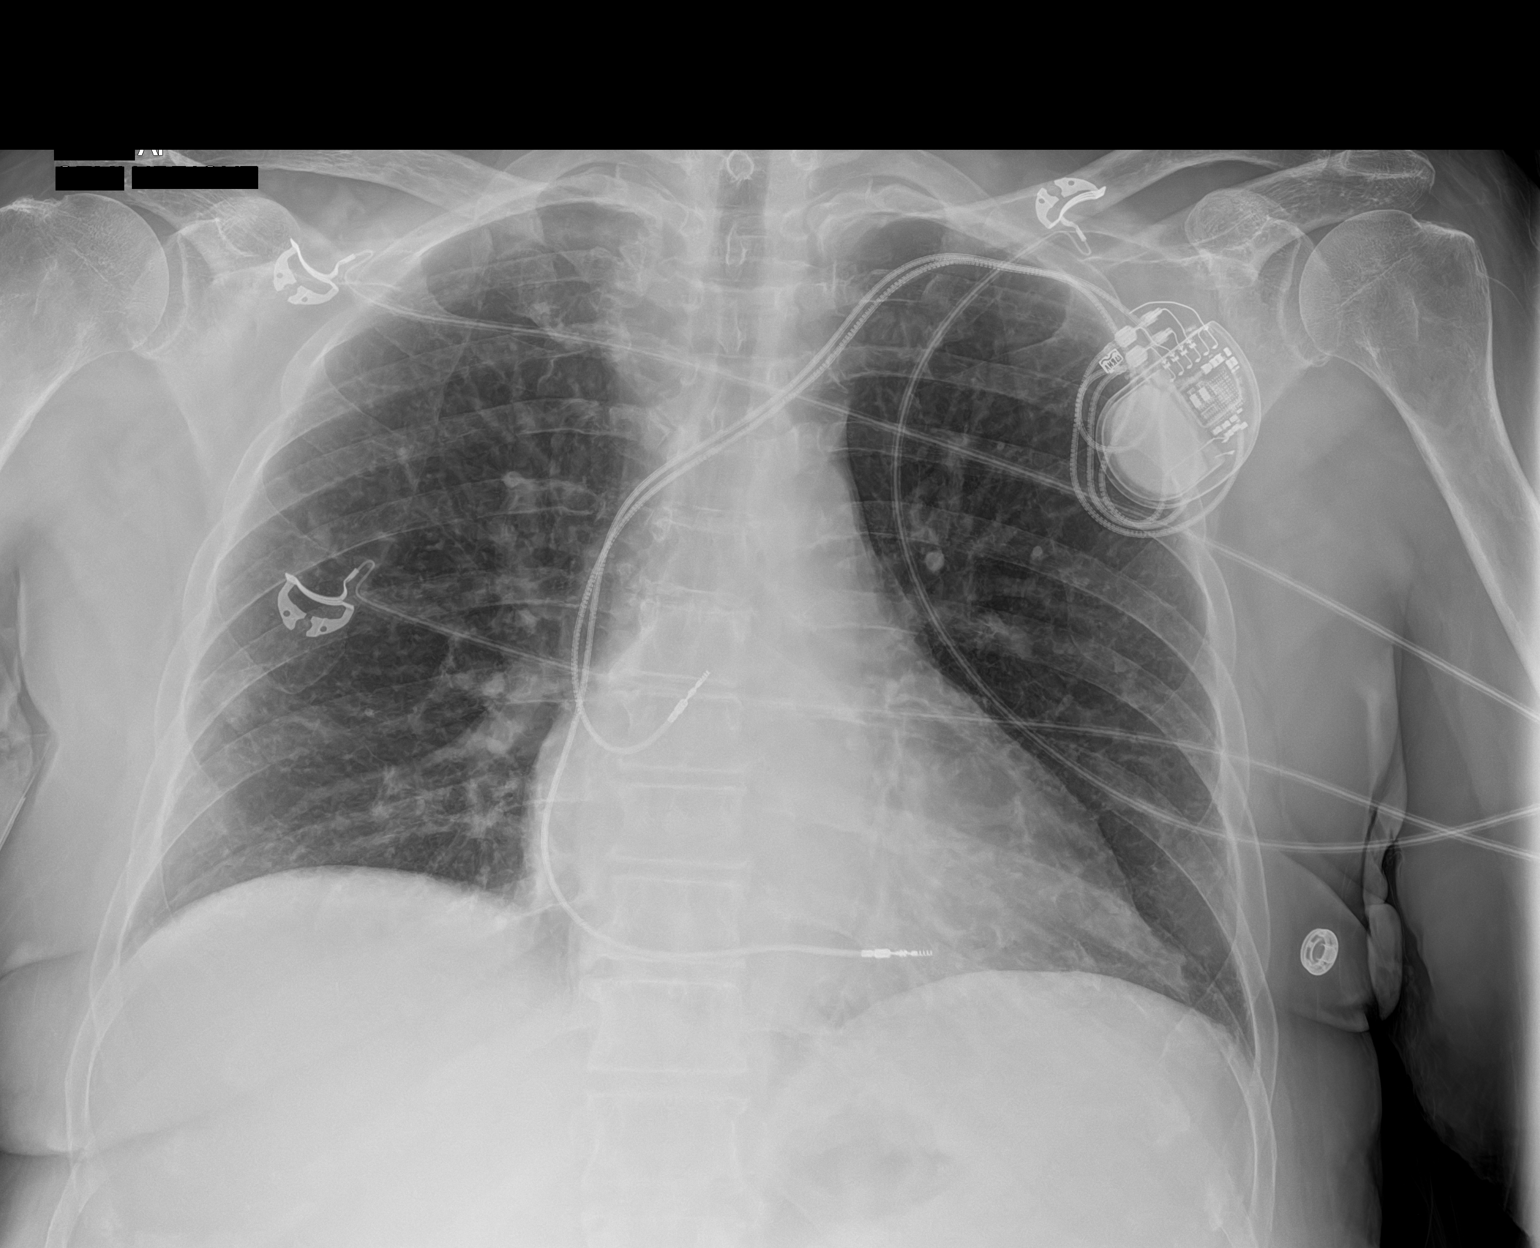

[1 of 1 positions shown; findings below may reference images not displayed]

FINDINGS: Left chest pacemaker with leads in the right atrium and right
ventricle. Unchanged cardiac and mediastinal silhouettes. No focal
consolidative opacity. No pleural effusion. No acute osseous
abnormality.
IMPRESSION: No acute cardiopulmonary process.

## 2022-06-17 NOTE — Progress Notes (Signed)
Remote pacemaker transmission.   

## 2022-08-19 ENCOUNTER — Ambulatory Visit: Payer: Medicare Other | Attending: Internal Medicine

## 2022-08-19 DIAGNOSIS — I495 Sick sinus syndrome: Secondary | ICD-10-CM | POA: Diagnosis not present

## 2022-08-19 DIAGNOSIS — Z950 Presence of cardiac pacemaker: Secondary | ICD-10-CM | POA: Diagnosis not present

## 2022-08-20 LAB — CUP PACEART REMOTE DEVICE CHECK
Battery Remaining Longevity: 149 mo
Battery Voltage: 3.04 V
Brady Statistic AP VP Percent: 0.04 %
Brady Statistic AP VS Percent: 40.34 %
Brady Statistic AS VP Percent: 0.08 %
Brady Statistic AS VS Percent: 59.55 %
Brady Statistic RA Percent Paced: 38.87 %
Brady Statistic RV Percent Paced: 0.27 %
Date Time Interrogation Session: 20231030215233
Implantable Lead Connection Status: 753985
Implantable Lead Connection Status: 753985
Implantable Lead Implant Date: 20220131
Implantable Lead Implant Date: 20220131
Implantable Lead Location: 753859
Implantable Lead Location: 753860
Implantable Lead Model: 5076
Implantable Lead Model: 5076
Implantable Pulse Generator Implant Date: 20220131
Lead Channel Impedance Value: 323 Ohm
Lead Channel Impedance Value: 342 Ohm
Lead Channel Impedance Value: 361 Ohm
Lead Channel Impedance Value: 380 Ohm
Lead Channel Pacing Threshold Amplitude: 0.5 V
Lead Channel Pacing Threshold Amplitude: 0.75 V
Lead Channel Pacing Threshold Pulse Width: 0.4 ms
Lead Channel Pacing Threshold Pulse Width: 0.4 ms
Lead Channel Sensing Intrinsic Amplitude: 2.25 mV
Lead Channel Sensing Intrinsic Amplitude: 2.25 mV
Lead Channel Sensing Intrinsic Amplitude: 3.875 mV
Lead Channel Sensing Intrinsic Amplitude: 3.875 mV
Lead Channel Setting Pacing Amplitude: 1.5 V
Lead Channel Setting Pacing Amplitude: 2 V
Lead Channel Setting Pacing Pulse Width: 0.4 ms
Lead Channel Setting Sensing Sensitivity: 0.9 mV
Zone Setting Status: 755011
Zone Setting Status: 755011

## 2022-09-03 NOTE — Progress Notes (Signed)
Remote pacemaker transmission.   

## 2022-11-18 ENCOUNTER — Ambulatory Visit: Payer: Medicare Other

## 2022-11-18 DIAGNOSIS — I495 Sick sinus syndrome: Secondary | ICD-10-CM

## 2022-11-18 LAB — CUP PACEART REMOTE DEVICE CHECK
Battery Remaining Longevity: 143 mo
Battery Voltage: 3.03 V
Brady Statistic AP VP Percent: 0.05 %
Brady Statistic AP VS Percent: 79.46 %
Brady Statistic AS VP Percent: 0.07 %
Brady Statistic AS VS Percent: 20.42 %
Brady Statistic RA Percent Paced: 79.56 %
Brady Statistic RV Percent Paced: 0.12 %
Date Time Interrogation Session: 20240129212554
Implantable Lead Connection Status: 753985
Implantable Lead Connection Status: 753985
Implantable Lead Implant Date: 20220131
Implantable Lead Implant Date: 20220131
Implantable Lead Location: 753859
Implantable Lead Location: 753860
Implantable Lead Model: 5076
Implantable Lead Model: 5076
Implantable Pulse Generator Implant Date: 20220131
Lead Channel Impedance Value: 304 Ohm
Lead Channel Impedance Value: 304 Ohm
Lead Channel Impedance Value: 342 Ohm
Lead Channel Impedance Value: 342 Ohm
Lead Channel Pacing Threshold Amplitude: 0.5 V
Lead Channel Pacing Threshold Amplitude: 0.75 V
Lead Channel Pacing Threshold Pulse Width: 0.4 ms
Lead Channel Pacing Threshold Pulse Width: 0.4 ms
Lead Channel Sensing Intrinsic Amplitude: 1.875 mV
Lead Channel Sensing Intrinsic Amplitude: 1.875 mV
Lead Channel Sensing Intrinsic Amplitude: 4.375 mV
Lead Channel Sensing Intrinsic Amplitude: 4.375 mV
Lead Channel Setting Pacing Amplitude: 1.75 V
Lead Channel Setting Pacing Amplitude: 2 V
Lead Channel Setting Pacing Pulse Width: 0.4 ms
Lead Channel Setting Sensing Sensitivity: 0.9 mV
Zone Setting Status: 755011
Zone Setting Status: 755011

## 2022-12-12 NOTE — Progress Notes (Signed)
Remote pacemaker transmission.   

## 2023-02-17 ENCOUNTER — Ambulatory Visit (INDEPENDENT_AMBULATORY_CARE_PROVIDER_SITE_OTHER): Payer: Medicare Other

## 2023-02-17 DIAGNOSIS — I495 Sick sinus syndrome: Secondary | ICD-10-CM | POA: Diagnosis not present

## 2023-02-18 LAB — CUP PACEART REMOTE DEVICE CHECK
Battery Remaining Longevity: 140 mo
Battery Voltage: 3.03 V
Brady Statistic AP VP Percent: 0.04 %
Brady Statistic AP VS Percent: 70.91 %
Brady Statistic AS VP Percent: 0.05 %
Brady Statistic AS VS Percent: 29 %
Brady Statistic RA Percent Paced: 71.02 %
Brady Statistic RV Percent Paced: 0.09 %
Date Time Interrogation Session: 20240429231729
Implantable Lead Connection Status: 753985
Implantable Lead Connection Status: 753985
Implantable Lead Implant Date: 20220131
Implantable Lead Implant Date: 20220131
Implantable Lead Location: 753859
Implantable Lead Location: 753860
Implantable Lead Model: 5076
Implantable Lead Model: 5076
Implantable Pulse Generator Implant Date: 20220131
Lead Channel Impedance Value: 304 Ohm
Lead Channel Impedance Value: 304 Ohm
Lead Channel Impedance Value: 342 Ohm
Lead Channel Impedance Value: 342 Ohm
Lead Channel Pacing Threshold Amplitude: 0.625 V
Lead Channel Pacing Threshold Amplitude: 0.875 V
Lead Channel Pacing Threshold Pulse Width: 0.4 ms
Lead Channel Pacing Threshold Pulse Width: 0.4 ms
Lead Channel Sensing Intrinsic Amplitude: 1.5 mV
Lead Channel Sensing Intrinsic Amplitude: 1.5 mV
Lead Channel Sensing Intrinsic Amplitude: 3.75 mV
Lead Channel Sensing Intrinsic Amplitude: 3.75 mV
Lead Channel Setting Pacing Amplitude: 1.75 V
Lead Channel Setting Pacing Amplitude: 2 V
Lead Channel Setting Pacing Pulse Width: 0.4 ms
Lead Channel Setting Sensing Sensitivity: 0.9 mV
Zone Setting Status: 755011
Zone Setting Status: 755011

## 2023-03-11 ENCOUNTER — Telehealth: Payer: Self-pay | Admitting: Internal Medicine

## 2023-03-11 NOTE — Telephone Encounter (Signed)
Patient's daughter, Misty Stanley called in with concern of patients increased lightheadedness and feeling weak. She experience a fall a few weeks ago and was evaluated in [place] and the CT of her head was negative.  Patient cannot say if she is having palpitations, she has a history of Afib and is on Eliquis. She denies chest pain and N/V.   Scheduled with APP on 03/20/23. Provided education on when to seek emergency care or call 911.  Misty Stanley verbalized understanding and had no questions.

## 2023-03-11 NOTE — Progress Notes (Signed)
Remote pacemaker transmission.   

## 2023-03-11 NOTE — Telephone Encounter (Signed)
Patient c/o Palpitations:  High priority if patient c/o lightheadedness, shortness of breath, or chest pain  How long have you had palpitations/irregular HR/ Afib? Are you having the symptoms now? 2 weeks; think so but not sure  Are you currently experiencing lightheadedness, SOB or CP? lightheadedness  Do you have a history of afib (atrial fibrillation) or irregular heart rhythm? Yes   Have you checked your BP or HR? (document readings if available): no    Are you experiencing any other symptoms? No

## 2023-03-20 ENCOUNTER — Ambulatory Visit: Payer: Medicare Other | Admitting: Nurse Practitioner

## 2023-04-14 ENCOUNTER — Ambulatory Visit: Payer: Medicare Other | Attending: Internal Medicine | Admitting: Internal Medicine

## 2023-04-14 ENCOUNTER — Encounter: Payer: Self-pay | Admitting: Internal Medicine

## 2023-04-14 VITALS — BP 128/60 | HR 66 | Ht 65.0 in | Wt 163.0 lb

## 2023-04-14 DIAGNOSIS — I442 Atrioventricular block, complete: Secondary | ICD-10-CM | POA: Diagnosis present

## 2023-04-14 LAB — CUP PACEART INCLINIC DEVICE CHECK
Battery Remaining Longevity: 141 mo
Battery Voltage: 3.03 V
Brady Statistic AP VP Percent: 0.06 %
Brady Statistic AP VS Percent: 61.18 %
Brady Statistic AS VP Percent: 0.07 %
Brady Statistic AS VS Percent: 38.7 %
Brady Statistic RA Percent Paced: 61.32 %
Brady Statistic RV Percent Paced: 0.13 %
Date Time Interrogation Session: 20240625172912
Implantable Lead Connection Status: 753985
Implantable Lead Connection Status: 753985
Implantable Lead Implant Date: 20220131
Implantable Lead Implant Date: 20220131
Implantable Lead Location: 753859
Implantable Lead Location: 753860
Implantable Lead Model: 5076
Implantable Lead Model: 5076
Implantable Pulse Generator Implant Date: 20220131
Lead Channel Impedance Value: 342 Ohm
Lead Channel Impedance Value: 380 Ohm
Lead Channel Impedance Value: 399 Ohm
Lead Channel Impedance Value: 399 Ohm
Lead Channel Pacing Threshold Amplitude: 0.75 V
Lead Channel Pacing Threshold Amplitude: 0.75 V
Lead Channel Pacing Threshold Pulse Width: 0.4 ms
Lead Channel Pacing Threshold Pulse Width: 0.4 ms
Lead Channel Sensing Intrinsic Amplitude: 1.875 mV
Lead Channel Sensing Intrinsic Amplitude: 2.375 mV
Lead Channel Sensing Intrinsic Amplitude: 4.25 mV
Lead Channel Sensing Intrinsic Amplitude: 4.625 mV
Lead Channel Setting Pacing Amplitude: 1.5 V
Lead Channel Setting Pacing Amplitude: 2 V
Lead Channel Setting Pacing Pulse Width: 0.4 ms
Lead Channel Setting Sensing Sensitivity: 0.9 mV
Zone Setting Status: 755011
Zone Setting Status: 755011

## 2023-04-14 NOTE — Patient Instructions (Signed)
Medication Instructions:  Your physician recommends that you continue on your current medications as directed. Please refer to the Current Medication list given to you today.   Labwork: None today  Testing/Procedures: None today  Follow-Up: 1 year  Any Other Special Instructions Will Be Listed Below (If Applicable).  If you need a refill on your cardiac medications before your next appointment, please call your pharmacy.  

## 2023-04-14 NOTE — Progress Notes (Signed)
HPI Kristen Fox returns today for followup. She is a pleasant 89 yo woman with a h/o HTN, sinus node dysfunction, and PAF. She presents today for PM followup. She notes non-exertional chest pressure. No edema. No syncope. She does not have palpitations. She c/o getting lightheaded but she is not checking her bp at home. Allergies  Allergen Reactions   Metformin Other (See Comments)    Caused stomach upset (tolerates timed release)     Current Outpatient Medications  Medication Sig Dispense Refill   apixaban (ELIQUIS) 2.5 MG TABS tablet Take 1 tablet (2.5 mg total) by mouth 2 (two) times daily. 60 tablet 0   brimonidine (ALPHAGAN) 0.2 % ophthalmic solution Place 1 drop into both eyes 3 (three) times daily.     Calcium Carb-Cholecalciferol (CALCIUM 600-D PO) Take 1 tablet by mouth 2 (two) times daily.     carvedilol (COREG) 6.25 MG tablet Take 1.5 tablets (9.375 mg total) by mouth 2 (two) times daily with a meal. 90 tablet 11   denosumab (PROLIA) 60 MG/ML SOLN injection Inject 60 mg into the skin every 6 (six) months. Administer in upper arm, thigh, or abdomen     dorzolamide-timolol (COSOPT) 22.3-6.8 MG/ML ophthalmic solution Place 1 drop into both eyes 2 (two) times daily.     esomeprazole (NEXIUM) 20 MG capsule Take 20 mg by mouth daily at 12 noon.     gabapentin (NEURONTIN) 100 MG capsule Take 1 capsule (100 mg total) by mouth 2 (two) times daily. 60 capsule 1   hydrochlorothiazide (HYDRODIURIL) 12.5 MG tablet Take 1 tablet (12.5 mg total) by mouth daily. 30 tablet 1   insulin degludec (TRESIBA FLEXTOUCH) 100 UNIT/ML FlexTouch Pen Inject 10-25 Units into the skin at bedtime. Based on CBG     insulin lispro (HUMALOG) 100 UNIT/ML KwikPen Inject 8-14 Units into the skin daily with lunch. Per sliding scale     JARDIANCE 25 MG TABS tablet Take 25 mg by mouth every morning.     latanoprost (XALATAN) 0.005 % ophthalmic solution Place 1 drop into both eyes at bedtime.     lisinopril  (ZESTRIL) 40 MG tablet Take 1 tablet (40 mg total) by mouth daily. 30 tablet 2   mirtazapine (REMERON) 7.5 MG tablet Take 7.5 mg by mouth at bedtime.     montelukast (SINGULAIR) 10 MG tablet Take 10 mg by mouth at bedtime.     Multiple Vitamins-Calcium (DAILY VITAMINS FOR WOMEN PO) Take by mouth. Women vitamin w/iron taking daily     oxybutynin (DITROPAN) 5 MG tablet Take 5 mg by mouth 3 (three) times daily.     rosuvastatin (CRESTOR) 40 MG tablet Take 40 mg by mouth at bedtime.     albuterol (VENTOLIN HFA) 108 (90 Base) MCG/ACT inhaler Inhale 2 puffs into the lungs every 6 (six) hours as needed for wheezing or shortness of breath (cough). (Patient not taking: Reported on 09/26/2021) 18 g 1   DULoxetine (CYMBALTA) 30 MG capsule Take by mouth. (Patient not taking: Reported on 04/14/2023)     No current facility-administered medications for this visit.     Past Medical History:  Diagnosis Date   Asthma due to environmental allergies    Basal cell carcinoma 05/08/2020   bcc ant. mid neck    Breast cancer (HCC)    2003   Cataract    Chronic kidney disease    Clotting disorder (HCC)    Diabetes mellitus (HCC)    type II  Diabetic coma (HCC)    GERD (gastroesophageal reflux disease)    Glaucoma    Hyperlipidemia    Hypertension    Osteoporosis    Pneumonia    Pulmonary embolism (HCC)    SCC (squamous cell carcinoma) 05/08/2021   well diff- left forearm-posterior (CX35FU)   SCC (squamous cell carcinoma) 05/08/2021   in situ- left zygomatic area (CX35FU)   Squamous cell carcinoma of skin 10/18/2014   well diff-right upper arm cx3 74fu   Squamous cell carcinoma of skin 06/02/2019   in situ-upper lip (txpbx)   Tubular adenoma of colon 2017    ROS:   All systems reviewed and negative except as noted in the HPI.   Past Surgical History:  Procedure Laterality Date   APPENDECTOMY     CATARACT EXTRACTION, BILATERAL     MASTECTOMY     left w/ymph node removal   PACEMAKER IMPLANT  N/A 11/19/2020   Procedure: PACEMAKER IMPLANT;  Surgeon: Duke Salvia, MD;  Location: [facility] INVASIVE CV LAB;  Service: Cardiovascular;  Laterality: N/A;   SKIN CANCER EXCISION     TONSILLECTOMY     VAGINAL HYSTERECTOMY  1981     Family History  Problem Relation Age of Onset   Diabetes Mother    CVA Mother    Leukemia Father    Colon cancer Neg Hx    Stomach cancer Neg Hx    Pancreatic cancer Neg Hx    Esophageal cancer Neg Hx      Social History   Socioeconomic History   Marital status: Single    Spouse name: Not on file   Number of children: Not on file   Years of education: Not on file   Highest education level: Not on file  Occupational History   Not on file  Tobacco Use   Smoking status: Never   Smokeless tobacco: Never  Vaping Use   Vaping Use: Never used  Substance and Sexual Activity   Alcohol use: No   Drug use: No   Sexual activity: Not on file  Other Topics Concern   Not on file  Social History Narrative   Lives alone   Social Determinants of Health   Financial Resource Strain: Not on file  Food Insecurity: Not on file  Transportation Needs: Not on file  Physical Activity: Not on file  Stress: Not on file  Social Connections: Not on file  Intimate Partner Violence: Not on file     Ht 5\' 5"  (1.651 m)   Wt 163 lb (73.9 kg)   BMI 27.12 kg/m   Physical Exam:  Well appearing NAD HEENT: Unremarkable Neck:  No JVD, no thyromegally Lymphatics:  No adenopathy Back:  No CVA tenderness Lungs:  Clear with no wheezes HEART:  Regular rate rhythm, no murmurs, no rubs, no clicks Abd:  soft, positive bowel sounds, no organomegally, no rebound, no guarding Ext:  2 plus pulses, no edema, no cyanosis, no clubbing Skin:  No rashes no nodules Neuro:  CN II through XII intact, motor grossly intact  EKG - nsr with AS MI, age undetermined  DEVICE  Normal device function.  See PaceArt for details.   Assess/Plan:  1. PAF - she is now back in NSR.  Continue current meds. 2. HTN -her bp is better in the office. I have asked her to check at home. 3. PPM - her [employer] device is working normally. We will recheck in several months. Today we turned her rate response on  and increased the rate to 4. Sinus node dysfunction - she is asymptomatic after PPM insertion. 5. Peripheral edema -stable. We will follow.   Sharlot Gowda Arturo Sofranko,MD

## 2023-05-19 ENCOUNTER — Ambulatory Visit: Payer: Medicare Other

## 2023-06-17 ENCOUNTER — Ambulatory Visit (INDEPENDENT_AMBULATORY_CARE_PROVIDER_SITE_OTHER): Payer: Medicare Other

## 2023-06-17 DIAGNOSIS — I495 Sick sinus syndrome: Secondary | ICD-10-CM

## 2023-06-17 LAB — CUP PACEART REMOTE DEVICE CHECK
Battery Remaining Longevity: 135 mo
Battery Voltage: 3.02 V
Brady Statistic AP VP Percent: 0.07 %
Brady Statistic AP VS Percent: 97.56 %
Brady Statistic AS VP Percent: 0.02 %
Brady Statistic AS VS Percent: 2.4 %
Brady Statistic RA Percent Paced: 72.9 %
Brady Statistic RV Percent Paced: 3.43 %
Date Time Interrogation Session: 20240828084846
Implantable Lead Connection Status: 753985
Implantable Lead Connection Status: 753985
Implantable Lead Implant Date: 20220131
Implantable Lead Implant Date: 20220131
Implantable Lead Location: 753859
Implantable Lead Location: 753860
Implantable Lead Model: 5076
Implantable Lead Model: 5076
Implantable Pulse Generator Implant Date: 20220131
Lead Channel Impedance Value: 304 Ohm
Lead Channel Impedance Value: 342 Ohm
Lead Channel Impedance Value: 342 Ohm
Lead Channel Impedance Value: 342 Ohm
Lead Channel Pacing Threshold Amplitude: 0.75 V
Lead Channel Pacing Threshold Amplitude: 0.875 V
Lead Channel Pacing Threshold Pulse Width: 0.4 ms
Lead Channel Pacing Threshold Pulse Width: 0.4 ms
Lead Channel Sensing Intrinsic Amplitude: 2.125 mV
Lead Channel Sensing Intrinsic Amplitude: 2.125 mV
Lead Channel Sensing Intrinsic Amplitude: 3.875 mV
Lead Channel Sensing Intrinsic Amplitude: 3.875 mV
Lead Channel Setting Pacing Amplitude: 1.75 V
Lead Channel Setting Pacing Amplitude: 2 V
Lead Channel Setting Pacing Pulse Width: 0.4 ms
Lead Channel Setting Sensing Sensitivity: 0.9 mV
Zone Setting Status: 755011
Zone Setting Status: 755011

## 2023-06-25 NOTE — Progress Notes (Signed)
Remote pacemaker transmission.   

## 2023-08-18 ENCOUNTER — Ambulatory Visit: Payer: Medicare Other

## 2023-09-16 ENCOUNTER — Ambulatory Visit (INDEPENDENT_AMBULATORY_CARE_PROVIDER_SITE_OTHER): Payer: Medicare Other

## 2023-09-16 DIAGNOSIS — I495 Sick sinus syndrome: Secondary | ICD-10-CM | POA: Diagnosis not present

## 2023-09-16 LAB — CUP PACEART REMOTE DEVICE CHECK
Battery Remaining Longevity: 133 mo
Battery Voltage: 3.02 V
Brady Statistic AP VP Percent: 0.04 %
Brady Statistic AP VS Percent: 88.37 %
Brady Statistic AS VP Percent: 0.02 %
Brady Statistic AS VS Percent: 11.6 %
Brady Statistic RA Percent Paced: 81.39 %
Brady Statistic RV Percent Paced: 0.12 %
Date Time Interrogation Session: 20241126224140
Implantable Lead Connection Status: 753985
Implantable Lead Connection Status: 753985
Implantable Lead Implant Date: 20220131
Implantable Lead Implant Date: 20220131
Implantable Lead Location: 753859
Implantable Lead Location: 753860
Implantable Lead Model: 5076
Implantable Lead Model: 5076
Implantable Pulse Generator Implant Date: 20220131
Lead Channel Impedance Value: 285 Ohm
Lead Channel Impedance Value: 285 Ohm
Lead Channel Impedance Value: 323 Ohm
Lead Channel Impedance Value: 323 Ohm
Lead Channel Pacing Threshold Amplitude: 0.625 V
Lead Channel Pacing Threshold Amplitude: 0.75 V
Lead Channel Pacing Threshold Pulse Width: 0.4 ms
Lead Channel Pacing Threshold Pulse Width: 0.4 ms
Lead Channel Sensing Intrinsic Amplitude: 0.625 mV
Lead Channel Sensing Intrinsic Amplitude: 0.625 mV
Lead Channel Sensing Intrinsic Amplitude: 4.125 mV
Lead Channel Sensing Intrinsic Amplitude: 4.125 mV
Lead Channel Setting Pacing Amplitude: 1.5 V
Lead Channel Setting Pacing Amplitude: 2 V
Lead Channel Setting Pacing Pulse Width: 0.4 ms
Lead Channel Setting Sensing Sensitivity: 0.9 mV
Zone Setting Status: 755011
Zone Setting Status: 755011

## 2023-11-17 ENCOUNTER — Ambulatory Visit: Payer: Medicare Other

## 2023-12-16 ENCOUNTER — Ambulatory Visit (INDEPENDENT_AMBULATORY_CARE_PROVIDER_SITE_OTHER): Payer: Medicare Other

## 2023-12-16 DIAGNOSIS — I495 Sick sinus syndrome: Secondary | ICD-10-CM

## 2023-12-17 ENCOUNTER — Encounter (HOSPITAL_COMMUNITY): Payer: Self-pay | Admitting: Internal Medicine

## 2023-12-17 ENCOUNTER — Telehealth: Payer: Self-pay

## 2023-12-17 ENCOUNTER — Ambulatory Visit (HOSPITAL_COMMUNITY)
Admission: RE | Admit: 2023-12-17 | Discharge: 2023-12-17 | Disposition: A | Discharge status: Home / Self Care | Payer: Medicare Other | Source: Ambulatory Visit | Attending: Internal Medicine | Admitting: Internal Medicine

## 2023-12-17 VITALS — BP 162/100 | HR 134 | Ht 65.0 in | Wt 167.6 lb

## 2023-12-17 DIAGNOSIS — Z86711 Personal history of pulmonary embolism: Secondary | ICD-10-CM | POA: Diagnosis not present

## 2023-12-17 DIAGNOSIS — I495 Sick sinus syndrome: Secondary | ICD-10-CM | POA: Insufficient documentation

## 2023-12-17 DIAGNOSIS — I4892 Unspecified atrial flutter: Secondary | ICD-10-CM | POA: Diagnosis not present

## 2023-12-17 DIAGNOSIS — D6869 Other thrombophilia: Secondary | ICD-10-CM | POA: Diagnosis not present

## 2023-12-17 DIAGNOSIS — K219 Gastro-esophageal reflux disease without esophagitis: Secondary | ICD-10-CM | POA: Insufficient documentation

## 2023-12-17 DIAGNOSIS — Z7901 Long term (current) use of anticoagulants: Secondary | ICD-10-CM | POA: Diagnosis not present

## 2023-12-17 DIAGNOSIS — I129 Hypertensive chronic kidney disease with stage 1 through stage 4 chronic kidney disease, or unspecified chronic kidney disease: Secondary | ICD-10-CM | POA: Diagnosis not present

## 2023-12-17 DIAGNOSIS — E1122 Type 2 diabetes mellitus with diabetic chronic kidney disease: Secondary | ICD-10-CM | POA: Insufficient documentation

## 2023-12-17 DIAGNOSIS — Z79899 Other long term (current) drug therapy: Secondary | ICD-10-CM | POA: Diagnosis not present

## 2023-12-17 DIAGNOSIS — E785 Hyperlipidemia, unspecified: Secondary | ICD-10-CM | POA: Insufficient documentation

## 2023-12-17 DIAGNOSIS — N189 Chronic kidney disease, unspecified: Secondary | ICD-10-CM | POA: Diagnosis not present

## 2023-12-17 DIAGNOSIS — I4819 Other persistent atrial fibrillation: Secondary | ICD-10-CM

## 2023-12-17 DIAGNOSIS — Z794 Long term (current) use of insulin: Secondary | ICD-10-CM | POA: Diagnosis not present

## 2023-12-17 LAB — CUP PACEART REMOTE DEVICE CHECK
Battery Remaining Longevity: 131 mo
Battery Voltage: 3.02 V
Brady Statistic AP VP Percent: 0.06 %
Brady Statistic AP VS Percent: 39.18 %
Brady Statistic AS VP Percent: 0.09 %
Brady Statistic AS VS Percent: 60.94 %
Brady Statistic RA Percent Paced: 4.94 %
Brady Statistic RV Percent Paced: 4.65 %
Date Time Interrogation Session: 20250225231849
Implantable Lead Connection Status: 753985
Implantable Lead Connection Status: 753985
Implantable Lead Implant Date: 20220131
Implantable Lead Implant Date: 20220131
Implantable Lead Location: 753859
Implantable Lead Location: 753860
Implantable Lead Model: 5076
Implantable Lead Model: 5076
Implantable Pulse Generator Implant Date: 20220131
Lead Channel Impedance Value: 266 Ohm
Lead Channel Impedance Value: 285 Ohm
Lead Channel Impedance Value: 323 Ohm
Lead Channel Impedance Value: 342 Ohm
Lead Channel Pacing Threshold Amplitude: 0.5 V
Lead Channel Pacing Threshold Amplitude: 0.75 V
Lead Channel Pacing Threshold Pulse Width: 0.4 ms
Lead Channel Pacing Threshold Pulse Width: 0.4 ms
Lead Channel Sensing Intrinsic Amplitude: 0.625 mV
Lead Channel Sensing Intrinsic Amplitude: 0.625 mV
Lead Channel Sensing Intrinsic Amplitude: 4.125 mV
Lead Channel Sensing Intrinsic Amplitude: 4.125 mV
Lead Channel Setting Pacing Amplitude: 1.5 V
Lead Channel Setting Pacing Amplitude: 2 V
Lead Channel Setting Pacing Pulse Width: 0.4 ms
Lead Channel Setting Sensing Sensitivity: 0.9 mV
Zone Setting Status: 755011
Zone Setting Status: 755011

## 2023-12-17 LAB — BASIC METABOLIC PANEL
Anion gap: 11 (ref 5–15)
BUN: 26 mg/dL — ABNORMAL HIGH (ref 8–23)
CO2: 22 mmol/L (ref 22–32)
Calcium: 9.2 mg/dL (ref 8.9–10.3)
Chloride: 106 mmol/L (ref 98–111)
Creatinine, Ser: 1.15 mg/dL — ABNORMAL HIGH (ref 0.44–1.00)
GFR, Estimated: 46 mL/min — ABNORMAL LOW (ref >60)
Glucose, Bld: 132 mg/dL — ABNORMAL HIGH (ref 70–99)
Potassium: 3.9 mmol/L (ref 3.5–5.1)
Sodium: 139 mmol/L (ref 135–145)

## 2023-12-17 LAB — CBC
HCT: 38.6 % (ref 36.0–46.0)
Hemoglobin: 12.2 g/dL (ref 12.0–15.0)
MCH: 28.4 pg (ref 26.0–34.0)
MCHC: 31.6 g/dL (ref 30.0–36.0)
MCV: 89.8 fL (ref 80.0–100.0)
Platelets: 195 10*3/uL (ref 150–400)
RBC: 4.3 MIL/uL (ref 3.87–5.11)
RDW: 14.1 % (ref 11.5–15.5)
WBC: 6.2 10*3/uL (ref 4.0–10.5)
nRBC: 0 % (ref 0.0–0.2)

## 2023-12-17 MED ORDER — CARVEDILOL 3.125 MG PO TABS: 3.1250 mg | ORAL_TABLET | Freq: Two times a day (BID) | ORAL | 3 refills | Status: DC

## 2023-12-17 NOTE — Patient Instructions (Addendum)
 start Coreg 3.125mg  twice a day -- call with update of heart rates early next week [phone]    Cardioversion scheduled for: Monday, March 17th    - Arrive at the [facility] and go to admitting at 8:30am   - Do not eat or drink anything after midnight the night prior to your procedure.   - Take all your morning medication (except diabetic medications) with a sip of water prior to arrival.  - You will not be able to drive home after your procedure.    - Do NOT miss any doses of your blood thinner - if you should miss a dose please notify our office immediately.   - If you feel as if you go back into normal rhythm prior to scheduled cardioversion, please notify our office immediately.   If your procedure is canceled in the cardioversion suite you will be charged a cancellation fee.

## 2023-12-17 NOTE — Telephone Encounter (Signed)
 Pt returning nurse call. I told her the nurse will give her a call back.

## 2023-12-17 NOTE — Progress Notes (Signed)
 Primary Care Physician: Achilles Dunk, DO Primary Electrophysiologist: Dr Ladona Ridgel Referring Physician: Dr Vinnie Level is a 89 y.o. female with a history of atrial fibrillation, atrial flutter, diabetes, hyperlipidemia, hypertension, PE, GERD, glaucoma, CKD, and tachybradycardia syndrome s/p PPM who presents for follow up in the [facility]. Patient is on Eliquis for a CHADS2VASC score of 5. Patient presented to the hospital 11/17/20 after an episode of acute dizziness, nausea, and near syncope.  She was in her kitchen about 1230 preparing lunch.  Her daughter was called to check on her.  Her blood sugar was checked and was in the mid 70s.  Her pulse was checked and was was in the 30s and thus EMS was called.  She was transported to the emergency room.  She had previously been on metoprolol after a diagnosis of atrial fibrillation in 2020 when she was hospitalized with pneumonia. She did convert to atrial flutter while in the emergency room. She had a PPM implanted for tachybradycardia syndrome with plan to undergo DCCV as an outpatient.   On follow up today, the device clinic received an alert for elevated heart rates. Patient reports she remains fatigued and SOB with exertion. She denies any weight gain, PND, or orthopnea. She denies any missed doses of anticoagulation since leaving the hospital.   On follow up 12/17/23, she is currently in Afib with RVR. Device clinic alert today for ongoing Afib since 2/11 with burden 87.7% and poor ventricular rate control HR 120-150 bpm 50% of the time. Patient does not appear to have cardiac awareness. Daughter notes that majority of patient's HTN medications (including carvedilol) were discontinued by PCP. Review of PCP notes in Care Everywhere show 08/19/23 the diagnosis of hypotension and yeast infection; daughter notes the carvedilol was possibly discontinued at that office visit. She missed a dose of Eliquis last week.  She is on Eliquis 2.5 mg BID.   Today, she denies symptoms of palpitations, chest pain, orthopnea, PND, lower extremity edema, dizziness, presyncope, syncope, snoring, daytime somnolence, bleeding, or neurologic sequela. The patient is tolerating medications without difficulties and is otherwise without complaint today.    Atrial Fibrillation Risk Factors:  she does not have symptoms or diagnosis of sleep apnea.. she does not have a history of rheumatic fever.   she has a BMI of Body mass index is 27.89 kg/m.Marland Kitchen Filed Weights   12/17/23 1428  Weight: 76 kg     Family History  Problem Relation Age of Onset   Diabetes Mother    CVA Mother    Leukemia Father    Colon cancer Neg Hx    Stomach cancer Neg Hx    Pancreatic cancer Neg Hx    Esophageal cancer Neg Hx      Atrial Fibrillation Management history:  Previous antiarrhythmic drugs: none Previous cardioversions: none Previous ablations: none CHADS2VASC score: 5 Anticoagulation history: Eliquis   Past Medical History:  Diagnosis Date   Asthma due to environmental allergies    Basal cell carcinoma 05/08/2020   bcc ant. mid neck    Breast cancer (HCC)    2003   Cataract    Chronic kidney disease    Clotting disorder (HCC)    Diabetes mellitus (HCC)    type II   Diabetic coma (HCC)    GERD (gastroesophageal reflux disease)    Glaucoma    Hyperlipidemia    Hypertension    Osteoporosis    Pneumonia  Pulmonary embolism (HCC)    SCC (squamous cell carcinoma) 05/08/2021   well diff- left forearm-posterior (CX35FU)   SCC (squamous cell carcinoma) 05/08/2021   in situ- left zygomatic area (CX35FU)   Squamous cell carcinoma of skin 10/18/2014   well diff-right upper arm cx3 21fu   Squamous cell carcinoma of skin 06/02/2019   in situ-upper lip (txpbx)   Tubular adenoma of colon 2017   Past Surgical History:  Procedure Laterality Date   APPENDECTOMY     CATARACT EXTRACTION, BILATERAL     MASTECTOMY     left  w/ymph node removal   PACEMAKER IMPLANT N/A 11/19/2020   Procedure: PACEMAKER IMPLANT;  Surgeon: Duke Salvia, MD;  Location: [facility] INVASIVE CV LAB;  Service: Cardiovascular;  Laterality: N/A;   SKIN CANCER EXCISION     TONSILLECTOMY     VAGINAL HYSTERECTOMY  1981    Current Outpatient Medications  Medication Sig Dispense Refill   albuterol (VENTOLIN HFA) 108 (90 Base) MCG/ACT inhaler Inhale 2 puffs into the lungs every 6 (six) hours as needed for wheezing or shortness of breath (cough). 18 g 1   apixaban (ELIQUIS) 2.5 MG TABS tablet Take 1 tablet (2.5 mg total) by mouth 2 (two) times daily. 60 tablet 0   brimonidine (ALPHAGAN) 0.2 % ophthalmic solution Place 1 drop into both eyes 3 (three) times daily.     Calcium Carb-Cholecalciferol (CALCIUM 600-D PO) Take 1 tablet by mouth 2 (two) times daily.     denosumab (PROLIA) 60 MG/ML SOLN injection Inject 60 mg into the skin every 6 (six) months. Administer in upper arm, thigh, or abdomen     dorzolamide-timolol (COSOPT) 22.3-6.8 MG/ML ophthalmic solution Place 1 drop into both eyes 2 (two) times daily.     DULoxetine (CYMBALTA) 30 MG capsule Take by mouth.     esomeprazole (NEXIUM) 20 MG capsule Take 20 mg by mouth daily at 12 noon.     gabapentin (NEURONTIN) 100 MG capsule Take 1 capsule (100 mg total) by mouth 2 (two) times daily. 60 capsule 1   insulin degludec (TRESIBA FLEXTOUCH) 100 UNIT/ML FlexTouch Pen Inject 10-25 Units into the skin at bedtime. Based on CBG     insulin lispro (HUMALOG) 100 UNIT/ML KwikPen Inject 8-14 Units into the skin daily with lunch. Per sliding scale     latanoprost (XALATAN) 0.005 % ophthalmic solution Place 1 drop into both eyes at bedtime.     mirtazapine (REMERON) 7.5 MG tablet Take 7.5 mg by mouth at bedtime.     montelukast (SINGULAIR) 10 MG tablet Take 10 mg by mouth at bedtime.     Multiple Vitamins-Calcium (DAILY VITAMINS FOR WOMEN PO) Take by mouth. Women vitamin w/iron taking daily     oxybutynin  (DITROPAN) 5 MG tablet Take 5 mg by mouth 3 (three) times daily.     rosuvastatin (CRESTOR) 40 MG tablet Take 40 mg by mouth at bedtime.     carvedilol (COREG) 3.125 MG tablet Take 1 tablet (3.125 mg total) by mouth 2 (two) times daily with a meal. 60 tablet 3   No current facility-administered medications for this encounter.    Allergies  Allergen Reactions   Metformin Other (See Comments)    Caused stomach upset (tolerates timed release)    ROS- All systems are reviewed and negative except as per the HPI above.  Physical Exam: Vitals:   12/17/23 1428  BP: (!) 162/100  Pulse: (!) 134  Weight: 76 kg  Height: 5\' 5"  (1.651 m)  GEN- The patient is well appearing, alert and oriented x 3 today.   Neck - no JVD or carotid bruit noted Lungs- Clear to ausculation bilaterally, normal work of breathing Heart- Irregular tachycardic rate and rhythm, no murmurs, rubs or gallops, PMI not laterally displaced Extremities- no clubbing, cyanosis, or edema Skin - no rash or ecchymosis noted   Wt Readings from Last 3 Encounters:  12/17/23 76 kg  04/14/23 73.9 kg  04/01/22 75.8 kg    EKG today demonstrates  Vent. rate 134 BPM PR interval * ms QRS duration 88 ms QT/QTcB 266/397 ms P-R-T axes * 57 167 Atrial fibrillation with rapid ventricular response Cannot rule out Anterior infarct , age undetermined Abnormal ECG When compared with ECG of 14-Apr-2023 13:28, PREVIOUS ECG IS PRESENT  Echo 11/21/20 demonstrated  1. Left ventricular ejection fraction, by estimation, is 60 to 65%. The  left ventricle has normal function. The left ventricle has no regional  wall motion abnormalities. There is mild left ventricular hypertrophy.  Left ventricular diastolic parameters  are indeterminate.   2. Right ventricular systolic function is mildly reduced. The right  ventricular size is normal. There is mildly elevated pulmonary artery  systolic pressure. The estimated right ventricular systolic  pressure is  36.9 mmHg.   3. The mitral valve is normal in structure. Trivial mitral valve  regurgitation.   4. The aortic valve is tricuspid. Aortic valve regurgitation is not  visualized. Mild aortic valve sclerosis is present, with no evidence of  aortic valve stenosis.   5. The inferior vena cava is normal in size with greater than 50%  respiratory variability, suggesting right atrial pressure of 3 mmHg.   Epic records are reviewed at length today  CHA2DS2-VASc Score = 5  The patient's score is based upon: CHF History: 0 HTN History: 1 Diabetes History: 1 Stroke History: 0 Vascular Disease History: 0 Age Score: 2 Gender Score: 1        ASSESSMENT AND PLAN: 1. Persistent Atrial Fibrillation/atrial flutter The patient's CHA2DS2-VASc score is 5, indicating a 7.2% annual risk of stroke.    She is currently in Afib with RVR. Her carvedilol appears to have been lowered in dose and ultimately discontinued over the past several months. I will restart this today at coreg 3.125 mg BID. Daughter will contact office on Monday to determine if require titration of coreg. We discussed treatment options for her arrhythmia and I went over the risks vs benefits of cardioversion to try to convert to NSR. After discussion, patient agrees to proceed with cardioversion. Labs drawn today. She missed a dose of Eliquis last week; will schedule DCCV 3 weeks from 12/12/23.  Informed Consent   Shared Decision Making/Informed Consent The risks (stroke, cardiac arrhythmias rarely resulting in the need for a temporary or permanent pacemaker, skin irritation or burns and complications associated with conscious sedation including aspiration, arrhythmia, respiratory failure and death), benefits (restoration of normal sinus rhythm) and alternatives of a direct current cardioversion were explained in detail to Ms. Lounsbury and she agrees to proceed.      2. Secondary Hypercoagulable State (ICD10:  D68.69) The  patient is at significant risk for stroke/thromboembolism based upon her CHA2DS2-VASc Score of 5.  Continue Apixaban (Eliquis).  She missed a dose of Eliquis last week. Correct dose of 2.5 mg due to age and creatinine > 1.5.   3. Tachybradycardia syndrome S/p PPM, followed by Dr Ladona Ridgel and the device clinic.  4. HTN Elevated today while in Afib with  RVR. Recheck after she is back in NSR.    Follow up 2 weeks after cardioversion.   Justin Mend, PA-C Afib Clinic [facility] [address] [place], [place] [address] [phone] 12/17/2023 2:54 PM

## 2023-12-17 NOTE — Telephone Encounter (Signed)
 Alert received from [employer] for Ongoing AF since 12/01/2023, AF burden is 87.7%, on OAC according to PA, poor ventricular rate control, HRs are 120-150 bpm 50% of the time.  Attempted to contact patient to recommend AF clinic referral. LVMTCB.

## 2023-12-17 NOTE — Telephone Encounter (Signed)
 Spoke to patients daughter Misty Stanley who is on [place], states patient is asymptomatic. Recommended AF clinic referral considering elevated ventricular rates. Agreeable to AF clinic referral.    Please call Misty Stanley at [phone] for scheduling apts.

## 2023-12-17 NOTE — H&P (View-Only) (Signed)
 Primary Care Physician: Achilles Dunk, DO Primary Electrophysiologist: Dr Ladona Ridgel Referring Physician: Dr Vinnie Level is a 89 y.o. female with a history of atrial fibrillation, atrial flutter, diabetes, hyperlipidemia, hypertension, PE, GERD, glaucoma, CKD, and tachybradycardia syndrome s/p PPM who presents for follow up in the [facility]. Patient is on Eliquis for a CHADS2VASC score of 5. Patient presented to the hospital 11/17/20 after an episode of acute dizziness, nausea, and near syncope.  She was in her kitchen about 1230 preparing lunch.  Her daughter was called to check on her.  Her blood sugar was checked and was in the mid 70s.  Her pulse was checked and was was in the 30s and thus EMS was called.  She was transported to the emergency room.  She had previously been on metoprolol after a diagnosis of atrial fibrillation in 2020 when she was hospitalized with pneumonia. She did convert to atrial flutter while in the emergency room. She had a PPM implanted for tachybradycardia syndrome with plan to undergo DCCV as an outpatient.   On follow up today, the device clinic received an alert for elevated heart rates. Patient reports she remains fatigued and SOB with exertion. She denies any weight gain, PND, or orthopnea. She denies any missed doses of anticoagulation since leaving the hospital.   On follow up 12/17/23, she is currently in Afib with RVR. Device clinic alert today for ongoing Afib since 2/11 with burden 87.7% and poor ventricular rate control HR 120-150 bpm 50% of the time. Patient does not appear to have cardiac awareness. Daughter notes that majority of patient's HTN medications (including carvedilol) were discontinued by PCP. Review of PCP notes in Care Everywhere show 08/19/23 the diagnosis of hypotension and yeast infection; daughter notes the carvedilol was possibly discontinued at that office visit. She missed a dose of Eliquis last week.  She is on Eliquis 2.5 mg BID.   Today, she denies symptoms of palpitations, chest pain, orthopnea, PND, lower extremity edema, dizziness, presyncope, syncope, snoring, daytime somnolence, bleeding, or neurologic sequela. The patient is tolerating medications without difficulties and is otherwise without complaint today.    Atrial Fibrillation Risk Factors:  she does not have symptoms or diagnosis of sleep apnea.. she does not have a history of rheumatic fever.   she has a BMI of Body mass index is 27.89 kg/m.Marland Kitchen Filed Weights   12/17/23 1428  Weight: 76 kg     Family History  Problem Relation Age of Onset   Diabetes Mother    CVA Mother    Leukemia Father    Colon cancer Neg Hx    Stomach cancer Neg Hx    Pancreatic cancer Neg Hx    Esophageal cancer Neg Hx      Atrial Fibrillation Management history:  Previous antiarrhythmic drugs: none Previous cardioversions: none Previous ablations: none CHADS2VASC score: 5 Anticoagulation history: Eliquis   Past Medical History:  Diagnosis Date   Asthma due to environmental allergies    Basal cell carcinoma 05/08/2020   bcc ant. mid neck    Breast cancer (HCC)    2003   Cataract    Chronic kidney disease    Clotting disorder (HCC)    Diabetes mellitus (HCC)    type II   Diabetic coma (HCC)    GERD (gastroesophageal reflux disease)    Glaucoma    Hyperlipidemia    Hypertension    Osteoporosis    Pneumonia  Pulmonary embolism (HCC)    SCC (squamous cell carcinoma) 05/08/2021   well diff- left forearm-posterior (CX35FU)   SCC (squamous cell carcinoma) 05/08/2021   in situ- left zygomatic area (CX35FU)   Squamous cell carcinoma of skin 10/18/2014   well diff-right upper arm cx3 21fu   Squamous cell carcinoma of skin 06/02/2019   in situ-upper lip (txpbx)   Tubular adenoma of colon 2017   Past Surgical History:  Procedure Laterality Date   APPENDECTOMY     CATARACT EXTRACTION, BILATERAL     MASTECTOMY     left  w/ymph node removal   PACEMAKER IMPLANT N/A 11/19/2020   Procedure: PACEMAKER IMPLANT;  Surgeon: Duke Salvia, MD;  Location: [facility] INVASIVE CV LAB;  Service: Cardiovascular;  Laterality: N/A;   SKIN CANCER EXCISION     TONSILLECTOMY     VAGINAL HYSTERECTOMY  1981    Current Outpatient Medications  Medication Sig Dispense Refill   albuterol (VENTOLIN HFA) 108 (90 Base) MCG/ACT inhaler Inhale 2 puffs into the lungs every 6 (six) hours as needed for wheezing or shortness of breath (cough). 18 g 1   apixaban (ELIQUIS) 2.5 MG TABS tablet Take 1 tablet (2.5 mg total) by mouth 2 (two) times daily. 60 tablet 0   brimonidine (ALPHAGAN) 0.2 % ophthalmic solution Place 1 drop into both eyes 3 (three) times daily.     Calcium Carb-Cholecalciferol (CALCIUM 600-D PO) Take 1 tablet by mouth 2 (two) times daily.     denosumab (PROLIA) 60 MG/ML SOLN injection Inject 60 mg into the skin every 6 (six) months. Administer in upper arm, thigh, or abdomen     dorzolamide-timolol (COSOPT) 22.3-6.8 MG/ML ophthalmic solution Place 1 drop into both eyes 2 (two) times daily.     DULoxetine (CYMBALTA) 30 MG capsule Take by mouth.     esomeprazole (NEXIUM) 20 MG capsule Take 20 mg by mouth daily at 12 noon.     gabapentin (NEURONTIN) 100 MG capsule Take 1 capsule (100 mg total) by mouth 2 (two) times daily. 60 capsule 1   insulin degludec (TRESIBA FLEXTOUCH) 100 UNIT/ML FlexTouch Pen Inject 10-25 Units into the skin at bedtime. Based on CBG     insulin lispro (HUMALOG) 100 UNIT/ML KwikPen Inject 8-14 Units into the skin daily with lunch. Per sliding scale     latanoprost (XALATAN) 0.005 % ophthalmic solution Place 1 drop into both eyes at bedtime.     mirtazapine (REMERON) 7.5 MG tablet Take 7.5 mg by mouth at bedtime.     montelukast (SINGULAIR) 10 MG tablet Take 10 mg by mouth at bedtime.     Multiple Vitamins-Calcium (DAILY VITAMINS FOR WOMEN PO) Take by mouth. Women vitamin w/iron taking daily     oxybutynin  (DITROPAN) 5 MG tablet Take 5 mg by mouth 3 (three) times daily.     rosuvastatin (CRESTOR) 40 MG tablet Take 40 mg by mouth at bedtime.     carvedilol (COREG) 3.125 MG tablet Take 1 tablet (3.125 mg total) by mouth 2 (two) times daily with a meal. 60 tablet 3   No current facility-administered medications for this encounter.    Allergies  Allergen Reactions   Metformin Other (See Comments)    Caused stomach upset (tolerates timed release)    ROS- All systems are reviewed and negative except as per the HPI above.  Physical Exam: Vitals:   12/17/23 1428  BP: (!) 162/100  Pulse: (!) 134  Weight: 76 kg  Height: 5\' 5"  (1.651 m)  GEN- The patient is well appearing, alert and oriented x 3 today.   Neck - no JVD or carotid bruit noted Lungs- Clear to ausculation bilaterally, normal work of breathing Heart- Irregular tachycardic rate and rhythm, no murmurs, rubs or gallops, PMI not laterally displaced Extremities- no clubbing, cyanosis, or edema Skin - no rash or ecchymosis noted   Wt Readings from Last 3 Encounters:  12/17/23 76 kg  04/14/23 73.9 kg  04/01/22 75.8 kg    EKG today demonstrates  Vent. rate 134 BPM PR interval * ms QRS duration 88 ms QT/QTcB 266/397 ms P-R-T axes * 57 167 Atrial fibrillation with rapid ventricular response Cannot rule out Anterior infarct , age undetermined Abnormal ECG When compared with ECG of 14-Apr-2023 13:28, PREVIOUS ECG IS PRESENT  Echo 11/21/20 demonstrated  1. Left ventricular ejection fraction, by estimation, is 60 to 65%. The  left ventricle has normal function. The left ventricle has no regional  wall motion abnormalities. There is mild left ventricular hypertrophy.  Left ventricular diastolic parameters  are indeterminate.   2. Right ventricular systolic function is mildly reduced. The right  ventricular size is normal. There is mildly elevated pulmonary artery  systolic pressure. The estimated right ventricular systolic  pressure is  36.9 mmHg.   3. The mitral valve is normal in structure. Trivial mitral valve  regurgitation.   4. The aortic valve is tricuspid. Aortic valve regurgitation is not  visualized. Mild aortic valve sclerosis is present, with no evidence of  aortic valve stenosis.   5. The inferior vena cava is normal in size with greater than 50%  respiratory variability, suggesting right atrial pressure of 3 mmHg.   Epic records are reviewed at length today  CHA2DS2-VASc Score = 5  The patient's score is based upon: CHF History: 0 HTN History: 1 Diabetes History: 1 Stroke History: 0 Vascular Disease History: 0 Age Score: 2 Gender Score: 1        ASSESSMENT AND PLAN: 1. Persistent Atrial Fibrillation/atrial flutter The patient's CHA2DS2-VASc score is 5, indicating a 7.2% annual risk of stroke.    She is currently in Afib with RVR. Her carvedilol appears to have been lowered in dose and ultimately discontinued over the past several months. I will restart this today at coreg 3.125 mg BID. Daughter will contact office on Monday to determine if require titration of coreg. We discussed treatment options for her arrhythmia and I went over the risks vs benefits of cardioversion to try to convert to NSR. After discussion, patient agrees to proceed with cardioversion. Labs drawn today. She missed a dose of Eliquis last week; will schedule DCCV 3 weeks from 12/12/23.  Informed Consent   Shared Decision Making/Informed Consent The risks (stroke, cardiac arrhythmias rarely resulting in the need for a temporary or permanent pacemaker, skin irritation or burns and complications associated with conscious sedation including aspiration, arrhythmia, respiratory failure and death), benefits (restoration of normal sinus rhythm) and alternatives of a direct current cardioversion were explained in detail to Ms. Lounsbury and she agrees to proceed.      2. Secondary Hypercoagulable State (ICD10:  D68.69) The  patient is at significant risk for stroke/thromboembolism based upon her CHA2DS2-VASc Score of 5.  Continue Apixaban (Eliquis).  She missed a dose of Eliquis last week. Correct dose of 2.5 mg due to age and creatinine > 1.5.   3. Tachybradycardia syndrome S/p PPM, followed by Dr Ladona Ridgel and the device clinic.  4. HTN Elevated today while in Afib with  RVR. Recheck after she is back in NSR.    Follow up 2 weeks after cardioversion.   Justin Mend, PA-C Afib Clinic [facility] [address] [place], [place] [address] [phone] 12/17/2023 2:54 PM

## 2023-12-20 ENCOUNTER — Encounter: Payer: Self-pay | Admitting: Internal Medicine

## 2023-12-21 ENCOUNTER — Telehealth (HOSPITAL_COMMUNITY): Payer: Self-pay | Admitting: *Deleted

## 2023-12-21 MED ORDER — CARVEDILOL 3.125 MG PO TABS: ORAL_TABLET | ORAL | Status: DC

## 2023-12-21 NOTE — Telephone Encounter (Signed)
 Patient daughter, Misty Stanley, called in with BP/HR readings since starting coreg 3.125mg  BID. 2/28- 131/98 HR 87 3/1 - 119/94 HR 114 3/2 - 109/74 HR 115 3/3 - 145/100 HR 112  Per Landry Mellow PA will increase coreg to 6.25mg  in the morning and 3.125mg  in the evening.   Daughter to call back on Friday with response.

## 2023-12-29 NOTE — Telephone Encounter (Signed)
 BP/HR ranging from 138-193/88-122  HR 93-124 these readings are all before taking her medications. Pt daughter to check BP/HR 1-2 hours after medications and call with readings later this week. Pt daughter to call back .

## 2024-01-01 NOTE — Progress Notes (Signed)
 Left detailed VM with daughter, Misty Stanley and instructed them to come at 0830  and to be NPO after 0000.  Medications reviewed and instructions given.   Advised that patient will need a ride home and someone to stay with them for 24 hours after the procedure.

## 2024-01-04 ENCOUNTER — Ambulatory Visit (HOSPITAL_COMMUNITY): Admitting: Anesthesiology

## 2024-01-04 ENCOUNTER — Ambulatory Visit (HOSPITAL_COMMUNITY)
Admission: RE | Admit: 2024-01-04 | Discharge: 2024-01-04 | Disposition: A | Discharge status: Home / Self Care | Payer: Medicare Other | Source: Ambulatory Visit | Attending: Internal Medicine | Admitting: Internal Medicine

## 2024-01-04 ENCOUNTER — Encounter (HOSPITAL_COMMUNITY): Payer: Self-pay | Admitting: Internal Medicine

## 2024-01-04 ENCOUNTER — Encounter (HOSPITAL_COMMUNITY)
Admission: RE | Disposition: A | Discharge status: Home / Self Care | Payer: Self-pay | Source: Ambulatory Visit | Attending: Internal Medicine

## 2024-01-04 ENCOUNTER — Other Ambulatory Visit: Payer: Self-pay

## 2024-01-04 DIAGNOSIS — K219 Gastro-esophageal reflux disease without esophagitis: Secondary | ICD-10-CM | POA: Diagnosis not present

## 2024-01-04 DIAGNOSIS — N184 Chronic kidney disease, stage 4 (severe): Secondary | ICD-10-CM | POA: Diagnosis not present

## 2024-01-04 DIAGNOSIS — Z794 Long term (current) use of insulin: Secondary | ICD-10-CM | POA: Insufficient documentation

## 2024-01-04 DIAGNOSIS — I129 Hypertensive chronic kidney disease with stage 1 through stage 4 chronic kidney disease, or unspecified chronic kidney disease: Secondary | ICD-10-CM

## 2024-01-04 DIAGNOSIS — Z7901 Long term (current) use of anticoagulants: Secondary | ICD-10-CM | POA: Diagnosis not present

## 2024-01-04 DIAGNOSIS — Z833 Family history of diabetes mellitus: Secondary | ICD-10-CM | POA: Diagnosis not present

## 2024-01-04 DIAGNOSIS — E1122 Type 2 diabetes mellitus with diabetic chronic kidney disease: Secondary | ICD-10-CM | POA: Insufficient documentation

## 2024-01-04 DIAGNOSIS — N189 Chronic kidney disease, unspecified: Secondary | ICD-10-CM | POA: Insufficient documentation

## 2024-01-04 DIAGNOSIS — I4819 Other persistent atrial fibrillation: Secondary | ICD-10-CM | POA: Diagnosis present

## 2024-01-04 DIAGNOSIS — I4891 Unspecified atrial fibrillation: Secondary | ICD-10-CM | POA: Diagnosis not present

## 2024-01-04 DIAGNOSIS — J45909 Unspecified asthma, uncomplicated: Secondary | ICD-10-CM | POA: Diagnosis not present

## 2024-01-04 HISTORY — PX: CARDIOVERSION: EP1203

## 2024-01-04 SURGERY — CARDIOVERSION (CATH LAB)
Anesthesia: General

## 2024-01-04 MED ORDER — SODIUM CHLORIDE 0.9% FLUSH: 3.0000 mL | INTRAVENOUS | Status: DC | PRN

## 2024-01-04 MED ORDER — PROPOFOL 10 MG/ML IV BOLUS
INTRAVENOUS | Status: DC | PRN
  Administered 2024-01-04: 60 mg via INTRAVENOUS

## 2024-01-04 MED ORDER — SODIUM CHLORIDE 0.9% FLUSH
3.0000 mL | Freq: Two times a day (BID) | INTRAVENOUS | Status: DC
Start: 2024-01-04 — End: 2024-01-04

## 2024-01-04 SURGICAL SUPPLY — 1 items: PAD DEFIB RADIO PHYSIO CONN (PAD) ×1 IMPLANT

## 2024-01-04 NOTE — Transfer of Care (Signed)
 Immediate Anesthesia Transfer of Care Note  Patient: Kristen Fox  Procedure(s) Performed: CARDIOVERSION  Patient Location: PACU  Anesthesia Type:MAC  Level of Consciousness: drowsy  Airway & Oxygen Therapy: Patient Spontanous Breathing and Patient connected to nasal cannula oxygen  Post-op Assessment: Report given to RN and Post -op Vital signs reviewed and stable  Post vital signs: Reviewed and stable  Last Vitals:  Vitals Value Taken Time  BP    Temp    Pulse    Resp    SpO2      Last Pain:  Vitals:   01/04/24 0845  TempSrc:   PainSc: 0-No pain         Complications: No notable events documented.

## 2024-01-04 NOTE — Anesthesia Preprocedure Evaluation (Signed)
 Anesthesia Evaluation  Patient identified by MRN, date of birth, ID band Patient awake    Reviewed: Allergy & Precautions, H&P , NPO status , Patient's Chart, lab work & pertinent test results  Airway Mallampati: II   Neck ROM: full    Dental   Pulmonary asthma    breath sounds clear to auscultation       Cardiovascular hypertension, + dysrhythmias Atrial Fibrillation  Rhythm:irregular Rate:Normal     Neuro/Psych    GI/Hepatic ,GERD  ,,  Endo/Other  diabetes, Type 2    Renal/GU Renal disease     Musculoskeletal   Abdominal   Peds  Hematology   Anesthesia Other Findings   Reproductive/Obstetrics                             Anesthesia Physical Anesthesia Plan  ASA: 3  Anesthesia Plan: General   Post-op Pain Management:    Induction: Intravenous  PONV Risk Score and Plan: 3 and Propofol infusion and Treatment may vary due to age or medical condition  Airway Management Planned: Nasal Cannula  Additional Equipment:   Intra-op Plan:   Post-operative Plan:   Informed Consent: I have reviewed the patients History and Physical, chart, labs and discussed the procedure including the risks, benefits and alternatives for the proposed anesthesia with the patient or authorized representative who has indicated his/her understanding and acceptance.     Dental advisory given  Plan Discussed with: CRNA, Anesthesiologist and Surgeon  Anesthesia Plan Comments:        Anesthesia Quick Evaluation

## 2024-01-04 NOTE — Anesthesia Postprocedure Evaluation (Signed)
 Anesthesia Post Note  Patient: Kristen Fox  Procedure(s) Performed: CARDIOVERSION     Patient location during evaluation: Cath Lab Anesthesia Type: General Level of consciousness: awake and alert Pain management: pain level controlled Vital Signs Assessment: post-procedure vital signs reviewed and stable Respiratory status: spontaneous breathing, nonlabored ventilation, respiratory function stable and patient connected to nasal cannula oxygen Cardiovascular status: blood pressure returned to baseline and stable Postop Assessment: no apparent nausea or vomiting Anesthetic complications: no   No notable events documented.  Last Vitals:  Vitals:   01/04/24 0935 01/04/24 0940  BP: (!) 111/59 103/68  Pulse: 69 70  Resp: 16 (!) 22  Temp:    SpO2: 94% 97%    Last Pain:  Vitals:   01/04/24 0940  TempSrc:   PainSc: 0-No pain                 Keyosha Tiedt S

## 2024-01-04 NOTE — Interval H&P Note (Signed)
 History and Physical Interval Note:  01/04/2024 8:51 AM  Kristen Fox  has presented today for surgery, with the diagnosis of AFIB.  The various methods of treatment have been discussed with the patient and family. After consideration of risks, benefits and other options for treatment, the patient has consented to  Procedure(s): CARDIOVERSION (N/A) as a surgical intervention.  The patient's history has been reviewed, patient examined, no change in status, stable for surgery.  I have reviewed the patient's chart and labs.  Questions were answered to the patient's satisfaction.     [name]

## 2024-01-04 NOTE — Interval H&P Note (Signed)
 History and Physical Interval Note:  01/04/2024 9:10 AM  Kristen Fox  has presented today for surgery, with the diagnosis of AFIB.  The various methods of treatment have been discussed with the patient and family. After consideration of risks, benefits and other options for treatment, the patient has consented to  Procedure(s): CARDIOVERSION (N/A) as a surgical intervention.  The patient's history has been reviewed, patient examined, no change in status, stable for surgery.  I have reviewed the patient's chart and labs.  Questions were answered to the patient's satisfaction.     [name]

## 2024-01-04 NOTE — CV Procedure (Signed)
 Procedure: Electrical Cardioversion Indications:  Atrial Fibrillation  Procedure Details:  Consent: Risks of procedure as well as the alternatives and risks of each were explained to the (patient/caregiver).  Consent for procedure obtained.  Time Out: Verified patient identification, verified procedure, site/side was marked, verified correct patient position, special equipment/implants available, medications/allergies/relevent history reviewed, required imaging and test results available. PERFORMED.  Patient placed on cardiac monitor, pulse oximetry, supplemental oxygen as necessary.  Sedation given:  propofol Pacer pads placed anterior and posterior chest.  Cardioverted 1 time(s).  Cardioversion with synchronized biphasic 360J shock.  Evaluation: Findings: Post procedure EKG shows:  A paced, V sensed Complications: None Patient did tolerate procedure well.  Time Spent Directly with the Patient:  15 minutes   [name] 01/04/2024, 9:10 AM

## 2024-01-20 NOTE — Progress Notes (Signed)
 Remote pacemaker transmission.

## 2024-01-20 NOTE — Addendum Note (Signed)
 Addended by: Elease Etienne A on: 01/20/2024 10:12 AM   Modules accepted: Orders

## 2024-01-21 ENCOUNTER — Ambulatory Visit (HOSPITAL_COMMUNITY)
Admission: RE | Admit: 2024-01-21 | Discharge: 2024-01-21 | Disposition: A | Discharge status: Home / Self Care | Payer: Medicare Other | Source: Ambulatory Visit | Attending: Internal Medicine | Admitting: Internal Medicine

## 2024-01-21 VITALS — BP 168/72 | HR 78 | Ht 65.0 in | Wt 160.8 lb

## 2024-01-21 DIAGNOSIS — D6869 Other thrombophilia: Secondary | ICD-10-CM | POA: Diagnosis not present

## 2024-01-21 DIAGNOSIS — Z833 Family history of diabetes mellitus: Secondary | ICD-10-CM | POA: Diagnosis not present

## 2024-01-21 DIAGNOSIS — Z7901 Long term (current) use of anticoagulants: Secondary | ICD-10-CM | POA: Diagnosis not present

## 2024-01-21 DIAGNOSIS — I4819 Other persistent atrial fibrillation: Secondary | ICD-10-CM | POA: Diagnosis not present

## 2024-01-21 DIAGNOSIS — N189 Chronic kidney disease, unspecified: Secondary | ICD-10-CM | POA: Insufficient documentation

## 2024-01-21 DIAGNOSIS — I129 Hypertensive chronic kidney disease with stage 1 through stage 4 chronic kidney disease, or unspecified chronic kidney disease: Secondary | ICD-10-CM | POA: Diagnosis not present

## 2024-01-21 DIAGNOSIS — I495 Sick sinus syndrome: Secondary | ICD-10-CM | POA: Diagnosis not present

## 2024-01-21 DIAGNOSIS — Z794 Long term (current) use of insulin: Secondary | ICD-10-CM | POA: Insufficient documentation

## 2024-01-21 DIAGNOSIS — E1122 Type 2 diabetes mellitus with diabetic chronic kidney disease: Secondary | ICD-10-CM | POA: Diagnosis not present

## 2024-01-21 DIAGNOSIS — I4892 Unspecified atrial flutter: Secondary | ICD-10-CM | POA: Insufficient documentation

## 2024-01-21 DIAGNOSIS — E785 Hyperlipidemia, unspecified: Secondary | ICD-10-CM | POA: Diagnosis not present

## 2024-01-21 DIAGNOSIS — Z79899 Other long term (current) drug therapy: Secondary | ICD-10-CM | POA: Diagnosis not present

## 2024-01-21 DIAGNOSIS — Z950 Presence of cardiac pacemaker: Secondary | ICD-10-CM | POA: Diagnosis not present

## 2024-01-21 NOTE — Progress Notes (Signed)
 Primary Care Physician: Elise Benne, MD Primary Electrophysiologist: Dr Ladona Ridgel Referring Physician: Dr Vinnie Level is a 89 y.o. female with a history of atrial fibrillation, atrial flutter, diabetes, hyperlipidemia, hypertension, PE, GERD, glaucoma, CKD, and tachybradycardia syndrome s/p PPM who presents for follow up in the [facility]. Patient is on Eliquis for a CHADS2VASC score of 5. Patient presented to the hospital 11/17/20 after an episode of acute dizziness, nausea, and near syncope.  She was in her kitchen about 1230 preparing lunch.  Her daughter was called to check on her.  Her blood sugar was checked and was in the mid 70s.  Her pulse was checked and was was in the 30s and thus EMS was called.  She was transported to the emergency room.  She had previously been on metoprolol after a diagnosis of atrial fibrillation in 2020 when she was hospitalized with pneumonia. She did convert to atrial flutter while in the emergency room. She had a PPM implanted for tachybradycardia syndrome with plan to undergo DCCV as an outpatient.   On follow up today, the device clinic received an alert for elevated heart rates. Patient reports she remains fatigued and SOB with exertion. She denies any weight gain, PND, or orthopnea. She denies any missed doses of anticoagulation since leaving the hospital.   On follow up 12/17/23, she is currently in Afib with RVR. Device clinic alert today for ongoing Afib since 2/11 with burden 87.7% and poor ventricular rate control HR 120-150 bpm 50% of the time. Patient does not appear to have cardiac awareness. Daughter notes that majority of patient's HTN medications (including carvedilol) were discontinued by PCP. Review of PCP notes in Care Everywhere show 08/19/23 the diagnosis of hypotension and yeast infection; daughter notes the carvedilol was possibly discontinued at that office visit. She missed a dose of Eliquis last  week. She is on Eliquis 2.5 mg BID.   On follow up 01/21/24, she is currently in AV paced rhythm. S/p successful DCCV on 01/04/24. She feels better overall. No missed doses of Eliquis 2.5 mg BID.   Today, she denies symptoms of palpitations, chest pain, orthopnea, PND, lower extremity edema, dizziness, presyncope, syncope, snoring, daytime somnolence, bleeding, or neurologic sequela. The patient is tolerating medications without difficulties and is otherwise without complaint today.    Atrial Fibrillation Risk Factors:  she does not have symptoms or diagnosis of sleep apnea.. she does not have a history of rheumatic fever.   she has a BMI of Body mass index is 26.76 kg/m.Marland Kitchen Filed Weights   01/21/24 1329  Weight: 72.9 kg     Family History  Problem Relation Age of Onset   Diabetes Mother    CVA Mother    Leukemia Father    Colon cancer Neg Hx    Stomach cancer Neg Hx    Pancreatic cancer Neg Hx    Esophageal cancer Neg Hx      Atrial Fibrillation Management history:  Previous antiarrhythmic drugs: none Previous cardioversions: 01/04/24 Previous ablations: none CHADS2VASC score: 5 Anticoagulation history: Eliquis   Past Medical History:  Diagnosis Date   Asthma due to environmental allergies    Basal cell carcinoma 05/08/2020   bcc ant. mid neck    Breast cancer (HCC)    2003   Cataract    Chronic kidney disease    Clotting disorder (HCC)    Diabetes mellitus (HCC)    type II   Diabetic coma (  HCC)    GERD (gastroesophageal reflux disease)    Glaucoma    Hyperlipidemia    Hypertension    Osteoporosis    Pneumonia    Pulmonary embolism (HCC)    SCC (squamous cell carcinoma) 05/08/2021   well diff- left forearm-posterior (CX35FU)   SCC (squamous cell carcinoma) 05/08/2021   in situ- left zygomatic area (CX35FU)   Squamous cell carcinoma of skin 10/18/2014   well diff-right upper arm [id number]   Squamous cell carcinoma of skin 06/02/2019   in situ-upper lip  (txpbx)   Tubular adenoma of colon 2017   Past Surgical History:  Procedure Laterality Date   APPENDECTOMY     CARDIOVERSION N/A 01/04/2024   Procedure: CARDIOVERSION;  Surgeon: [name], MD;  Location: [facility];  Service: Cardiovascular;  Laterality: N/A;   CATARACT EXTRACTION, BILATERAL     MASTECTOMY     left w/ymph node removal   PACEMAKER IMPLANT N/A 11/19/2020   Procedure: PACEMAKER IMPLANT;  Surgeon: Duke Salvia, MD;  Location: [facility] CV LAB;  Service: Cardiovascular;  Laterality: N/A;   SKIN CANCER EXCISION     TONSILLECTOMY     VAGINAL HYSTERECTOMY  1981    Current Outpatient Medications  Medication Sig Dispense Refill   ammonium lactate (AMLACTIN) 12 % cream Apply 1 Application topically daily. LATHER ONTO BACK, ARMS, AND LEGS ONCE A DAY AFTER SHOWERING (AVOID FACE, OPEN AREAS, AND GROIN AREA)     apixaban (ELIQUIS) 2.5 MG TABS tablet Take 1 tablet (2.5 mg total) by mouth 2 (two) times daily. 60 tablet 0   brimonidine (ALPHAGAN) 0.2 % ophthalmic solution Place 1 drop into both eyes 3 (three) times daily.     carvedilol (COREG) 3.125 MG tablet Take 6.25mg  in the AM and 3.125mg  in the PM     denosumab (PROLIA) 60 MG/ML SOLN injection Inject 60 mg into the skin every 6 (six) months. Administer in upper arm, thigh, or abdomen     dorzolamide-timolol (COSOPT) 22.3-6.8 MG/ML ophthalmic solution Place 1 drop into both eyes 2 (two) times daily.     DULoxetine (CYMBALTA) 30 MG capsule Take 30 mg by mouth in the morning.     ferrous sulfate 325 (65 FE) MG tablet Take 325 mg by mouth every other day.     gabapentin (NEURONTIN) 100 MG capsule Take 1 capsule (100 mg total) by mouth 2 (two) times daily. 60 capsule 1   insulin degludec (TRESIBA FLEXTOUCH) 100 UNIT/ML FlexTouch Pen Inject 10-25 Units into the skin at bedtime. Based on CBG     insulin lispro (HUMALOG) 100 UNIT/ML KwikPen Inject 0-15 Units into the skin with breakfast, with lunch, and with evening meal.  Per sliding scale     latanoprost (XALATAN) 0.005 % ophthalmic solution Place 1 drop into both eyes at bedtime.     mirtazapine (REMERON) 7.5 MG tablet Take 7.5 mg by mouth at bedtime.     montelukast (SINGULAIR) 10 MG tablet Take 10 mg by mouth at bedtime.     Multiple Vitamins-Calcium (DAILY VITAMINS FOR WOMEN PO) Take 1 tablet by mouth in the morning. Women vitamin w/iron taking daily     oxybutynin (DITROPAN-XL) 5 MG 24 hr tablet Take 5 mg by mouth at bedtime.     rosuvastatin (CRESTOR) 40 MG tablet Take 40 mg by mouth at bedtime.     sertraline (ZOLOFT) 50 MG tablet Take 50 mg by mouth daily.     tamsulosin (FLOMAX) 0.4 MG CAPS  capsule Take 0.4 mg by mouth daily.     No current facility-administered medications for this encounter.    Allergies  Allergen Reactions   Metformin Other (See Comments)    Caused stomach upset (tolerates timed release)    ROS- All systems are reviewed and negative except as per the HPI above.  Physical Exam: Vitals:   01/21/24 1329  BP: (!) 168/72  Pulse: 78  Weight: 72.9 kg  Height: 5\' 5"  (1.651 m)    GEN- The patient is well appearing, alert and oriented x 3 today.   Neck - no JVD or carotid bruit noted Lungs- Clear to ausculation bilaterally, normal work of breathing Heart- Regular rate and rhythm, no murmurs, rubs or gallops, PMI not laterally displaced Extremities- no clubbing, cyanosis, or edema Skin - no rash or ecchymosis noted   Wt Readings from Last 3 Encounters:  01/21/24 72.9 kg  01/04/24 74.8 kg  12/17/23 76 kg    EKG today demonstrates  Vent. rate 78 BPM PR interval 354 ms QRS duration 88 ms QT/QTcB 394/449 ms P-R-T axes * 94 47 Atrial-paced rhythm with prolonged AV conduction Rightward axis Possible Anterior infarct , age undetermined Abnormal ECG When compared with ECG of 04-Jan-2024 09:16, PREVIOUS ECG IS PRESENT  Echo 11/21/20 demonstrated  1. Left ventricular ejection fraction, by estimation, is 60 to 65%. The   left ventricle has normal function. The left ventricle has no regional  wall motion abnormalities. There is mild left ventricular hypertrophy.  Left ventricular diastolic parameters  are indeterminate.   2. Right ventricular systolic function is mildly reduced. The right  ventricular size is normal. There is mildly elevated pulmonary artery  systolic pressure. The estimated right ventricular systolic pressure is  36.9 mmHg.   3. The mitral valve is normal in structure. Trivial mitral valve  regurgitation.   4. The aortic valve is tricuspid. Aortic valve regurgitation is not  visualized. Mild aortic valve sclerosis is present, with no evidence of  aortic valve stenosis.   5. The inferior vena cava is normal in size with greater than 50%  respiratory variability, suggesting right atrial pressure of 3 mmHg.   Epic records are reviewed at length today  CHA2DS2-VASc Score = 5  The patient's score is based upon: CHF History: 0 HTN History: 1 Diabetes History: 1 Stroke History: 0 Vascular Disease History: 0 Age Score: 2 Gender Score: 1       ASSESSMENT AND PLAN: 1. Persistent Atrial Fibrillation/atrial flutter The patient's CHA2DS2-VASc score is 5, indicating a 7.2% annual risk of stroke.   S/p successful DCCV on 01/04/24.  She is currently in AV paced rhythm. Continue coreg 3.125 mg BID.   2. Secondary Hypercoagulable State (ICD10:  D68.69) The patient is at significant risk for stroke/thromboembolism based upon her CHA2DS2-VASc Score of 5.  Continue Apixaban (Eliquis).  Continue Eliquis 2.5 mg BID without interruption. Correct dose of 2.5 mg due to age and creatinine > 1.5.   3. Tachybradycardia syndrome S/p PPM, followed by Dr Ladona Ridgel and the device clinic.  4. HTN Elevated today; patient notes BP likely elevated due to stressful situation with older sister falling on Monday and undergoing hip surgery last night. Daughter will have her trend BP 3 times a week and call us in  3 weeks with recordings. If hypotensive, will reduce coreg.     Follow up Afib clinic prn .    Justin Mend, PA-C [facility] [address] [place], [place] [address]  [phone] 01/21/2024 2:04 PM

## 2024-02-16 ENCOUNTER — Ambulatory Visit: Payer: Medicare Other

## 2024-03-16 ENCOUNTER — Ambulatory Visit: Payer: Medicare Other

## 2024-03-16 DIAGNOSIS — I495 Sick sinus syndrome: Secondary | ICD-10-CM | POA: Diagnosis not present

## 2024-03-16 LAB — CUP PACEART REMOTE DEVICE CHECK
Battery Remaining Longevity: 127 mo
Battery Voltage: 3.02 V
Brady Statistic AP VP Percent: 0.07 %
Brady Statistic AP VS Percent: 98.07 %
Brady Statistic AS VP Percent: 0.01 %
Brady Statistic AS VS Percent: 1.85 %
Brady Statistic RA Percent Paced: 97.2 %
Brady Statistic RV Percent Paced: 0.1 %
Date Time Interrogation Session: 20250528031037
Implantable Lead Connection Status: 753985
Implantable Lead Connection Status: 753985
Implantable Lead Implant Date: 20220131
Implantable Lead Implant Date: 20220131
Implantable Lead Location: 753859
Implantable Lead Location: 753860
Implantable Lead Model: 5076
Implantable Lead Model: 5076
Implantable Pulse Generator Implant Date: 20220131
Lead Channel Impedance Value: 285 Ohm
Lead Channel Impedance Value: 285 Ohm
Lead Channel Impedance Value: 323 Ohm
Lead Channel Impedance Value: 342 Ohm
Lead Channel Pacing Threshold Amplitude: 0.625 V
Lead Channel Pacing Threshold Amplitude: 0.875 V
Lead Channel Pacing Threshold Pulse Width: 0.4 ms
Lead Channel Pacing Threshold Pulse Width: 0.4 ms
Lead Channel Sensing Intrinsic Amplitude: 1.375 mV
Lead Channel Sensing Intrinsic Amplitude: 1.375 mV
Lead Channel Sensing Intrinsic Amplitude: 4.375 mV
Lead Channel Sensing Intrinsic Amplitude: 4.375 mV
Lead Channel Setting Pacing Amplitude: 1.5 V
Lead Channel Setting Pacing Amplitude: 2 V
Lead Channel Setting Pacing Pulse Width: 0.4 ms
Lead Channel Setting Sensing Sensitivity: 0.9 mV
Zone Setting Status: 755011
Zone Setting Status: 755011

## 2024-03-18 ENCOUNTER — Ambulatory Visit: Payer: Self-pay | Admitting: Internal Medicine

## 2024-05-06 NOTE — Progress Notes (Signed)
 Remote pacemaker transmission.

## 2024-05-12 ENCOUNTER — Other Ambulatory Visit (HOSPITAL_COMMUNITY): Payer: Self-pay | Admitting: *Deleted

## 2024-05-12 MED ORDER — CARVEDILOL 3.125 MG PO TABS: ORAL_TABLET | ORAL | 2 refills | Status: DC

## 2024-06-15 ENCOUNTER — Ambulatory Visit (INDEPENDENT_AMBULATORY_CARE_PROVIDER_SITE_OTHER): Payer: Medicare Other

## 2024-06-15 DIAGNOSIS — I495 Sick sinus syndrome: Secondary | ICD-10-CM

## 2024-06-16 ENCOUNTER — Ambulatory Visit: Payer: Self-pay | Admitting: Internal Medicine

## 2024-06-16 LAB — CUP PACEART REMOTE DEVICE CHECK
Battery Remaining Longevity: 123 mo
Battery Voltage: 3.01 V
Brady Statistic AP VP Percent: 0.06 %
Brady Statistic AP VS Percent: 99.22 %
Brady Statistic AS VP Percent: 0 %
Brady Statistic AS VS Percent: 0.71 %
Brady Statistic RA Percent Paced: 98.98 %
Brady Statistic RV Percent Paced: 0.07 %
Date Time Interrogation Session: 20250826220954
Implantable Lead Connection Status: 753985
Implantable Lead Connection Status: 753985
Implantable Lead Implant Date: 20220131
Implantable Lead Implant Date: 20220131
Implantable Lead Location: 753859
Implantable Lead Location: 753860
Implantable Lead Model: 5076
Implantable Lead Model: 5076
Implantable Pulse Generator Implant Date: 20220131
Lead Channel Impedance Value: 285 Ohm
Lead Channel Impedance Value: 285 Ohm
Lead Channel Impedance Value: 323 Ohm
Lead Channel Impedance Value: 342 Ohm
Lead Channel Pacing Threshold Amplitude: 0.75 V
Lead Channel Pacing Threshold Amplitude: 0.75 V
Lead Channel Pacing Threshold Pulse Width: 0.4 ms
Lead Channel Pacing Threshold Pulse Width: 0.4 ms
Lead Channel Sensing Intrinsic Amplitude: 1.25 mV
Lead Channel Sensing Intrinsic Amplitude: 1.25 mV
Lead Channel Sensing Intrinsic Amplitude: 4.125 mV
Lead Channel Sensing Intrinsic Amplitude: 4.125 mV
Lead Channel Setting Pacing Amplitude: 1.5 V
Lead Channel Setting Pacing Amplitude: 2 V
Lead Channel Setting Pacing Pulse Width: 0.4 ms
Lead Channel Setting Sensing Sensitivity: 0.9 mV
Zone Setting Status: 755011
Zone Setting Status: 755011

## 2024-07-04 NOTE — Progress Notes (Signed)
 Remote PPM Transmission

## 2024-09-14 ENCOUNTER — Ambulatory Visit: Payer: Medicare Other

## 2024-09-14 DIAGNOSIS — I495 Sick sinus syndrome: Secondary | ICD-10-CM | POA: Diagnosis not present

## 2024-09-16 LAB — CUP PACEART REMOTE DEVICE CHECK
Battery Remaining Longevity: 120 mo
Battery Voltage: 3.01 V
Brady Statistic AP VP Percent: 0.07 %
Brady Statistic AP VS Percent: 99.6 %
Brady Statistic AS VP Percent: 0 %
Brady Statistic AS VS Percent: 0.33 %
Brady Statistic RA Percent Paced: 99.65 %
Brady Statistic RV Percent Paced: 0.07 %
Date Time Interrogation Session: 20251126021221
Implantable Lead Connection Status: 753985
Implantable Lead Connection Status: 753985
Implantable Lead Implant Date: 20220131
Implantable Lead Implant Date: 20220131
Implantable Lead Location: 753859
Implantable Lead Location: 753860
Implantable Lead Model: 5076
Implantable Lead Model: 5076
Implantable Pulse Generator Implant Date: 20220131
Lead Channel Impedance Value: 285 Ohm
Lead Channel Impedance Value: 285 Ohm
Lead Channel Impedance Value: 323 Ohm
Lead Channel Impedance Value: 342 Ohm
Lead Channel Pacing Threshold Amplitude: 0.625 V
Lead Channel Pacing Threshold Amplitude: 0.875 V
Lead Channel Pacing Threshold Pulse Width: 0.4 ms
Lead Channel Pacing Threshold Pulse Width: 0.4 ms
Lead Channel Sensing Intrinsic Amplitude: 1.75 mV
Lead Channel Sensing Intrinsic Amplitude: 1.75 mV
Lead Channel Sensing Intrinsic Amplitude: 4.125 mV
Lead Channel Sensing Intrinsic Amplitude: 4.125 mV
Lead Channel Setting Pacing Amplitude: 1.5 V
Lead Channel Setting Pacing Amplitude: 2 V
Lead Channel Setting Pacing Pulse Width: 0.4 ms
Lead Channel Setting Sensing Sensitivity: 0.9 mV
Zone Setting Status: 755011
Zone Setting Status: 755011

## 2024-09-20 NOTE — Progress Notes (Signed)
 Remote PPM Transmission

## 2024-09-22 ENCOUNTER — Ambulatory Visit: Payer: Self-pay | Admitting: Internal Medicine

## 2024-11-02 ENCOUNTER — Telehealth: Payer: Self-pay | Admitting: Internal Medicine

## 2024-11-02 NOTE — Telephone Encounter (Signed)
" °*  STAT* If patient is at the pharmacy, call can be transferred to refill team.   1. Which medications need to be refilled? (please list name of each medication and dose if known)   carvedilol  (COREG ) 3.125 MG tablet    2. Would you like to learn more about the convenience, safety, & potential cost savings by using the [facility]? no   3. Are you open to using the [facility] (Type [facility]. no   4. Which pharmacy/location (including street and city if local pharmacy) is medication to be sent to?  [facility] - [place], [place] - [address]    5. Do they need a 30 day or 90 day supply?   "

## 2024-11-09 MED ORDER — CARVEDILOL 3.125 MG PO TABS: ORAL_TABLET | ORAL | 2 refills | Status: AC

## 2024-11-09 NOTE — Telephone Encounter (Signed)
 Refill sent to pharmacy. Future refills should be forwarded to EP following device.

## 2024-12-14 ENCOUNTER — Ambulatory Visit

## 2025-03-15 ENCOUNTER — Ambulatory Visit

## 2025-06-14 ENCOUNTER — Ambulatory Visit

## 2025-09-13 ENCOUNTER — Ambulatory Visit

## 2025-12-13 ENCOUNTER — Ambulatory Visit
